# Patient Record
Sex: Male | Born: 1955 | Race: White | Hispanic: No | Marital: Single | Smoking: Former smoker
Health system: Southern US, Community
[De-identification: ages and names within clinical notes are randomized; demographics above are authoritative.]

## PROBLEM LIST (undated history)

## (undated) DIAGNOSIS — H269 Unspecified cataract: Secondary | ICD-10-CM

## (undated) DIAGNOSIS — E785 Hyperlipidemia, unspecified: Secondary | ICD-10-CM

## (undated) DIAGNOSIS — F419 Anxiety disorder, unspecified: Secondary | ICD-10-CM

## (undated) DIAGNOSIS — M4800 Spinal stenosis, site unspecified: Secondary | ICD-10-CM

## (undated) DIAGNOSIS — M5126 Other intervertebral disc displacement, lumbar region: Secondary | ICD-10-CM

## (undated) DIAGNOSIS — F32A Depression, unspecified: Secondary | ICD-10-CM

## (undated) DIAGNOSIS — T7840XA Allergy, unspecified, initial encounter: Secondary | ICD-10-CM

## (undated) DIAGNOSIS — I10 Essential (primary) hypertension: Secondary | ICD-10-CM

## (undated) DIAGNOSIS — K219 Gastro-esophageal reflux disease without esophagitis: Secondary | ICD-10-CM

## (undated) DIAGNOSIS — N400 Enlarged prostate without lower urinary tract symptoms: Secondary | ICD-10-CM

## (undated) DIAGNOSIS — M51369 Other intervertebral disc degeneration, lumbar region without mention of lumbar back pain or lower extremity pain: Secondary | ICD-10-CM

## (undated) DIAGNOSIS — F329 Major depressive disorder, single episode, unspecified: Secondary | ICD-10-CM

## (undated) DIAGNOSIS — K635 Polyp of colon: Secondary | ICD-10-CM

## (undated) DIAGNOSIS — M5136 Other intervertebral disc degeneration, lumbar region: Secondary | ICD-10-CM

## (undated) HISTORY — PX: WISDOM TOOTH EXTRACTION: SHX21

## (undated) HISTORY — PX: COLONOSCOPY: SHX174

## (undated) HISTORY — DX: Anxiety disorder, unspecified: F41.9

## (undated) HISTORY — DX: Allergy, unspecified, initial encounter: T78.40XA

## (undated) HISTORY — DX: Polyp of colon: K63.5

## (undated) HISTORY — DX: Unspecified cataract: H26.9

## (undated) HISTORY — PX: TYMPANIC MEMBRANE REPAIR: SHX294

## (undated) HISTORY — DX: Other intervertebral disc degeneration, lumbar region without mention of lumbar back pain or lower extremity pain: M51.369

## (undated) HISTORY — DX: Gastro-esophageal reflux disease without esophagitis: K21.9

## (undated) HISTORY — DX: Spinal stenosis, site unspecified: M48.00

## (undated) HISTORY — DX: Other intervertebral disc displacement, lumbar region: M51.26

## (undated) HISTORY — DX: Hyperlipidemia, unspecified: E78.5

## (undated) HISTORY — DX: Benign prostatic hyperplasia without lower urinary tract symptoms: N40.0

## (undated) HISTORY — DX: Other intervertebral disc degeneration, lumbar region: M51.36

## (undated) HISTORY — PX: EYE SURGERY: SHX253

## (undated) HISTORY — DX: Major depressive disorder, single episode, unspecified: F32.9

## (undated) HISTORY — DX: Essential (primary) hypertension: I10

## (undated) HISTORY — DX: Depression, unspecified: F32.A

## (undated) HISTORY — PX: REFRACTIVE SURGERY: SHX103

## (undated) HISTORY — PX: FRACTURE SURGERY: SHX138

---
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
## 2000-02-16 ENCOUNTER — Encounter: Admission: RE | Admit: 2000-02-16 | Discharge: 2000-02-16 | Payer: Self-pay | Admitting: *Deleted

## 2000-08-16 ENCOUNTER — Encounter: Admission: RE | Admit: 2000-08-16 | Discharge: 2000-08-16 | Payer: Self-pay

## 2003-12-31 ENCOUNTER — Ambulatory Visit: Payer: Self-pay | Admitting: Family Medicine

## 2004-11-29 ENCOUNTER — Ambulatory Visit: Payer: Self-pay | Admitting: Family Medicine

## 2004-12-08 ENCOUNTER — Ambulatory Visit: Payer: Self-pay | Admitting: Family Medicine

## 2005-12-21 ENCOUNTER — Ambulatory Visit: Payer: Self-pay | Admitting: Family Medicine

## 2005-12-21 LAB — CONVERTED CEMR LAB
ALT: 37 units/L (ref 0–40)
AST: 29 units/L (ref 0–37)
Albumin: 4.3 g/dL (ref 3.5–5.2)
Alkaline Phosphatase: 72 units/L (ref 39–117)
BUN: 12 mg/dL (ref 6–23)
Basophils Absolute: 0 10*3/uL (ref 0.0–0.1)
Basophils Relative: 0.6 % (ref 0.0–1.0)
CO2: 31 meq/L (ref 19–32)
Calcium: 9.7 mg/dL (ref 8.4–10.5)
Chloride: 101 meq/L (ref 96–112)
Chol/HDL Ratio, serum: 4.9
Cholesterol: 228 mg/dL (ref 0–200)
Creatinine, Ser: 1.1 mg/dL (ref 0.4–1.5)
Eosinophil percent: 1.9 % (ref 0.0–5.0)
GFR calc non Af Amer: 75 mL/min
Glomerular Filtration Rate, Af Am: 90 mL/min/{1.73_m2}
Glucose, Bld: 99 mg/dL (ref 70–99)
HCT: 45.7 % (ref 39.0–52.0)
HDL: 47 mg/dL (ref >39.0)
Hemoglobin: 15.7 g/dL (ref 13.0–17.0)
LDL DIRECT: 146.7 mg/dL
Lymphocytes Relative: 38.1 % (ref 12.0–46.0)
MCHC: 34.4 g/dL (ref 30.0–36.0)
MCV: 95.6 fL (ref 78.0–100.0)
Monocytes Absolute: 0.5 10*3/uL (ref 0.2–0.7)
Monocytes Relative: 9.6 % (ref 3.0–11.0)
Neutro Abs: 2.5 10*3/uL (ref 1.4–7.7)
Neutrophils Relative %: 49.8 % (ref 43.0–77.0)
PSA: 0.82 ng/mL (ref 0.10–4.00)
Platelets: 254 10*3/uL (ref 150–400)
Potassium: 3.7 meq/L (ref 3.5–5.1)
RBC: 4.78 M/uL (ref 4.22–5.81)
RDW: 12.4 % (ref 11.5–14.6)
Sodium: 142 meq/L (ref 135–145)
TSH: 1.46 microintl units/mL (ref 0.35–5.50)
Total Bilirubin: 1.1 mg/dL (ref 0.3–1.2)
Total Protein: 7.8 g/dL (ref 6.0–8.3)
Triglyceride fasting, serum: 251 mg/dL (ref 0–149)
VLDL: 50 mg/dL — ABNORMAL HIGH (ref 0–40)
WBC: 5 10*3/uL (ref 4.5–10.5)

## 2005-12-28 ENCOUNTER — Ambulatory Visit: Payer: Self-pay | Admitting: Family Medicine

## 2006-01-18 ENCOUNTER — Ambulatory Visit: Payer: Self-pay | Admitting: Family Medicine

## 2006-09-27 DIAGNOSIS — E785 Hyperlipidemia, unspecified: Secondary | ICD-10-CM | POA: Insufficient documentation

## 2006-09-27 DIAGNOSIS — F329 Major depressive disorder, single episode, unspecified: Secondary | ICD-10-CM

## 2006-09-27 DIAGNOSIS — F32A Depression, unspecified: Secondary | ICD-10-CM | POA: Insufficient documentation

## 2006-09-27 DIAGNOSIS — K219 Gastro-esophageal reflux disease without esophagitis: Secondary | ICD-10-CM | POA: Insufficient documentation

## 2006-09-27 DIAGNOSIS — F419 Anxiety disorder, unspecified: Secondary | ICD-10-CM | POA: Insufficient documentation

## 2006-09-27 DIAGNOSIS — I10 Essential (primary) hypertension: Secondary | ICD-10-CM | POA: Insufficient documentation

## 2006-12-27 ENCOUNTER — Ambulatory Visit: Payer: Self-pay | Admitting: Family Medicine

## 2006-12-27 LAB — CONVERTED CEMR LAB
Bilirubin Urine: NEGATIVE
Blood in Urine, dipstick: NEGATIVE
Glucose, Urine, Semiquant: NEGATIVE
Ketones, urine, test strip: NEGATIVE
Nitrite: NEGATIVE
Specific Gravity, Urine: 1.015
Urobilinogen, UA: 0.2
WBC Urine, dipstick: NEGATIVE
pH: 6

## 2007-01-04 ENCOUNTER — Ambulatory Visit: Payer: Self-pay | Admitting: Family Medicine

## 2007-01-04 DIAGNOSIS — F528 Other sexual dysfunction not due to a substance or known physiological condition: Secondary | ICD-10-CM | POA: Insufficient documentation

## 2007-01-08 LAB — CONVERTED CEMR LAB
ALT: 29 units/L (ref 0–53)
AST: 23 units/L (ref 0–37)
Albumin: 4 g/dL (ref 3.5–5.2)
Alkaline Phosphatase: 73 units/L (ref 39–117)
BUN: 13 mg/dL (ref 6–23)
Basophils Absolute: 0 10*3/uL (ref 0.0–0.1)
Basophils Relative: 0.5 % (ref 0.0–1.0)
Bilirubin, Direct: 0.2 mg/dL (ref 0.0–0.3)
CO2: 31 meq/L (ref 19–32)
Calcium: 9.7 mg/dL (ref 8.4–10.5)
Chloride: 99 meq/L (ref 96–112)
Cholesterol: 208 mg/dL (ref 0–200)
Creatinine, Ser: 1 mg/dL (ref 0.4–1.5)
Direct LDL: 140.6 mg/dL
Eosinophils Absolute: 0.2 10*3/uL (ref 0.0–0.6)
Eosinophils Relative: 3.4 % (ref 0.0–5.0)
GFR calc Af Amer: 101 mL/min
GFR calc non Af Amer: 83 mL/min
Glucose, Bld: 101 mg/dL — ABNORMAL HIGH (ref 70–99)
HCT: 43.3 % (ref 39.0–52.0)
HDL: 36.2 mg/dL — ABNORMAL LOW (ref >39.0)
Hemoglobin: 15.2 g/dL (ref 13.0–17.0)
Lymphocytes Relative: 36 % (ref 12.0–46.0)
MCHC: 35.1 g/dL (ref 30.0–36.0)
MCV: 95 fL (ref 78.0–100.0)
Monocytes Absolute: 0.6 10*3/uL (ref 0.2–0.7)
Monocytes Relative: 8.1 % (ref 3.0–11.0)
Neutro Abs: 3.7 10*3/uL (ref 1.4–7.7)
Neutrophils Relative %: 52 % (ref 43.0–77.0)
PSA: 0.96 ng/mL (ref 0.10–4.00)
Platelets: 266 10*3/uL (ref 150–400)
Potassium: 4 meq/L (ref 3.5–5.1)
RBC: 4.56 M/uL (ref 4.22–5.81)
RDW: 12.3 % (ref 11.5–14.6)
Sodium: 140 meq/L (ref 135–145)
TSH: 1.49 microintl units/mL (ref 0.35–5.50)
Total Bilirubin: 1.1 mg/dL (ref 0.3–1.2)
Total CHOL/HDL Ratio: 5.7
Total Protein: 6.8 g/dL (ref 6.0–8.3)
Triglycerides: 126 mg/dL (ref 0–149)
VLDL: 25 mg/dL (ref 0–40)
WBC: 7.1 10*3/uL (ref 4.5–10.5)

## 2007-03-14 ENCOUNTER — Ambulatory Visit: Payer: Self-pay | Admitting: Family Medicine

## 2007-03-14 DIAGNOSIS — K921 Melena: Secondary | ICD-10-CM | POA: Insufficient documentation

## 2007-03-22 ENCOUNTER — Encounter: Payer: Self-pay | Admitting: Family Medicine

## 2007-09-20 ENCOUNTER — Emergency Department (HOSPITAL_COMMUNITY): Admission: EM | Admit: 2007-09-20 | Discharge: 2007-09-20 | Payer: Self-pay | Admitting: Emergency Medicine

## 2007-10-04 ENCOUNTER — Ambulatory Visit: Payer: Self-pay | Admitting: Family Medicine

## 2007-10-04 DIAGNOSIS — F411 Generalized anxiety disorder: Secondary | ICD-10-CM | POA: Insufficient documentation

## 2007-10-18 ENCOUNTER — Ambulatory Visit: Payer: Self-pay | Admitting: Family Medicine

## 2007-10-29 ENCOUNTER — Telehealth: Payer: Self-pay | Admitting: Family Medicine

## 2007-10-29 DIAGNOSIS — S335XXA Sprain of ligaments of lumbar spine, initial encounter: Secondary | ICD-10-CM | POA: Insufficient documentation

## 2007-11-22 ENCOUNTER — Telehealth: Payer: Self-pay | Admitting: Family Medicine

## 2007-11-26 ENCOUNTER — Telehealth (INDEPENDENT_AMBULATORY_CARE_PROVIDER_SITE_OTHER): Payer: Self-pay | Admitting: *Deleted

## 2007-12-05 ENCOUNTER — Encounter: Payer: Self-pay | Admitting: Family Medicine

## 2008-01-08 ENCOUNTER — Ambulatory Visit: Payer: Self-pay | Admitting: Family Medicine

## 2008-01-08 LAB — CONVERTED CEMR LAB
Blood in Urine, dipstick: NEGATIVE
Ketones, urine, test strip: NEGATIVE
Nitrite: NEGATIVE
Protein, U semiquant: NEGATIVE
Specific Gravity, Urine: 1.02
Urobilinogen, UA: 0.2

## 2008-01-09 ENCOUNTER — Encounter: Admission: RE | Admit: 2008-01-09 | Discharge: 2008-01-29 | Payer: Self-pay | Admitting: Specialist

## 2008-01-17 ENCOUNTER — Ambulatory Visit: Payer: Self-pay | Admitting: Family Medicine

## 2008-01-21 LAB — CONVERTED CEMR LAB
AST: 35 units/L (ref 0–37)
Alkaline Phosphatase: 67 units/L (ref 39–117)
BUN: 9 mg/dL (ref 6–23)
Basophils Absolute: 0 10*3/uL (ref 0.0–0.1)
Chloride: 105 meq/L (ref 96–112)
Eosinophils Absolute: 0.2 10*3/uL (ref 0.0–0.7)
Eosinophils Relative: 3.1 % (ref 0.0–5.0)
GFR calc non Af Amer: 93 mL/min
HDL: 46.4 mg/dL (ref >39.0)
MCV: 95.2 fL (ref 78.0–100.0)
Neutrophils Relative %: 47.8 % (ref 43.0–77.0)
Platelets: 221 10*3/uL (ref 150–400)
Potassium: 3.6 meq/L (ref 3.5–5.1)
Total Bilirubin: 0.9 mg/dL (ref 0.3–1.2)
Total CHOL/HDL Ratio: 4.7
Triglycerides: 217 mg/dL (ref 0–149)
VLDL: 43 mg/dL — ABNORMAL HIGH (ref 0–40)
WBC: 5.4 10*3/uL (ref 4.5–10.5)

## 2008-02-12 ENCOUNTER — Encounter: Admission: RE | Admit: 2008-02-12 | Discharge: 2008-03-26 | Payer: Self-pay | Admitting: Specialist

## 2008-02-21 ENCOUNTER — Encounter: Payer: Self-pay | Admitting: Family Medicine

## 2008-12-09 ENCOUNTER — Telehealth: Payer: Self-pay | Admitting: Family Medicine

## 2009-02-03 ENCOUNTER — Ambulatory Visit: Payer: Self-pay | Admitting: Family Medicine

## 2009-02-11 ENCOUNTER — Ambulatory Visit: Payer: Self-pay | Admitting: Family Medicine

## 2009-02-11 DIAGNOSIS — B354 Tinea corporis: Secondary | ICD-10-CM | POA: Insufficient documentation

## 2009-02-17 LAB — CONVERTED CEMR LAB
ALT: 36 units/L (ref 0–53)
Alkaline Phosphatase: 76 units/L (ref 39–117)
Basophils Relative: 0.8 % (ref 0.0–3.0)
Bilirubin Urine: NEGATIVE
Bilirubin, Direct: 0.2 mg/dL (ref 0.0–0.3)
CO2: 32 meq/L (ref 19–32)
Chloride: 102 meq/L (ref 96–112)
Eosinophils Absolute: 0.3 10*3/uL (ref 0.0–0.7)
Eosinophils Relative: 4 % (ref 0.0–5.0)
Glucose, Bld: 115 mg/dL — ABNORMAL HIGH (ref 70–99)
HDL: 49.3 mg/dL (ref >39.00)
Lymphocytes Relative: 35.7 % (ref 12.0–46.0)
Neutrophils Relative %: 51.1 % (ref 43.0–77.0)
Nitrite: NEGATIVE
Potassium: 4.2 meq/L (ref 3.5–5.1)
RBC: 4.99 M/uL (ref 4.22–5.81)
Sodium: 142 meq/L (ref 135–145)
Specific Gravity, Urine: 1.02 (ref 1.000–1.030)
Total Bilirubin: 1.2 mg/dL (ref 0.3–1.2)
Total Protein: 7.9 g/dL (ref 6.0–8.3)
VLDL: 24.2 mg/dL (ref 0.0–40.0)
WBC: 7 10*3/uL (ref 4.5–10.5)
pH: 6 (ref 5.0–8.0)

## 2009-02-25 ENCOUNTER — Ambulatory Visit: Payer: Self-pay | Admitting: Family Medicine

## 2009-02-25 LAB — CONVERTED CEMR LAB
OCCULT 1: NEGATIVE
OCCULT 2: NEGATIVE
OCCULT 3: NEGATIVE

## 2009-03-10 ENCOUNTER — Telehealth: Payer: Self-pay | Admitting: Family Medicine

## 2009-03-26 ENCOUNTER — Encounter: Payer: Self-pay | Admitting: Family Medicine

## 2009-06-30 ENCOUNTER — Telehealth: Payer: Self-pay | Admitting: Family Medicine

## 2009-07-13 ENCOUNTER — Ambulatory Visit: Payer: Self-pay | Admitting: Family Medicine

## 2009-08-12 ENCOUNTER — Telehealth: Payer: Self-pay | Admitting: Family Medicine

## 2009-08-16 LAB — CONVERTED CEMR LAB
Direct LDL: 115.8 mg/dL
HDL: 51.6 mg/dL (ref >39.00)

## 2009-09-13 ENCOUNTER — Ambulatory Visit: Payer: Self-pay | Admitting: Diagnostic Radiology

## 2009-09-13 ENCOUNTER — Emergency Department (HOSPITAL_BASED_OUTPATIENT_CLINIC_OR_DEPARTMENT_OTHER): Admission: EM | Admit: 2009-09-13 | Discharge: 2009-09-13 | Payer: Self-pay | Admitting: Emergency Medicine

## 2010-01-13 ENCOUNTER — Telehealth: Payer: Self-pay | Admitting: Family Medicine

## 2010-01-21 ENCOUNTER — Telehealth: Payer: Self-pay | Admitting: Family Medicine

## 2010-02-23 ENCOUNTER — Telehealth: Payer: Self-pay | Admitting: Family Medicine

## 2010-03-02 NOTE — Assessment & Plan Note (Signed)
Summary: cpx/cjr   Vital Signs:  Patient profile:   55 year old male Height:      71 inches Weight:      238 pounds BMI:     33.31 O2 Sat:      96 % Temp:     98.1 degrees F Pulse rate:   80 / minute BP sitting:   120 / 80  (left arm) Cuff size:   large  Vitals Entered By: Pura Spice, RN (February 11, 2009 10:35 AM) CC: cpx  Is Patient Diabetic? No Pain Assessment Patient in pain? no        History of Present Illness: This 55 year old white male is in for complete physical examination.Marland Kitchen He is a known hypertensive which is well controlled../In general he relates he had been doing for well and does have a circular rash scaly ringworm type rash over the right anterior chest. 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 He relates his anxiety depression which is associated with his dizziness is under control with the bupropion and alprazolam. He stopped his simvastatin because it was causing muscular aches and pains and after stopping the medication and when away and we will change to another medication  Allergies (verified): No Known Drug Allergies  Past History:  Past Medical History: Last updated: 09/27/2006 Depression GERD Hyperlipidemia Hypertension  Past Surgical History: Last updated: 09/27/2006 Broken nose-1974  Review of Systems      See HPI General:  See HPI; Denies chills, fatigue, fever, loss of appetite, malaise, sleep disorder, sweats, weakness, and weight loss. Eyes:  Denies blurring, discharge, double vision, eye irritation, eye pain, halos, itching, light sensitivity, red eye, vision loss-1 eye, and vision loss-both eyes. ENT:  Denies decreased hearing, difficulty swallowing, ear discharge, earache, hoarseness, nasal congestion, nosebleeds, postnasal drainage, ringing in ears, sinus pressure, and sore throat. CV:  Denies bluish discoloration of lips  or nails, chest pain or discomfort, difficulty breathing at night, difficulty breathing while lying down, fainting, fatigue, leg cramps with exertion, lightheadness, near fainting, palpitations, shortness of breath with exertion, swelling of feet, swelling of hands, and weight gain; blood pressure is stable. Resp:  Denies chest discomfort, chest pain with inspiration, cough, coughing up blood, excessive snoring, hypersomnolence, morning headaches, pleuritic, shortness of breath, sputum productive, and wheezing. GI:  Complains of indigestion; denies abdominal pain, bloody stools, change in bowel habits, constipation, dark tarry stools, diarrhea, excessive appetite, gas, hemorrhoids, loss of appetite, nausea, vomiting, vomiting blood, and yellowish skin color; controll with omeprazole. GU:  Complains of erectile dysfunction. MS:  See HPI; Complains of muscle aches. Derm:  See HPI; Complains of rash. Neuro:  Denies brief paralysis, difficulty with concentration, disturbances in coordination, falling down, headaches, inability to speak, memory loss, numbness, poor balance, seizures, sensation of room spinning, tingling, tremors, visual disturbances, and weakness. Psych:  Complains of anxiety and depression. Endo:  Denies cold intolerance, excessive hunger, excessive thirst, excessive urination, heat intolerance, polyuria, and weight change.  Physical Exam  General:  Well-developed,well-nourished,in no acute distress; alert,appropriate and cooperative throughout examination Head:  Normocephalic and atraumatic without obvious abnormalities. No apparent alopecia or balding. Eyes:  No corneal or conjunctival inflammation noted. EOMI. Perrla. Funduscopic exam benign, without hemorrhages, exudates or papilledema. Vision grossly normal. Ears:  External ear exam shows no significant lesions or deformities.  Otoscopic examination reveals clear canals, tympanic membranes  are intact bilaterally without bulging,  retraction, inflammation or discharge. Hearing is grossly normal bilaterally. Nose:  External nasal examination shows no deformity or inflammation. Nasal mucosa are pink and moist without lesions or exudates. Mouth:  Oral mucosa and oropharynx without lesions or exudates.  Teeth in good repair. Neck:  No deformities, masses, or tenderness noted. Chest Wall:  No deformities, masses, tenderness or gynecomastia noted. Breasts:  No masses or gynecomastia noted Lungs:  Normal respiratory effort, chest expands symmetrically. Lungs are clear to auscultation, no crackles or wheezes. Heart:  Normal rate and regular rhythm. S1 and S2 normal without gallop, murmur, click, rub or other extra sounds. electrocardiogram is normal Abdomen:  Bowel sounds positive,abdomen soft and non-tender without masses, organomegaly or hernias noted. Rectal:  No external abnormalities noted. Normal sphincter tone. No rectal masses or tenderness. Genitalia:  Testes bilaterally descended without nodularity, tenderness or masses. No scrotal masses or lesions. No penis lesions or urethral discharge. Prostate:  Prostate gland firm and smooth, no enlargement, nodularity, tenderness, mass, asymmetry or induration. Msk:  No deformity or scoliosis noted of thoracic or lumbar spine.   Pulses:  R and L carotid,radial,femoral,dorsalis pedis and posterior tibial pulses are full and equal bilaterally Extremities:  No clubbing, cyanosis, edema, or deformity noted with normal full range of motion of all joints.   Neurologic:  No cranial nerve deficits noted. Station and gait are normal. Plantar reflexes are down-going bilaterally. DTRs are symmetrical throughout. Sensory, motor and coordinative functions appear intact. Skin:  2 cm circular erythematous scaly rash right anterior chest Cervical Nodes:  No lymphadenopathy noted Axillary Nodes:  No palpable lymphadenopathy Inguinal Nodes:  No significant adenopathy Psych:  Cognition and judgment  appear intact. Alert and cooperative with normal attention span and concentration. 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  Orders: Prescription Created Electronically [phone])  Problem # 3:  STRESS DISORDER (ICD-V62.89) Assessment: Improved  Problem # 4:  ERECTILE DYSFUNCTION (ICD-302.72) Assessment: Improved  His updated medication list for this problem includes:    Viagra 100 Mg Tabs (Sildenafil citrate) .Marland Kitchen... Take as directed  Problem # 5:  HYPERTENSION (ICD-401.9) Assessment: Improved  His updated medication list for this problem includes:    Triamterene-hctz 37.5-25 Mg Tabs (Triamterene-hctz) ..... Once daily    Bystolic 10 Mg Tabs (Nebivolol hcl) .Marland Kitchen... 1 once daily for blood pressure  Orders: EKG w/ Interpretation (93000)  Problem # 6:  HYPERLIPIDEMIA (ICD-272.4) Assessment: Improved  The following medications were removed from the medication list:    Simvastatin 80 Mg Tabs (Simvastatin) .Marland Kitchen... 1 hs for lipids His updated medication list for this problem includes:    Crestor 10 Mg Tabs (Rosuvastatin calcium) ..... One q.d.  Orders: EKG w/ Interpretation (93000)  Problem # 7:  GERD (ICD-530.81) Assessment: Improved  His updated medication list for this problem includes:    Kls Omeprazole 20 Mg Tbec (Omeprazole) .Marland Kitchen... 1 qd  Complete Medication List: 1)  Triamterene-hctz 37.5-25 Mg Tabs (Triamterene-hctz) .... Once daily 2)  Viagra 100 Mg Tabs (Sildenafil citrate) .... Take as directed 3)  Budeprion Xl 300 Mg Tb24 (Bupropion hcl) .... Once daily 4)  Alprazolam 0.5 Mg Tabs (Alprazolam) .Marland Kitchen.. 1 morning ,mid afternoon and bedtime for stress 5)  Bystolic 10 Mg Tabs (Nebivolol hcl) .Marland Kitchen.. 1 once daily for blood pressure 6)  Oxycodone-acetaminophen 5-325 Mg Tabs  (Oxycodone-acetaminophen) .Marland KitchenMarland KitchenMarland Kitchen  1-2 q4h  as needed pain 7)  Kls Omeprazole 20 Mg Tbec (Omeprazole) .Marland Kitchen.. 1 qd 8)  Adult Aspirin Ec Low Strength 81 Mg Tbec (Aspirin) .Marland Kitchen.. 1 qd 9)  Clotrimazole-betamethasone 1-0.05 % Crea (Clotrimazole-betamethasone) .... Apply two times a day to rash 10)  Crestor 10 Mg Tabs (Rosuvastatin calcium) .... One q.d.  Other Orders: Admin 1st Vaccine (06301) Flu Vaccine 66yrs + (60109) Flu Vaccine Consent Questions     Do you have a history of severe allergic reactions to this vaccine? no    Any prior history of allergic reactions to egg and/or gelatin? no    Do you have a sensitivity to the preservative Thimersol? no    Do you have a past history of Guillan-Barre Syndrome? no    Do you currently have an acute febrile illness? no    Have you ever had a severe reaction to latex? no    Vaccine information given and explained to patient? yes    Are you currently pregnant? no    Lot Number:[id number]   Exp Date:07/30/2009   Site Given  right  Deltoid IM.....ghrn            Patient Instructions: 1)  recommend attempting to lose weight, in fact you gained 4 pounds since the last office visi Take medications as prescribed 2)  D. simvastatin causes muscle aches would change to Crestor or Lipitor and recheck lipids in 3 months 3)  For fungal infection of the chest area we'll treat with Lotrisone  4)  Twice daily for 2 weeks, repeat if recurs Prescriptions: OXYCODONE-ACETAMINOPHEN 5-325 MG TABS (OXYCODONE-ACETAMINOPHEN) 1-2 q4h  as needed pain  #100 x 0   Entered and Authorized by:   Judithann Sheen MD   Signed by:   Judithann Sheen MD on 02/11/2009   Method used:   Print then Give to Patient   RxID:   [id number] ALPRAZOLAM 0.5 MG TABS (ALPRAZOLAM) 1 morning ,mid afternoon and bedtime for stress  #90 x 5   Entered and Authorized by:   Judithann Sheen MD   Signed by:   Judithann Sheen MD on 02/11/2009   Method used:   Print then Give to  Patient   RxID:   [id number] CLOTRIMAZOLE-BETAMETHASONE 1-0.05 % CREA (CLOTRIMAZOLE-BETAMETHASONE) APPLY two times a day TO RASH  #45 GMS x 5   Entered and Authorized by:   Judithann Sheen MD   Signed by:   Judithann Sheen MD on 02/11/2009   Method used:   Electronically to        [facility] (retail)       [address]       [place], [place]  [address]       Ph: [phone]       Fax: [phone]   RxID:   [id number] BYSTOLIC 10 MG TABS (NEBIVOLOL HCL) 1 once daily for blood pressure Brand medically necessary #30 x 11   Entered and Authorized by:   Judithann Sheen MD   Signed by:   Judithann Sheen MD on 02/11/2009   Method used:   Electronically to        [facility] (retail)       [address]       [place], [place]  [address]       Ph:  [phone]       Fax: [phone]   RxID:   [id number] BUDEPRION XL 300 MG  TB24 (BUPROPION HCL) once daily  #30 x 11   Entered and Authorized by:   Judithann Sheen MD   Signed by:   Judithann Sheen MD on 02/11/2009   Method used:   Electronically to        [facility] (retail)       [address]       [place], [place]  [address]       Ph: [phone]       Fax: [phone]   RxID:   [id number] VIAGRA 100 MG  TABS (SILDENAFIL CITRATE) take as directed  #10 x 6   Entered and Authorized by:   Judithann Sheen MD   Signed by:   Judithann Sheen MD on 02/11/2009   Method used:   Electronically to        [facility] (retail)       [address]       [place], [place]  [address]       Ph: [phone]       Fax: [phone]   RxID:   [id number] TRIAMTERENE-HCTZ 37.5-25 MG TABS (TRIAMTERENE-HCTZ) once daily  #30 x 11   Entered and Authorized by:   Judithann Sheen MD   Signed by:   Judithann Sheen MD on  02/11/2009   Method used:   Electronically to        [facility] (retail)       [address]       [place], [place]  [address]       Ph: [phone]       Fax: [phone]   RxID:   [id number]   .lbflu

## 2010-03-02 NOTE — Progress Notes (Signed)
Summary: change from traimtere to HCTZ until triamtere returns   Phone Note From Pharmacy   Caller: [facility] Summary of Call: fax repoert received to say that traimtere /hctz having difficulty in delays of production.  need alternative med  Initial call taken by: Pura Spice, RN,  Jun 30, 2009 3:32 PM  Follow-up for Phone Call        per dr Scotty Court start on hctz 25 mg until able to get triamtere/hctz  Follow-up by: Pura Spice, RN,  Jun 30, 2009 3:33 PM    New/Updated Medications: HYDROCHLOROTHIAZIDE 25 MG TABS (HYDROCHLOROTHIAZIDE) 1 by mouth every day Prescriptions: HYDROCHLOROTHIAZIDE 25 MG TABS (HYDROCHLOROTHIAZIDE) 1 by mouth every day  #3066 x 6   Entered by:   Pura Spice, RN   Authorized by:   Judithann Sheen MD   Signed by:   Pura Spice, RN on 06/30/2009   Method used:   Printed then faxed to ...       [facility] (retail)       [address]       [place], [place]  [address]       Ph: [phone]       Fax: [phone]   RxID:   [id number]

## 2010-03-02 NOTE — Progress Notes (Signed)
Summary: LAB RESULTS  Phone Note Call from Patient Call back at Home Phone [phone]   Caller: Patient Call For: Judithann Sheen MD Summary of Call: PT WOULD LIKE LAB RESULTS Initial call taken by: Heron Sabins,  August 12, 2009 12:22 PM  Follow-up for Phone Call        per dr Scotty Court labs "looked good"   pt notiifed.  Follow-up by: Pura Spice, RN,  August 13, 2009 9:10 AM

## 2010-03-02 NOTE — Procedures (Signed)
Summary: Colonoscopy Report/Dr. Sharrell Ku  Colonoscopy Report/Dr. Sharrell Ku   Imported By: Maryln Gottron 05/29/2009 14:24:29  _____________________________________________________________________  External Attachment:    Type:   Image     Comment:   External Document

## 2010-03-02 NOTE — Progress Notes (Signed)
Summary: questions samples crestor   Phone Note Call from Patient Call back at Home Phone [phone]   Caller: Patient via vm Call For: Judithann Sheen MD Summary of Call: has three things to address:  1. colon test results                                                2. out of samples for cholesterol med.  You were considering new lipitor.  Please call to pharmacy.                                                3. need clarification of 6 month FU--does he need lab and rov or just lab Please call. Initial call taken by: Gladis Riffle, RN,  March 10, 2009 1:17 PM  Follow-up for Phone Call        notified pt hemocult cards neg x 3 on crestor 10 mg since was given samples and he wants to cont. and to reck lipids hepatic 3 months . pt verbalized understanding  Follow-up by: Pura Spice, RN,  March 12, 2009 12:07 PM    New/Updated Medications: CRESTOR 10 MG TABS (ROSUVASTATIN CALCIUM) one q.d.

## 2010-03-04 NOTE — Progress Notes (Signed)
Summary: class 2 med  Phone Note From Pharmacy   Caller: [facility] Call For: rachel  Reason for Call: Allergy Alert Summary of Call: endocet can not be call into pharm class2. Initial call taken by: Heron Sabins,  January 21, 2010 12:12 PM  Follow-up for Phone Call        pt is aware and will pick up rx @ office. Follow-up by: Warnell Forester,  January 21, 2010 4:09 PM    Prescriptions: ENDOCET 5-325 MG TABS (OXYCODONE-ACETAMINOPHEN) take one tab by mouth three times a day as needed  #60 x 0   Entered by:   Kern Reap CMA (AAMA)   Authorized by:   Judithann Sheen MD   Signed by:   Kern Reap CMA Duncan Dull) on 01/21/2010   Method used:   Print then Give to Patient   RxID:   [id number]

## 2010-03-04 NOTE — Progress Notes (Signed)
Summary: rx request  Phone Note Call from Patient Call back at Home Phone [phone]   Caller: Patient Call For: Judithann Sheen MD Summary of Call: [facility] refill 5/[facility]   Initial call taken by: [facility] CMA AAMA,  January 13, 2010 4:22 PM  Follow-up for Phone Call        ok per dr Scotty Court Follow-up by: Kern Reap CMA Duncan Dull),  January 21, 2010 9:24 AM    New/Updated Medications: ENDOCET 5-325 MG TABS (OXYCODONE-ACETAMINOPHEN) take one tab by mouth three times a day as needed Prescriptions: ENDOCET 5-325 MG TABS (OXYCODONE-ACETAMINOPHEN) take one tab by mouth three times a day as needed  #60 x 0   Entered by:   Kern Reap CMA (AAMA)   Authorized by:   Judithann Sheen MD   Signed by:   Kern Reap CMA (AAMA) on 01/21/2010   Method used:   Telephoned to ...       [facility] (retail)       [address]       [place], [place]  [address]       Ph: [phone]       Fax: [phone]   RxID:   [id number]

## 2010-03-04 NOTE — Progress Notes (Signed)
Summary: refill request  Phone Note Refill Request Message from:  Fax from Pharmacy on February 23, 2010 3:00 PM  Refills Requested: Medication #1:  TRIAMTERENE-HCTZ 37.5-25 MG TABS once daily Initial call taken by: Kern Reap CMA (AAMA),  February 23, 2010 3:24 PM    Prescriptions: TRIAMTERENE-HCTZ 37.5-25 MG TABS (TRIAMTERENE-HCTZ) once daily  #30 x 5   Entered by:   Kern Reap CMA (AAMA)   Authorized by:   Judithann Sheen MD   Signed by:   Kern Reap CMA (AAMA) on 02/23/2010   Method used:   Re-Faxed to ...       [facility] (retail)       [address]       [place], [place]  [address]       Ph: [phone]       Fax: [phone]   RxID:   [id number]

## 2010-03-25 ENCOUNTER — Other Ambulatory Visit: Payer: Self-pay | Admitting: Family Medicine

## 2010-03-30 ENCOUNTER — Other Ambulatory Visit (INDEPENDENT_AMBULATORY_CARE_PROVIDER_SITE_OTHER): Payer: PRIVATE HEALTH INSURANCE | Admitting: Family Medicine

## 2010-03-30 DIAGNOSIS — Z0000 Encounter for general adult medical examination without abnormal findings: Secondary | ICD-10-CM

## 2010-03-30 LAB — LIPID PANEL
Cholesterol: 176 mg/dL (ref 0–200)
HDL: 46.6 mg/dL (ref >39.00)
LDL Cholesterol: 91 mg/dL (ref 0–99)
Triglycerides: 193 mg/dL — ABNORMAL HIGH (ref 0.0–149.0)
VLDL: 38.6 mg/dL (ref 0.0–40.0)

## 2010-03-30 LAB — HEPATIC FUNCTION PANEL
AST: 27 U/L (ref 0–37)
Total Bilirubin: 1 mg/dL (ref 0.3–1.2)

## 2010-03-30 LAB — CBC WITH DIFFERENTIAL/PLATELET
Basophils Absolute: 0.1 10*3/uL (ref 0.0–0.1)
Eosinophils Absolute: 0.3 10*3/uL (ref 0.0–0.7)
HCT: 45.9 % (ref 39.0–52.0)
Lymphs Abs: 2.7 10*3/uL (ref 0.7–4.0)
MCHC: 35.1 g/dL (ref 30.0–36.0)
Monocytes Relative: 7.2 % (ref 3.0–12.0)
Platelets: 231 10*3/uL (ref 150.0–400.0)
RDW: 13.1 % (ref 11.5–14.6)

## 2010-03-30 LAB — BASIC METABOLIC PANEL
BUN: 15 mg/dL (ref 6–23)
Calcium: 9.5 mg/dL (ref 8.4–10.5)
GFR: 75.87 mL/min (ref >60.00)
Glucose, Bld: 104 mg/dL — ABNORMAL HIGH (ref 70–99)
Potassium: 3.9 mEq/L (ref 3.5–5.1)

## 2010-03-30 LAB — POCT URINALYSIS DIPSTICK
Bilirubin, UA: NEGATIVE
Blood, UA: NEGATIVE
Ketones, UA: NEGATIVE
Spec Grav, UA: 1.015
pH, UA: 6.5

## 2010-04-06 ENCOUNTER — Ambulatory Visit (INDEPENDENT_AMBULATORY_CARE_PROVIDER_SITE_OTHER): Payer: PRIVATE HEALTH INSURANCE | Admitting: Family Medicine

## 2010-04-06 ENCOUNTER — Encounter: Payer: Self-pay | Admitting: Family Medicine

## 2010-04-06 VITALS — BP 130/80 | HR 83 | Temp 98.6°F | Ht 70.0 in | Wt 240.0 lb

## 2010-04-06 DIAGNOSIS — N529 Male erectile dysfunction, unspecified: Secondary | ICD-10-CM

## 2010-04-06 DIAGNOSIS — I10 Essential (primary) hypertension: Secondary | ICD-10-CM

## 2010-04-06 DIAGNOSIS — Z0000 Encounter for general adult medical examination without abnormal findings: Secondary | ICD-10-CM

## 2010-04-06 DIAGNOSIS — E785 Hyperlipidemia, unspecified: Secondary | ICD-10-CM

## 2010-04-06 MED ORDER — HYDROCODONE-ACETAMINOPHEN 10-325 MG PO TABS: 1.0000 | ORAL_TABLET | ORAL | Status: DC | PRN

## 2010-04-06 MED ORDER — ALPRAZOLAM 0.5 MG PO TABS: 0.5000 mg | ORAL_TABLET | Freq: Every evening | ORAL | Status: DC | PRN

## 2010-04-06 MED ORDER — OMEPRAZOLE 20 MG PO TBEC: 1.0000 | DELAYED_RELEASE_TABLET | Freq: Every day | ORAL | Status: DC

## 2010-04-06 NOTE — Patient Instructions (Signed)
You are doing fine, continue your exercise, dieting Continue present medictine as prescribed return prn

## 2010-04-12 ENCOUNTER — Telehealth: Payer: Self-pay | Admitting: Family Medicine

## 2010-04-12 NOTE — Telephone Encounter (Signed)
Pt needs refill on xanax .5mg  [facility] [phone]

## 2010-04-12 NOTE — Telephone Encounter (Signed)
Pharmacy called to verify directions on pt rx

## 2010-04-13 ENCOUNTER — Other Ambulatory Visit: Payer: Self-pay

## 2010-04-13 ENCOUNTER — Telehealth: Payer: Self-pay

## 2010-04-13 MED ORDER — ALPRAZOLAM 0.5 MG PO TABS: 0.5000 mg | ORAL_TABLET | Freq: Every evening | ORAL | Status: DC | PRN

## 2010-04-13 NOTE — Telephone Encounter (Signed)
Pt aware.

## 2010-04-14 NOTE — Progress Notes (Signed)
  Subjective:    Patient ID: Antonio Cooper, male    DOB: [birth year], 55 y.o.   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   HENT: Negative.   Eyes: Negative.   Respiratory: Negative.   Cardiovascular: Negative.        Hypertension good control  Gastrointestinal: Negative.        Afterward but his control  Genitourinary: Negative.        Erectile dysfunction uses Viagra  Musculoskeletal: Negative.   Skin: Negative.   Neurological: Negative.   Hematological: Negative.   Psychiatric/Behavioral:       Anxiety and depression under good control       Objective:   Physical Exam the patient is well well well nourished slightly overweight white male HEENT negative carotid pulses good thyroid is normal Lungs are clear to palpation percussion and auscultation no rales no wheeze and Heart no evidence of cardiomegaly heart sound 2 without murmurs Abdomen liver spleen kidneys are nonpalpable no masses felt aortic percussion is normal Inguinal regions are negative no hernias Genitalia normal testicles normal, rectal prostate within normal size no masses no abnormalities noted Extremity examination mobile some tenderness over both knees Examination the skin revealed several small hyperkeratotic lesions on the face Neurological examination negative EKG was negative       Assessment & Plan:  Patient is doing her well blood pressure control hyperlipidemia is under control electrocardiogram was normal he is doing her well under medication that is prescribed at this time

## 2010-04-16 ENCOUNTER — Telehealth: Payer: Self-pay | Admitting: Family Medicine

## 2010-04-16 LAB — COMPREHENSIVE METABOLIC PANEL
ALT: 23 U/L (ref 0–53)
Alkaline Phosphatase: 106 U/L (ref 39–117)
BUN: 14 mg/dL (ref 6–23)
CO2: 26 mEq/L (ref 19–32)
Chloride: 97 mEq/L (ref 96–112)
GFR calc non Af Amer: >60 mL/min (ref >60)
Glucose, Bld: 124 mg/dL — ABNORMAL HIGH (ref 70–99)
Potassium: 3.8 mEq/L (ref 3.5–5.1)
Sodium: 138 mEq/L (ref 135–145)
Total Bilirubin: 1.6 mg/dL — ABNORMAL HIGH (ref 0.3–1.2)
Total Protein: 9.1 g/dL — ABNORMAL HIGH (ref 6.0–8.3)

## 2010-04-16 LAB — DIFFERENTIAL
Basophils Absolute: 0.2 10*3/uL — ABNORMAL HIGH (ref 0.0–0.1)
Eosinophils Absolute: 0.2 10*3/uL (ref 0.0–0.7)
Lymphs Abs: 2.3 10*3/uL (ref 0.7–4.0)
Monocytes Absolute: 1 10*3/uL (ref 0.1–1.0)
Neutrophils Relative %: 81 % — ABNORMAL HIGH (ref 43–77)

## 2010-04-16 LAB — CBC
HCT: 47.3 % (ref 39.0–52.0)
Hemoglobin: 16.4 g/dL (ref 13.0–17.0)
MCV: 96.1 fL (ref 78.0–100.0)
RDW: 12.4 % (ref 11.5–15.5)
WBC: 19.4 10*3/uL — ABNORMAL HIGH (ref 4.0–10.5)

## 2010-04-16 LAB — URINALYSIS, ROUTINE W REFLEX MICROSCOPIC
Nitrite: NEGATIVE
Protein, ur: 100 mg/dL — AB
Specific Gravity, Urine: 1.028 (ref 1.005–1.030)
Urobilinogen, UA: 1 mg/dL (ref 0.0–1.0)

## 2010-04-16 LAB — AMYLASE: Amylase: 42 U/L (ref 0–105)

## 2010-04-16 LAB — URINE MICROSCOPIC-ADD ON

## 2010-04-16 LAB — POCT CARDIAC MARKERS: CKMB, poc: 5.2 ng/mL (ref 1.0–8.0)

## 2010-04-16 LAB — LIPASE, BLOOD: Lipase: 83 U/L (ref 23–300)

## 2010-04-16 MED ORDER — OXYCODONE-ACETAMINOPHEN 5-325 MG PO TABS: 1.0000 | ORAL_TABLET | ORAL | Status: DC | PRN

## 2010-04-16 MED ORDER — OXYCODONE-ACETAMINOPHEN 5-325 MG PO TABS: 1.0000 | ORAL_TABLET | ORAL | Status: AC | PRN

## 2010-04-16 NOTE — Telephone Encounter (Signed)
Pt called and said that he just had cpx done, but pt didn't get the stool sample test done as usual. Also pt is having trouble with back. Dr Scotty Court had changed from hydrocodone to oxycodone, because oxycodone is easier on pts stomach, but evidently the last time Dr Scotty Court sent in script, he sent in hydrocodone. Pt is req that this be changed back to Oxydone 10 -325 mg.  Pt would like to pick up stool test kit and new script.

## 2010-04-19 NOTE — Telephone Encounter (Signed)
Done

## 2010-04-20 ENCOUNTER — Other Ambulatory Visit: Payer: Self-pay | Admitting: Family Medicine

## 2010-04-26 ENCOUNTER — Other Ambulatory Visit (INDEPENDENT_AMBULATORY_CARE_PROVIDER_SITE_OTHER): Payer: PRIVATE HEALTH INSURANCE | Admitting: Family Medicine

## 2010-04-26 DIAGNOSIS — IMO0001 Reserved for inherently not codable concepts without codable children: Secondary | ICD-10-CM

## 2010-04-26 DIAGNOSIS — K921 Melena: Secondary | ICD-10-CM

## 2010-04-26 LAB — HEMOCCULT GUIAC POC 1CARD (OFFICE): Card #2 Fecal Occult Blod, POC: NEGATIVE

## 2010-05-06 MED ORDER — METOPROLOL TARTRATE 50 MG PO TABS: 50.0000 mg | ORAL_TABLET | Freq: Two times a day (BID) | ORAL | Status: DC

## 2010-05-24 NOTE — Progress Notes (Signed)
Pt aware.

## 2010-05-27 NOTE — Telephone Encounter (Signed)
rx for xanax 0.5 mg # 90 with 5 refills  was called in to pharmacy on 04/13/2010 .  Called pharmacy and was informed that rx cannot be picked up until May 9th 2012 because it was last gotten by patient on 04/09/2010.  Called pt and he is aware

## 2010-06-13 ENCOUNTER — Other Ambulatory Visit: Payer: Self-pay | Admitting: Family Medicine

## 2010-06-19 ENCOUNTER — Other Ambulatory Visit: Payer: Self-pay | Admitting: Family Medicine

## 2010-06-24 ENCOUNTER — Other Ambulatory Visit: Payer: Self-pay | Admitting: Family Medicine

## 2010-06-25 ENCOUNTER — Other Ambulatory Visit: Payer: Self-pay

## 2010-06-25 MED ORDER — ROSUVASTATIN CALCIUM 10 MG PO TABS: 10.0000 mg | ORAL_TABLET | Freq: Every day | ORAL | Status: DC

## 2010-07-24 ENCOUNTER — Other Ambulatory Visit: Payer: Self-pay | Admitting: Family Medicine

## 2010-08-03 ENCOUNTER — Telehealth: Payer: Self-pay | Admitting: *Deleted

## 2010-08-03 NOTE — Telephone Encounter (Signed)
Per Dr. Scotty Court pt called and a message left that duraphen is otc.

## 2010-08-03 NOTE — Telephone Encounter (Signed)
Pt states he has a sinus infection requesting duraphen rx to be called in to [facility].

## 2010-08-05 ENCOUNTER — Ambulatory Visit (INDEPENDENT_AMBULATORY_CARE_PROVIDER_SITE_OTHER): Payer: PRIVATE HEALTH INSURANCE | Admitting: Internal Medicine

## 2010-08-05 ENCOUNTER — Encounter: Payer: Self-pay | Admitting: Internal Medicine

## 2010-08-05 DIAGNOSIS — J019 Acute sinusitis, unspecified: Secondary | ICD-10-CM

## 2010-08-05 DIAGNOSIS — I10 Essential (primary) hypertension: Secondary | ICD-10-CM

## 2010-08-05 MED ORDER — AMOXICILLIN-POT CLAVULANATE 875-125 MG PO TABS: 1.0000 | ORAL_TABLET | Freq: Two times a day (BID) | ORAL | Status: AC

## 2010-08-05 NOTE — Patient Instructions (Signed)
Take your antibiotic as prescribed until ALL of it is gone, but stop if you develop a rash, swelling, or any side effects of the medication.  Contact our office as soon as possible if  there are side effects of the medication.  Mucinex D  twice daily  Call or return to clinic prn if these symptoms worsen or fail to improve as anticipated.

## 2010-08-05 NOTE — Progress Notes (Signed)
  Subjective:    Patient ID: Antonio Cooper, male    DOB: 08-Feb-1955, 55 y.o.   MRN: [id number]  HPI  55 year old patient who is seen today with a one-week history of worsening sinus congestion and pressure. He describes a green sinus drainage associated with the foul odor and altered taste. No documented fever. He states he has had sinus infections in the past and had a purulent right otitis media he required surgery due to a complex sinus disease. He has a history of treated hypertension.    Review of Systems  Constitutional: Positive for fatigue.  HENT: Positive for congestion and postnasal drip.        Objective:   Physical Exam  Constitutional: He is oriented to person, place, and time. He appears well-developed.  HENT:  Head: Normocephalic.  Right Ear: External ear normal.  Left Ear: External ear normal.       Chronic scarring right tympanic membrane but no acute changes  Eyes: Conjunctivae and EOM are normal.  Neck: Normal range of motion.  Cardiovascular: Normal rate and normal heart sounds.   Pulmonary/Chest: Breath sounds normal.  Abdominal: Bowel sounds are normal.  Musculoskeletal: Normal range of motion. He exhibits no edema and no tenderness.  Neurological: He is alert and oriented to person, place, and time.  Psychiatric: He has a normal mood and affect. His behavior is normal.          Assessment & Plan:   Early sinusitis. We'll treat with Mucinex D Augmentin and  nasal irrigation with saline

## 2010-08-17 ENCOUNTER — Telehealth: Payer: Self-pay | Admitting: Family Medicine

## 2010-08-17 MED ORDER — LEVOFLOXACIN 500 MG PO TABS: 500.0000 mg | ORAL_TABLET | Freq: Every day | ORAL | Status: AC

## 2010-08-17 NOTE — Telephone Encounter (Signed)
Pt came in for sinus inf,08/05/10, and was prescribed amoxicillin, but antibiotic in not helping. Pt says that the congestion has gotten in chest. Pt is req that Dr Scotty Court call in [facility].

## 2010-08-17 NOTE — Telephone Encounter (Signed)
Dr. Scotty Court called and spoke with pt.  Pt is aware rx has been called in to pharmacy.

## 2010-10-12 ENCOUNTER — Other Ambulatory Visit: Payer: Self-pay

## 2010-10-12 MED ORDER — ALPRAZOLAM 0.5 MG PO TABS: 0.5000 mg | ORAL_TABLET | Freq: Every evening | ORAL | Status: DC | PRN

## 2010-10-12 NOTE — Telephone Encounter (Signed)
Ok per Dr. Scotty Court to fill pt's alprazolam 90x 5 rf.

## 2011-02-15 ENCOUNTER — Ambulatory Visit: Payer: PRIVATE HEALTH INSURANCE | Admitting: Family Medicine

## 2011-04-14 ENCOUNTER — Encounter: Payer: Self-pay | Admitting: Family Medicine

## 2011-04-14 ENCOUNTER — Ambulatory Visit (INDEPENDENT_AMBULATORY_CARE_PROVIDER_SITE_OTHER): Payer: PRIVATE HEALTH INSURANCE | Admitting: Family Medicine

## 2011-04-14 VITALS — BP 140/90 | Temp 98.9°F | Wt 249.0 lb

## 2011-04-14 DIAGNOSIS — E785 Hyperlipidemia, unspecified: Secondary | ICD-10-CM

## 2011-04-14 DIAGNOSIS — I10 Essential (primary) hypertension: Secondary | ICD-10-CM

## 2011-04-14 DIAGNOSIS — F3289 Other specified depressive episodes: Secondary | ICD-10-CM

## 2011-04-14 DIAGNOSIS — F329 Major depressive disorder, single episode, unspecified: Secondary | ICD-10-CM

## 2011-04-14 DIAGNOSIS — F411 Generalized anxiety disorder: Secondary | ICD-10-CM

## 2011-04-14 DIAGNOSIS — F528 Other sexual dysfunction not due to a substance or known physiological condition: Secondary | ICD-10-CM

## 2011-04-14 DIAGNOSIS — K219 Gastro-esophageal reflux disease without esophagitis: Secondary | ICD-10-CM

## 2011-04-14 LAB — POCT URINALYSIS DIPSTICK
Bilirubin, UA: NEGATIVE
Glucose, UA: NEGATIVE
Ketones, UA: NEGATIVE
Leukocytes, UA: NEGATIVE

## 2011-04-14 LAB — CBC WITH DIFFERENTIAL/PLATELET
Eosinophils Relative: 2.6 % (ref 0.0–5.0)
HCT: 44.7 % (ref 39.0–52.0)
Lymphocytes Relative: 37.4 % (ref 12.0–46.0)
Lymphs Abs: 3.3 10*3/uL (ref 0.7–4.0)
Monocytes Relative: 8.7 % (ref 3.0–12.0)
Platelets: 220 10*3/uL (ref 150.0–400.0)
WBC: 8.9 10*3/uL (ref 4.5–10.5)

## 2011-04-14 LAB — HEPATIC FUNCTION PANEL
ALT: 33 U/L (ref 0–53)
Alkaline Phosphatase: 78 U/L (ref 39–117)
Total Bilirubin: 0.6 mg/dL (ref 0.3–1.2)
Total Protein: 7.5 g/dL (ref 6.0–8.3)

## 2011-04-14 LAB — BASIC METABOLIC PANEL
Chloride: 98 mEq/L (ref 96–112)
GFR: 68.18 mL/min (ref >60.00)
Potassium: 4 mEq/L (ref 3.5–5.1)
Sodium: 137 mEq/L (ref 135–145)

## 2011-04-14 LAB — TSH: TSH: 1.66 u[IU]/mL (ref 0.35–5.50)

## 2011-04-14 LAB — LIPID PANEL: HDL: 52.3 mg/dL (ref >39.00)

## 2011-04-14 LAB — PSA: PSA: 0.63 ng/mL (ref 0.10–4.00)

## 2011-04-14 MED ORDER — SILDENAFIL CITRATE 100 MG PO TABS: 100.0000 mg | ORAL_TABLET | ORAL | Status: DC | PRN

## 2011-04-14 MED ORDER — BUPROPION HCL ER (XL) 300 MG PO TB24: 300.0000 mg | ORAL_TABLET | ORAL | Status: DC

## 2011-04-14 MED ORDER — METOPROLOL TARTRATE 50 MG PO TABS: 50.0000 mg | ORAL_TABLET | Freq: Two times a day (BID) | ORAL | Status: DC

## 2011-04-14 MED ORDER — OMEPRAZOLE 20 MG PO TBEC: 1.0000 | DELAYED_RELEASE_TABLET | Freq: Every day | ORAL | Status: DC

## 2011-04-14 MED ORDER — ROSUVASTATIN CALCIUM 10 MG PO TABS: 10.0000 mg | ORAL_TABLET | Freq: Every day | ORAL | Status: DC

## 2011-04-14 MED ORDER — TRIAMTERENE-HCTZ 37.5-25 MG PO TABS: 1.0000 | ORAL_TABLET | Freq: Every day | ORAL | Status: DC

## 2011-04-14 NOTE — Patient Instructions (Signed)
Set up a 30 minute appointment sometime in the next 2-3 months for general physical exam
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
## 2011-04-15 LAB — LDL CHOLESTEROL, DIRECT: Direct LDL: 112.3 mg/dL

## 2011-08-16 ENCOUNTER — Encounter: Payer: Self-pay | Admitting: Family Medicine

## 2011-08-16 ENCOUNTER — Ambulatory Visit (INDEPENDENT_AMBULATORY_CARE_PROVIDER_SITE_OTHER): Payer: PRIVATE HEALTH INSURANCE | Admitting: Family Medicine

## 2011-08-16 ENCOUNTER — Telehealth: Payer: Self-pay | Admitting: Family Medicine

## 2011-08-16 VITALS — BP 130/84 | Temp 98.4°F | Ht 71.0 in | Wt 245.0 lb

## 2011-08-16 DIAGNOSIS — K219 Gastro-esophageal reflux disease without esophagitis: Secondary | ICD-10-CM

## 2011-08-16 DIAGNOSIS — F528 Other sexual dysfunction not due to a substance or known physiological condition: Secondary | ICD-10-CM

## 2011-08-16 DIAGNOSIS — F329 Major depressive disorder, single episode, unspecified: Secondary | ICD-10-CM

## 2011-08-16 DIAGNOSIS — E785 Hyperlipidemia, unspecified: Secondary | ICD-10-CM

## 2011-08-16 DIAGNOSIS — I10 Essential (primary) hypertension: Secondary | ICD-10-CM

## 2011-08-16 MED ORDER — METOPROLOL TARTRATE 50 MG PO TABS: 50.0000 mg | ORAL_TABLET | Freq: Two times a day (BID) | ORAL | Status: DC

## 2011-08-16 MED ORDER — OMEPRAZOLE 20 MG PO TBEC: 1.0000 | DELAYED_RELEASE_TABLET | Freq: Every day | ORAL | Status: DC

## 2011-08-16 MED ORDER — TRIAMTERENE-HCTZ 37.5-25 MG PO TABS: 1.0000 | ORAL_TABLET | Freq: Every day | ORAL | Status: DC

## 2011-08-16 MED ORDER — BUPROPION HCL ER (XL) 300 MG PO TB24: 300.0000 mg | ORAL_TABLET | ORAL | Status: DC

## 2011-08-16 MED ORDER — SILDENAFIL CITRATE 100 MG PO TABS: 100.0000 mg | ORAL_TABLET | ORAL | Status: DC | PRN

## 2011-08-16 MED ORDER — ROSUVASTATIN CALCIUM 10 MG PO TABS: 10.0000 mg | ORAL_TABLET | Freq: Every day | ORAL | Status: DC

## 2011-08-16 NOTE — Telephone Encounter (Signed)
Patient called back stating that at his appt today with Dr. Tawanna Cooler he did not receive a hemmocult and when told that Dr. Tawanna Cooler does not do this, the patient wanted to know why and would like to have it mailed to him. Please advise.

## 2011-08-16 NOTE — Progress Notes (Signed)
  Subjective:    Patient ID: Antonio Cooper, male    DOB: 09-16-1955, 56 y.o.   MRN: [id number]  HPI Odai is a 56 year old married male nonsmoker who comes in today for physical examination  His meds were reviewed and there've been no changes. He states he feels well. Tetanus booster 2007    Review of Systems  Constitutional: Negative.   HENT: Negative.   Eyes: Negative.   Respiratory: Negative.   Cardiovascular: Negative.   Gastrointestinal: Negative.   Genitourinary: Negative.   Musculoskeletal: Negative.   Skin: Negative.   Neurological: Negative.   Hematological: Negative.   Psychiatric/Behavioral: Negative.        Objective:   Physical Exam  Constitutional: He is oriented to person, place, and time. He appears well-developed and well-nourished.  HENT:  Head: Normocephalic and atraumatic.  Right Ear: External ear normal.  Left Ear: External ear normal.  Nose: Nose normal.  Mouth/Throat: Oropharynx is clear and moist.  Eyes: Conjunctivae and EOM are normal. Pupils are equal, round, and reactive to light.  Neck: Normal range of motion. Neck supple. No JVD present. No tracheal deviation present. No thyromegaly present.  Cardiovascular: Normal rate, regular rhythm, normal heart sounds and intact distal pulses.  Exam reveals no gallop and no friction rub.   No murmur heard. Pulmonary/Chest: Effort normal and breath sounds normal. No stridor. No respiratory distress. He has no wheezes. He has no rales. He exhibits no tenderness.  Abdominal: Soft. Bowel sounds are normal. He exhibits no distension and no mass. There is no tenderness. There is no rebound and no guarding.  Genitourinary: Rectum normal, prostate normal and penis normal. Guaiac negative stool. No penile tenderness.  Musculoskeletal: Normal range of motion. He exhibits no edema and no tenderness.  Lymphadenopathy:    He has no cervical adenopathy.  Neurological: He is alert and oriented to person, place, and time.  He has normal reflexes. No cranial nerve deficit. He exhibits normal muscle tone.  Skin: Skin is warm and dry. No rash noted. No erythema. No pallor.       Total body skin exam normal except for a lot of scars on his back from previous cystic acne as a child  Psychiatric: He has a normal mood and affect. His behavior is normal. Judgment and thought content normal.          Assessment & Plan:  Healthy male  History of mild depression continue Wellbutrin 300 mg daily  History of hypertension continue Lopressor 50 daily  History reflux signs esophagitis continue omeprazole 20 daily  History of hyperlipidemia continue Crestor 20 daily  History of erectile dysfunction continue Viagra 100 mg when necessary

## 2011-08-16 NOTE — Patient Instructions (Signed)
Continue your current medications  Follow-up in 1 year sooner if any problem 

## 2011-08-17 NOTE — Telephone Encounter (Signed)
Fleet Contras please call this was done when I did his rectal exam we don't send cards home anymore

## 2011-08-17 NOTE — Telephone Encounter (Signed)
Patient is aware 

## 2012-06-20 ENCOUNTER — Other Ambulatory Visit: Payer: Self-pay | Admitting: *Deleted

## 2012-06-20 DIAGNOSIS — K219 Gastro-esophageal reflux disease without esophagitis: Secondary | ICD-10-CM

## 2012-06-20 MED ORDER — OMEPRAZOLE 20 MG PO TBEC: 1.0000 | DELAYED_RELEASE_TABLET | Freq: Every day | ORAL | Status: DC

## 2012-07-24 ENCOUNTER — Telehealth: Payer: Self-pay | Admitting: Family Medicine

## 2012-07-24 MED ORDER — AMOXICILLIN 500 MG PO CAPS: 500.0000 mg | ORAL_CAPSULE | Freq: Two times a day (BID) | ORAL | Status: DC

## 2012-07-24 NOTE — Telephone Encounter (Signed)
Rx sent to pharmacy and patient is aware 

## 2012-07-24 NOTE — Telephone Encounter (Signed)
Pt states he has a sinus inf. Dark mucas w/odor, pressure in sinus. Pt has gotten worse in past few day. Would like to know if you could call something in. No appt. until Thurs. Pt request appt asap. Pharm [facility]/ [address] Pt has CPX on 7/28

## 2012-07-24 NOTE — Telephone Encounter (Signed)
Fleet Contras,,,,,,,,,,,, sent him in some amoxicillin 500 mg #20 one twice daily till bilateral empty no refills

## 2012-07-27 ENCOUNTER — Telehealth: Payer: Self-pay | Admitting: Family Medicine

## 2012-07-27 MED ORDER — AZITHROMYCIN 250 MG PO TABS: ORAL_TABLET | ORAL | Status: DC

## 2012-07-27 NOTE — Telephone Encounter (Signed)
Patient Information:  Caller Name: Shaman  Phone: [phone]  Patient: Antonio Cooper, Antonio Cooper  Gender: Male  DOB: 12-Aug-1955  Age: 57 Years  PCP: Kelle Darting [facility])  Office Follow Up:  Does the office need to follow up with this patient?: Yes  Instructions For The Office: Requesting Z pack  RN Note:  Requesting Zpack be called into [facility].  Symptoms  Reason For Call & Symptoms: Calling about continued Sinus Symptoms/URI. He has been on Amoxicillin since 07/24/12 and still having headaches, cough with colored dark mucos. States that Amoxicillin never works and usually take Zpack for Sinus Infections.  Reviewed Health History In EMR: Yes  Reviewed Medications In EMR: Yes  Reviewed Allergies In EMR: No  Reviewed Surgeries / Procedures: Yes  Date of Onset of Symptoms: 07/24/2012  Treatments Tried: Amoxicillin  Treatments Tried Worked: No  Guideline(s) Used:  No Protocol Available - Sick Adult  Disposition Per Guideline:   Discuss with PCP and Callback by Nurse Today  Reason For Disposition Reached:   Nursing judgment  Advice Given:  Call Back If:  New symptoms develop  You become worse.  Patient Will Follow Care Advice:  YES

## 2012-07-27 NOTE — Telephone Encounter (Signed)
Z-max 5days and needs follow up if no better.

## 2012-07-27 NOTE — Telephone Encounter (Signed)
Triage RN attempted to call pt back regarding a cough and congestion but received work number and RN was transferred to 2 different people and then a VM; operator did not seem to know who "[name]" was; left generic message on VM to call back at office phone number regarding Antonio Cooper; RN attempted to call home number and left generic message on home VM also

## 2012-07-27 NOTE — Telephone Encounter (Signed)
Rx sent to pharmacy.  Patient is aware. 

## 2012-08-20 ENCOUNTER — Other Ambulatory Visit (INDEPENDENT_AMBULATORY_CARE_PROVIDER_SITE_OTHER): Payer: PRIVATE HEALTH INSURANCE

## 2012-08-20 DIAGNOSIS — Z0000 Encounter for general adult medical examination without abnormal findings: Secondary | ICD-10-CM

## 2012-08-20 LAB — CBC WITH DIFFERENTIAL/PLATELET
Basophils Relative: 0.5 % (ref 0.0–3.0)
Eosinophils Absolute: 0.1 10*3/uL (ref 0.0–0.7)
HCT: 45.9 % (ref 39.0–52.0)
Hemoglobin: 15.8 g/dL (ref 13.0–17.0)
Lymphs Abs: 2.8 10*3/uL (ref 0.7–4.0)
MCHC: 34.3 g/dL (ref 30.0–36.0)
MCV: 96.6 fl (ref 78.0–100.0)
Monocytes Absolute: 0.5 10*3/uL (ref 0.1–1.0)
Neutro Abs: 4 10*3/uL (ref 1.4–7.7)
Neutrophils Relative %: 53.4 % (ref 43.0–77.0)
RBC: 4.75 Mil/uL (ref 4.22–5.81)

## 2012-08-20 LAB — POCT URINALYSIS DIPSTICK
Glucose, UA: NEGATIVE
Nitrite, UA: NEGATIVE
Urobilinogen, UA: 0.2

## 2012-08-20 LAB — HEPATIC FUNCTION PANEL
ALT: 36 U/L (ref 0–53)
AST: 24 U/L (ref 0–37)
Bilirubin, Direct: 0.2 mg/dL (ref 0.0–0.3)
Total Bilirubin: 0.8 mg/dL (ref 0.3–1.2)

## 2012-08-20 LAB — BASIC METABOLIC PANEL
BUN: 13 mg/dL (ref 6–23)
Creatinine, Ser: 1.1 mg/dL (ref 0.4–1.5)
GFR: 76.05 mL/min (ref >60.00)

## 2012-08-20 LAB — LIPID PANEL: Cholesterol: 196 mg/dL (ref 0–200)

## 2012-08-20 LAB — LDL CHOLESTEROL, DIRECT: Direct LDL: 121.1 mg/dL

## 2012-08-27 ENCOUNTER — Encounter: Payer: PRIVATE HEALTH INSURANCE | Admitting: Family Medicine

## 2012-09-03 ENCOUNTER — Other Ambulatory Visit: Payer: Self-pay | Admitting: Family Medicine

## 2012-09-07 ENCOUNTER — Other Ambulatory Visit: Payer: Self-pay | Admitting: Family Medicine

## 2012-09-11 ENCOUNTER — Encounter: Payer: Self-pay | Admitting: Family

## 2012-09-11 ENCOUNTER — Ambulatory Visit (INDEPENDENT_AMBULATORY_CARE_PROVIDER_SITE_OTHER): Payer: PRIVATE HEALTH INSURANCE | Admitting: Family

## 2012-09-11 VITALS — BP 126/88 | HR 79 | Ht 70.0 in | Wt 241.0 lb

## 2012-09-11 DIAGNOSIS — K219 Gastro-esophageal reflux disease without esophagitis: Secondary | ICD-10-CM

## 2012-09-11 DIAGNOSIS — I10 Essential (primary) hypertension: Secondary | ICD-10-CM

## 2012-09-11 DIAGNOSIS — E785 Hyperlipidemia, unspecified: Secondary | ICD-10-CM

## 2012-09-11 DIAGNOSIS — Z0000 Encounter for general adult medical examination without abnormal findings: Secondary | ICD-10-CM

## 2012-09-11 MED ORDER — TRIAMTERENE-HCTZ 37.5-25 MG PO TABS: ORAL_TABLET | ORAL | Status: DC

## 2012-09-11 MED ORDER — BUPROPION HCL ER (XL) 300 MG PO TB24: ORAL_TABLET | ORAL | Status: DC

## 2012-09-11 MED ORDER — SILDENAFIL CITRATE 100 MG PO TABS: ORAL_TABLET | ORAL | Status: DC

## 2012-09-11 MED ORDER — ROSUVASTATIN CALCIUM 10 MG PO TABS: ORAL_TABLET | ORAL | Status: DC

## 2012-09-11 MED ORDER — OMEPRAZOLE 20 MG PO TBEC: 1.0000 | DELAYED_RELEASE_TABLET | Freq: Every day | ORAL | Status: DC

## 2012-09-11 MED ORDER — METOPROLOL TARTRATE 50 MG PO TABS: 50.0000 mg | ORAL_TABLET | Freq: Two times a day (BID) | ORAL | Status: DC

## 2012-09-11 NOTE — Progress Notes (Signed)
Subjective:    Patient ID: Antonio Cooper, male    DOB: November 05, 1955, 57 y.o.   MRN: [id number]  HPI Patient presents for yearly preventative medicine examination. All immunizations and health maintenance protocols were reviewed with the patient and they are up to date with these protocols. Screening laboratory values were reviewed with the patient including screening of hyperlipidemia PSA renal function and hepatic function. There medications past medical history social history problem list and allergies were reviewed in detail. Goals were established with regard to weight loss exercise diet in compliance with medications   Review of Systems  Constitutional: Negative.   HENT: Negative.   Eyes: Negative.   Respiratory: Negative.   Cardiovascular: Negative.   Gastrointestinal: Negative.   Endocrine: Negative.   Genitourinary: Negative.   Musculoskeletal: Negative.   Skin: Negative.   Allergic/Immunologic: Negative.   Neurological: Negative.   Hematological: Negative.   Psychiatric/Behavioral: Negative.    Past Medical History  Diagnosis Date  . Depression   . GERD (gastroesophageal reflux disease)   . Hyperlipidemia   . Hypertension     History   Social History  . Marital Status: Single    Spouse Name: N/A    Number of Children: N/A  . Years of Education: N/A   Occupational History  . Not on file.   Social History Main Topics  . Smoking status: Former Games developer  . Smokeless tobacco: Never Used  . Alcohol Use: Yes  . Drug Use: No  . Sexually Active: Yes   Other Topics Concern  . Not on file   Social History Narrative  . No narrative on file    Past Surgical History  Procedure Laterality Date  . Broken nose   1974  . Tympanic membrane repair      Family History  Problem Relation Age of Onset  . Hypertension Father     No Known Allergies  Current Outpatient Prescriptions on File Prior to Visit  Medication Sig Dispense Refill  . aspirin 81 MG tablet Take  81 mg by mouth daily.        Marland Kitchen buPROPion (WELLBUTRIN XL) 300 MG 24 hr tablet Take one tablet by mouth every day  as directed  30 tablet  2  . clotrimazole-betamethasone (LOTRISONE) cream APPLY TO RASH TWICE DAILY  45 g  11  . CRESTOR 10 MG tablet Take one tablet by mouth one time daily  30 tablet  2  . metoprolol (LOPRESSOR) 50 MG tablet Take 1 tablet (50 mg total) by mouth 2 (two) times daily.  100 tablet  3  . Omeprazole 20 MG TBEC Take 1 tablet (20 mg total) by mouth daily.  100 each  0  . triamterene-hydrochlorothiazide (MAXZIDE-25) 37.5-25 MG per tablet Take one tablet by mouth one time daily  30 tablet  2  . VIAGRA 100 MG tablet Take 1 tablet by mouth as needed for erectile dysfunction  6 tablet  2  . amoxicillin (AMOXIL) 500 MG capsule Take 1 capsule (500 mg total) by mouth 2 (two) times daily.  20 capsule  0  . azithromycin (ZITHROMAX) 250 MG tablet Use as directed  6 tablet  0   No current facility-administered medications on file prior to visit.    BP 126/88  Pulse 79  Ht 5\' 10"  (1.778 m)  Wt 241 lb (109.317 kg)  BMI 34.58 kg/m2  SpO2 98%chart    Objective:   Physical Exam  Constitutional: He is oriented to person, place, and time. He  appears well-developed and well-nourished.  HENT:  Head: Normocephalic.  Right Ear: External ear normal.  Left Ear: External ear normal.  Nose: Nose normal.  Mouth/Throat: Oropharynx is clear and moist.  Eyes: Conjunctivae and EOM are normal. Pupils are equal, round, and reactive to light.  Neck: Normal range of motion. Neck supple. No thyromegaly present.  Cardiovascular: Normal rate, regular rhythm and normal heart sounds.   Pulmonary/Chest: Effort normal and breath sounds normal.  Abdominal: Soft. Bowel sounds are normal.  Genitourinary: Rectum normal, prostate normal and penis normal. Guaiac negative stool. No penile tenderness.  Neurological: He is alert and oriented to person, place, and time. He has normal reflexes. He displays  normal reflexes. No cranial nerve deficit. Coordination normal.  Skin: Skin is warm and dry.          Assessment & Plan:  Assessment:  1. CPX 2. Depression 3. Hypercholesterolemia  4. GERD  Plan: Encouraged healthy diet, and exercise. Continue current medications. Follow with PCP in 6 months and sooner as needed.

## 2012-09-11 NOTE — Patient Instructions (Addendum)

## 2012-12-18 ENCOUNTER — Emergency Department (HOSPITAL_COMMUNITY): Payer: PRIVATE HEALTH INSURANCE

## 2012-12-18 ENCOUNTER — Encounter (HOSPITAL_COMMUNITY): Payer: PRIVATE HEALTH INSURANCE | Admitting: Anesthesiology

## 2012-12-18 ENCOUNTER — Encounter: Payer: Self-pay | Admitting: Internal Medicine

## 2012-12-18 ENCOUNTER — Telehealth: Payer: Self-pay | Admitting: Family Medicine

## 2012-12-18 ENCOUNTER — Inpatient Hospital Stay (HOSPITAL_COMMUNITY)
Admission: EM | Admit: 2012-12-18 | Discharge: 2012-12-23 | DRG: 502 | Disposition: A | Discharge status: Home / Self Care | Payer: PRIVATE HEALTH INSURANCE | Attending: Orthopedic Surgery | Admitting: Orthopedic Surgery

## 2012-12-18 ENCOUNTER — Encounter (HOSPITAL_COMMUNITY)
Admission: EM | Disposition: A | Discharge status: Home / Self Care | Payer: Self-pay | Source: Home / Self Care | Attending: Orthopedic Surgery

## 2012-12-18 ENCOUNTER — Emergency Department (HOSPITAL_COMMUNITY): Payer: PRIVATE HEALTH INSURANCE | Admitting: Anesthesiology

## 2012-12-18 ENCOUNTER — Ambulatory Visit (INDEPENDENT_AMBULATORY_CARE_PROVIDER_SITE_OTHER): Payer: PRIVATE HEALTH INSURANCE | Admitting: Internal Medicine

## 2012-12-18 ENCOUNTER — Encounter (HOSPITAL_COMMUNITY): Payer: Self-pay | Admitting: Emergency Medicine

## 2012-12-18 VITALS — BP 160/90 | HR 84 | Temp 98.2°F | Wt 249.0 lb

## 2012-12-18 DIAGNOSIS — M65839 Other synovitis and tenosynovitis, unspecified forearm: Principal | ICD-10-CM | POA: Diagnosis present

## 2012-12-18 DIAGNOSIS — Z79899 Other long term (current) drug therapy: Secondary | ICD-10-CM

## 2012-12-18 DIAGNOSIS — E785 Hyperlipidemia, unspecified: Secondary | ICD-10-CM | POA: Diagnosis present

## 2012-12-18 DIAGNOSIS — A4901 Methicillin susceptible Staphylococcus aureus infection, unspecified site: Secondary | ICD-10-CM | POA: Diagnosis present

## 2012-12-18 DIAGNOSIS — K219 Gastro-esophageal reflux disease without esophagitis: Secondary | ICD-10-CM | POA: Diagnosis present

## 2012-12-18 DIAGNOSIS — S61409A Unspecified open wound of unspecified hand, initial encounter: Secondary | ICD-10-CM

## 2012-12-18 DIAGNOSIS — L089 Local infection of the skin and subcutaneous tissue, unspecified: Secondary | ICD-10-CM

## 2012-12-18 DIAGNOSIS — I10 Essential (primary) hypertension: Secondary | ICD-10-CM | POA: Diagnosis present

## 2012-12-18 DIAGNOSIS — Z7982 Long term (current) use of aspirin: Secondary | ICD-10-CM

## 2012-12-18 DIAGNOSIS — M659 Synovitis and tenosynovitis, unspecified: Secondary | ICD-10-CM

## 2012-12-18 DIAGNOSIS — L02519 Cutaneous abscess of unspecified hand: Secondary | ICD-10-CM | POA: Diagnosis present

## 2012-12-18 DIAGNOSIS — Z87891 Personal history of nicotine dependence: Secondary | ICD-10-CM

## 2012-12-18 DIAGNOSIS — S61209A Unspecified open wound of unspecified finger without damage to nail, initial encounter: Secondary | ICD-10-CM | POA: Diagnosis present

## 2012-12-18 DIAGNOSIS — Z23 Encounter for immunization: Secondary | ICD-10-CM

## 2012-12-18 DIAGNOSIS — F3289 Other specified depressive episodes: Secondary | ICD-10-CM | POA: Diagnosis present

## 2012-12-18 DIAGNOSIS — W268XXA Contact with other sharp object(s), not elsewhere classified, initial encounter: Secondary | ICD-10-CM | POA: Diagnosis present

## 2012-12-18 DIAGNOSIS — M65949 Unspecified synovitis and tenosynovitis, unspecified hand: Secondary | ICD-10-CM

## 2012-12-18 DIAGNOSIS — L03019 Cellulitis of unspecified finger: Secondary | ICD-10-CM | POA: Diagnosis present

## 2012-12-18 DIAGNOSIS — F329 Major depressive disorder, single episode, unspecified: Secondary | ICD-10-CM | POA: Diagnosis present

## 2012-12-18 HISTORY — PX: I & D EXTREMITY: SHX5045

## 2012-12-18 LAB — BASIC METABOLIC PANEL
CO2: 25 mEq/L (ref 19–32)
Calcium: 10 mg/dL (ref 8.4–10.5)
Chloride: 99 mEq/L (ref 96–112)
Glucose, Bld: 102 mg/dL — ABNORMAL HIGH (ref 70–99)
Sodium: 136 mEq/L (ref 135–145)

## 2012-12-18 LAB — CBC WITH DIFFERENTIAL/PLATELET
Eosinophils Relative: 1 % (ref 0–5)
HCT: 45.9 % (ref 39.0–52.0)
Lymphocytes Relative: 28 % (ref 12–46)
Lymphs Abs: 3.2 10*3/uL (ref 0.7–4.0)
MCV: 94.6 fL (ref 78.0–100.0)
Neutro Abs: 7 10*3/uL (ref 1.7–7.7)
Platelets: 246 10*3/uL (ref 150–400)
RBC: 4.85 MIL/uL (ref 4.22–5.81)
WBC: 11.4 10*3/uL — ABNORMAL HIGH (ref 4.0–10.5)

## 2012-12-18 SURGERY — IRRIGATION AND DEBRIDEMENT EXTREMITY
Anesthesia: General | Site: Finger | Laterality: Left | Wound class: Dirty or Infected

## 2012-12-18 MED ORDER — MORPHINE SULFATE 4 MG/ML IJ SOLN
4.0000 mg | Freq: Once | INTRAMUSCULAR | Status: AC
  Administered 2012-12-18: 4 mg via INTRAVENOUS
  Filled 2012-12-18: qty 1

## 2012-12-18 MED ORDER — SODIUM CHLORIDE 0.9 % IV SOLN
3.0000 g | Freq: Once | INTRAVENOUS | Status: AC
  Administered 2012-12-18: 3 g via INTRAVENOUS
  Filled 2012-12-18: qty 3

## 2012-12-18 MED ORDER — HYDROMORPHONE HCL PF 2 MG/ML IJ SOLN
1.5000 mg | Freq: Once | INTRAMUSCULAR | Status: AC
  Administered 2012-12-18: 1.5 mg via INTRAVENOUS
  Filled 2012-12-18: qty 1

## 2012-12-18 MED ORDER — VANCOMYCIN HCL IN DEXTROSE 1-5 GM/200ML-% IV SOLN
1000.0000 mg | Freq: Once | INTRAVENOUS | Status: AC
  Administered 2012-12-18: 1000 mg via INTRAVENOUS
  Filled 2012-12-18: qty 200

## 2012-12-18 MED ORDER — HYDROMORPHONE HCL PF 1 MG/ML IJ SOLN
1.0000 mg | Freq: Once | INTRAMUSCULAR | Status: AC
  Administered 2012-12-18: 1 mg via INTRAVENOUS
  Filled 2012-12-18: qty 1

## 2012-12-18 MED ORDER — CEFTRIAXONE SODIUM 1 G IJ SOLR
1.0000 g | Freq: Once | INTRAMUSCULAR | Status: AC
  Administered 2012-12-18: 1 g via INTRAMUSCULAR

## 2012-12-18 SURGICAL SUPPLY — 46 items
BANDAGE CONFORM 2  STR LF (GAUZE/BANDAGES/DRESSINGS) ×2 IMPLANT
BANDAGE ELASTIC 4 VELCRO ST LF (GAUZE/BANDAGES/DRESSINGS) ×2 IMPLANT
BANDAGE GAUZE ELAST BULKY 4 IN (GAUZE/BANDAGES/DRESSINGS) ×2 IMPLANT
CLOTH BEACON ORANGE TIMEOUT ST (SAFETY) ×2 IMPLANT
CORDS BIPOLAR (ELECTRODE) ×2 IMPLANT
CUFF TOURNIQUET SINGLE 18IN (TOURNIQUET CUFF) ×2 IMPLANT
CUFF TOURNIQUET SINGLE 24IN (TOURNIQUET CUFF) IMPLANT
DRSG ADAPTIC 3X8 NADH LF (GAUZE/BANDAGES/DRESSINGS) ×2 IMPLANT
ELECT REM PT RETURN 9FT ADLT (ELECTROSURGICAL)
ELECTRODE REM PT RTRN 9FT ADLT (ELECTROSURGICAL) IMPLANT
GAUZE XEROFORM 1X8 LF (GAUZE/BANDAGES/DRESSINGS) ×2 IMPLANT
GLOVE BIOGEL M STRL SZ7.5 (GLOVE) ×3 IMPLANT
GLOVE BIOGEL PI IND STRL 7.5 (GLOVE) IMPLANT
GLOVE BIOGEL PI INDICATOR 7.5 (GLOVE) ×1
GLOVE SS BIOGEL STRL SZ 8 (GLOVE) ×1 IMPLANT
GLOVE SUPERSENSE BIOGEL SZ 8 (GLOVE) ×2
GLOVE SURG SS PI 7.5 STRL IVOR (GLOVE) ×2 IMPLANT
GOWN PREVENTION PLUS XLARGE (GOWN DISPOSABLE) ×2 IMPLANT
GOWN STRL NON-REIN LRG LVL3 (GOWN DISPOSABLE) ×6 IMPLANT
GOWN STRL REIN XL XLG (GOWN DISPOSABLE) ×4 IMPLANT
HANDPIECE INTERPULSE COAX TIP (DISPOSABLE)
KIT BASIN OR (CUSTOM PROCEDURE TRAY) ×2 IMPLANT
KIT ROOM TURNOVER OR (KITS) ×2 IMPLANT
MANIFOLD NEPTUNE II (INSTRUMENTS) ×2 IMPLANT
NDL HYPO 25GX1X1/2 BEV (NEEDLE) IMPLANT
NEEDLE HYPO 25GX1X1/2 BEV (NEEDLE) IMPLANT
NS IRRIG 1000ML POUR BTL (IV SOLUTION) ×2 IMPLANT
PACK ORTHO EXTREMITY (CUSTOM PROCEDURE TRAY) ×2 IMPLANT
PAD ARMBOARD 7.5X6 YLW CONV (MISCELLANEOUS) ×4 IMPLANT
PAD CAST 4YDX4 CTTN HI CHSV (CAST SUPPLIES) ×1 IMPLANT
PADDING CAST COTTON 4X4 STRL (CAST SUPPLIES) ×2
SET HNDPC FAN SPRY TIP SCT (DISPOSABLE) IMPLANT
SPONGE GAUZE 4X4 12PLY (GAUZE/BANDAGES/DRESSINGS) ×2 IMPLANT
SPONGE LAP 18X18 X RAY DECT (DISPOSABLE) ×2 IMPLANT
SPONGE LAP 4X18 X RAY DECT (DISPOSABLE) ×2 IMPLANT
SUT PROLENE 4 0 PS 2 18 (SUTURE) ×2 IMPLANT
SYR 50ML LL SCALE MARK (SYRINGE) ×1 IMPLANT
SYR CONTROL 10ML LL (SYRINGE) IMPLANT
TOWEL OR 17X24 6PK STRL BLUE (TOWEL DISPOSABLE) ×2 IMPLANT
TOWEL OR 17X26 10 PK STRL BLUE (TOWEL DISPOSABLE) ×2 IMPLANT
TUBE ANAEROBIC SPECIMEN COL (MISCELLANEOUS) ×1 IMPLANT
TUBE CONNECTING 12X1/4 (SUCTIONS) ×2 IMPLANT
TUBE FEEDING 5FR 15 INCH (TUBING) ×2 IMPLANT
TUBE FEEDING 8FR 16IN STR KANG (MISCELLANEOUS) ×1 IMPLANT
WATER STERILE IRR 1000ML POUR (IV SOLUTION) ×2 IMPLANT
YANKAUER SUCT BULB TIP NO VENT (SUCTIONS) ×2 IMPLANT

## 2012-12-18 NOTE — ED Provider Notes (Signed)
I saw and evaluated the patient, reviewed the resident's note and I agree with the findings and plan. If applicable, I agree with the resident's interpretation of the EKG.  If applicable, I was present for critical portions of any procedures performed.  Pain and swelling to left third digit after sustaining laceration 3 days ago from glass. There is a horizontal laceration to the third digit at the PIP joint on the palmar side surrounding erythema streaks up and down the finger into the palm. Pain with passive extension. Patient held in partial flexion. Tenderness palpation along the tendon sheath. Concern for flexor tenosynovitis. Unable to exclude flexor tendon injury. D/w Dr. Amanda Pea.  Glynn Octave, MD 12/18/12 [phone]

## 2012-12-18 NOTE — ED Notes (Signed)
Spoke with OR about delay in pt transport to OR- informed another 1-1.5 hours until surgery.  Pt updated.
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
## 2012-12-18 NOTE — Patient Instructions (Addendum)
Plan emergent. hand referral.  Wound looks infected at this time and concern cellulitis vs tracking down tendon path  Dr Mina Marble looked at picture and said to go to ED immediatly  ( he is not on call today)  They also dont take you insurance  Gram of rocephin antibiotic now.

## 2012-12-18 NOTE — ED Notes (Signed)
Pt brother- Douglas Smolinsky [phone].  Family password for pt information: [place]

## 2012-12-18 NOTE — Anesthesia Preprocedure Evaluation (Addendum)
Anesthesia Evaluation  Patient identified by MRN, date of birth, ID band Patient awake    Reviewed: Allergy & Precautions, H&P , NPO status , Patient's Chart, lab work & pertinent test results, reviewed documented beta blocker date and time   History of Anesthesia Complications Negative for: history of anesthetic complications  Airway Mallampati: II TM Distance: >3 FB Neck ROM: full    Dental   Pulmonary former smoker,  breath sounds clear to auscultation        Cardiovascular hypertension, On Medications and On Home Beta Blockers Rhythm:regular     Neuro/Psych PSYCHIATRIC DISORDERS negative neurological ROS     GI/Hepatic Neg liver ROS, GERD-  Medicated and Controlled,  Endo/Other  negative endocrine ROS  Renal/GU negative Renal ROS  negative genitourinary   Musculoskeletal   Abdominal   Peds  Hematology negative hematology ROS (+)   Anesthesia Other Findings See surgeon's H&P   Reproductive/Obstetrics negative OB ROS                        Anesthesia Physical Anesthesia Plan  ASA: II and emergent  Anesthesia Plan: General   Post-op Pain Management:    Induction: Intravenous  Airway Management Planned: Oral ETT  Additional Equipment:   Intra-op Plan:   Post-operative Plan: Extubation in OR  Informed Consent: I have reviewed the patients History and Physical, chart, labs and discussed the procedure including the risks, benefits and alternatives for the proposed anesthesia with the patient or authorized representative who has indicated his/her understanding and acceptance.   Dental Advisory Given  Plan Discussed with: CRNA and Surgeon  Anesthesia Plan Comments:         Anesthesia Quick Evaluation

## 2012-12-18 NOTE — Telephone Encounter (Signed)
Noted  

## 2012-12-18 NOTE — H&P (Signed)
Antonio Cooper is an 57 y.o. male.   Chief Complaint: Cut finger HPI: The patient is a pleasant 27-year-old male presented to the emergency room today for evaluation of his left middle finger. He sustained a laceration this Sunday while removing a light bulb the bulb broke and the glass subsequently cut him. He sustained a proximal 1 cm laceration of the volar aspect of his PIP about the left middle finger. He states initially he had no significant pain but wound did bleed a great deal. Unfortunately the past few days he's had increasing pain, swelling, erythema and eyes at the extent he has had significant pain and notices significant redness and difficulty flexing or extending her finger. He was seen earlier today by his family physician who referred him to the emergency room setting for evaluation and hand surgery consult. He denies numbness or tingling about the digits. He denies fevers chills shortness of breath, nausea or vomiting, the constitutional symptoms.  Past Medical History  Diagnosis Date  . Depression   . GERD (gastroesophageal reflux disease)   . Hyperlipidemia   . Hypertension     Past Surgical History  Procedure Laterality Date  . Broken nose   1974  . Tympanic membrane repair      Family History  Problem Relation Age of Onset  . Hypertension Father    Social History:  reports that he has quit smoking. He has never used smokeless tobacco. He reports that he drinks alcohol. He reports that he does not use illicit drugs.  Allergies: No Known Allergies   (Not in a hospital admission)  Results for orders placed during the hospital encounter of 12/18/12 (from the past 48 hour(s))  CBC WITH DIFFERENTIAL     Status: Abnormal   Collection Time    12/18/12  4:13 PM      Result Value Range   WBC 11.4 (*) 4.0 - 10.5 K/uL   RBC 4.85  4.22 - 5.81 MIL/uL   Hemoglobin 16.4  13.0 - 17.0 g/dL   HCT 16.1  09.6 - 04.5 %   MCV 94.6  78.0 - 100.0 fL   MCH 33.8  26.0 - 34.0 pg   MCHC 35.7  30.0 - 36.0 g/dL   RDW 40.9  81.1 - 91.4 %   Platelets 246  150 - 400 K/uL   Neutrophils Relative % 62  43 - 77 %   Neutro Abs 7.0  1.7 - 7.7 K/uL   Lymphocytes Relative 28  12 - 46 %   Lymphs Abs 3.2  0.7 - 4.0 K/uL   Monocytes Relative 8  3 - 12 %   Monocytes Absolute 0.9  0.1 - 1.0 K/uL   Eosinophils Relative 1  0 - 5 %   Eosinophils Absolute 0.2  0.0 - 0.7 K/uL   Basophils Relative 0  0 - 1 %   Basophils Absolute 0.1  0.0 - 0.1 K/uL  BASIC METABOLIC PANEL     Status: Abnormal   Collection Time    12/18/12  4:13 PM      Result Value Range   Sodium 136  135 - 145 mEq/L   Potassium 3.6  3.5 - 5.1 mEq/L   Chloride 99  96 - 112 mEq/L   CO2 25  19 - 32 mEq/L   Glucose, Bld 102 (*) 70 - 99 mg/dL   BUN 15  6 - 23 mg/dL   Creatinine, Ser 7.82  0.50 - 1.35 mg/dL   Calcium 10.0  8.4 - 10.5 mg/dL   GFR calc non Af Amer 70 (*) >90 mL/min   GFR calc Af Amer 81 (*) >90 mL/min   Comment: (NOTE)     The eGFR has been calculated using the CKD EPI equation.     This calculation has not been validated in all clinical situations.     eGFR's persistently <90 mL/min signify possible Chronic Kidney     Disease.   Dg Hand Complete Left  12/18/2012   CLINICAL DATA:  Laceration, injury  EXAM: LEFT HAND - COMPLETE 3+ VIEW  COMPARISON:  None.  FINDINGS: Normal alignment without acute fracture. Minor joint space narrowing of the DIP and PIP joints. Slight left middle finger soft tissue swelling. No radiopaque foreign body.  IMPRESSION: No acute osseous finding or radiopaque foreign body   Electronically Signed   By: Ruel Favors M.D.   On: 12/18/2012 17:25    Review of Systems  Constitutional: Negative.   HENT: Negative.   Eyes: Negative.   Respiratory: Negative.   Cardiovascular: Negative.   Gastrointestinal: Negative.   Genitourinary: Negative.   Musculoskeletal:       See history of present illness  Skin: Negative.     Blood pressure 133/81, pulse 88, temperature 98.2 F (36.8  C), temperature source Oral, resp. rate 17, height 6' (1.829 m), weight 112.22 kg (247 lb 6.4 oz), SpO2 99.00%. Physical Exam  ..The patient is alert and oriented in no acute distress the patient Evaluation of the left hand the left middle finger shows that he has swelling about the digits circumferentially. He as erythema about the volar palmar aspect of the MP region. He holds the finger slightly flexed position, FDP is intact, FDS appears weak. His examination is concerning for an early infectious flexor tenosynovitis rule out partial tendon laceration of the FDS complains of pain in the affected upper extremity.  The patient is noted to have a normal HEENT exam.  Lung fields show equal chest expansion and no shortness of breath  abdomen exam is nontender without distention.  Lower extremity examination does not show any fracture dislocation or blood clot symptoms.  Pelvis is stable neck and back are stable and nontender  Assessment/Plan Left finger laceration with ascending cellulitis, rule out early infectious flexor tenosynovitis and partial FDS laceration Patient Active Problem List   Diagnosis Date Noted  . ERECTILE DYSFUNCTION 01/04/2007  . HYPERLIPIDEMIA 09/27/2006  . DEPRESSION 09/27/2006  . HYPERTENSION 09/27/2006  . GERD 09/27/2006   .Marland KitchenWe are planning surgery for your upper extremity. The risk and benefits of surgery include risk of bleeding infection anesthesia damage to normal structures and failure of the surgery to accomplish its intended goals of relieving symptoms and restoring function with this in mind we'll going to proceed. I have specifically discussed with the patient the pre-and postoperative regime and the does and don'ts and risk and benefits in great detail. Risk and benefits of surgery also include risk of dystrophy chronic nerve pain failure of the healing process to go onto completion and other inherent risks of surgery The relavent the pathophysiology of the  disease/injury process, as well as the alternatives for treatment and postoperative course of action has been discussed in great detail with the patient who desires to proceed.  We will do everything in our power to help you (the patient) restore function to the upper extremity. Is a pleasure to see this patient today.   Lirio Bach L 12/18/2012, 8:04 PM

## 2012-12-18 NOTE — Telephone Encounter (Signed)
Patient Information:  Caller Name: Iktan  Phone: [phone]  Patient: Antonio Cooper, Antonio Cooper  Gender: Male  DOB: September 09, 1955  Age: 57 Years  PCP: Kelle Darting [facility])  Office Follow Up:  Does the office need to follow up with this patient?: No  Instructions For The Office: N/A  RN Note:  Actual injury occurred on 11/16, but seems to have gotten worse with pain and palm swelling.  Has had to take Motrin for pain during the night and is unable to bend middle finger or fold palm which he describes as twice its normal size.  Outside of swelling no signs of infection, but deep cut to affected finger is not closed but not draining either.  Is cleaning several times a day and applying neosporin.  Is not able to use hand or tie necktie.  Patient is left handed.  Triaged using Laceration in [facility] with a disposition to see in 4 hours. Care advice given.  Unable to get appointment with Dr. Tawanna Cooler who is full.  Scheduled at 14:15 with Dr. Fabian Sharp for assessment and possible referral.  Symptoms  Reason For Call & Symptoms: Tried to change a flood light in kitchen.  Bulb exploded with patient falling to the floor and caught self with left hand.  Hand has small cuts on hand with a deep cut in very center finger leaving an inch gash. It is gaping but not bleeding.  Left hand is throbbing - middle finger will not fold nor will palm  Reviewed Health History In EMR: Yes  Reviewed Medications In EMR: Yes  Reviewed Allergies In EMR: Yes  Reviewed Surgeries / Procedures: Yes  Date of Onset of Symptoms: 12/16/2012  Guideline(s) Used:  Hand and Wrist Injury  Disposition Per Guideline:   See Today in Office  Reason For Disposition Reached:   Patient wants to be seen  Advice Given:  Call Back If:  You become worse.  Patient Will Follow Care Advice:  YES  Appointment Scheduled:  12/18/2012 14:15:00 Appointment Scheduled Provider:  Berniece Andreas [facility])

## 2012-12-18 NOTE — ED Provider Notes (Signed)
CSN: [id number]     Arrival date & time 12/18/12  1555 History   First MD Initiated Contact with Patient 12/18/12 1605     Chief Complaint  Patient presents with  . Hand Pain   (Consider location/radiation/quality/duration/timing/severity/associated sxs/prior Treatment) The history is provided by the patient. No language interpreter was used.   Patient's 57 year old Caucasian male with no past medical history comes emergency department today with pain to his left hand. 3 days ago he was changing a light bulb and the light bulb burst and cut his hand. Laceration was over the volar aspect of the proximal interphalangeal joint of the third digit. He was initially doing well however over the past day it felt significant pain to the hand radiating down into the wrist. Patient has difficulty with movement of the second digit of the left hand. He went to his primary care doctor today because the pain became so severe. Care physician was concerned he may have a flexor tenosynovitis ascending to the emergency department for evaluation. At the primary care doctor he was treated with tetanus and Rocephin.  He rates the pain at a 10/10.    Past Medical History  Diagnosis Date  . Depression   . GERD (gastroesophageal reflux disease)   . Hyperlipidemia   . Hypertension    Past Surgical History  Procedure Laterality Date  . Broken nose   1974  . Tympanic membrane repair     Family History  Problem Relation Age of Onset  . Hypertension Father    History  Substance Use Topics  . Smoking status: Former Games developer  . Smokeless tobacco: Never Used  . Alcohol Use: Yes    Review of Systems  Constitutional: Negative for fever and chills.  Respiratory: Negative for cough and shortness of breath.   Gastrointestinal: Negative for nausea, vomiting, diarrhea and constipation.  Genitourinary: Negative for dysuria, urgency and frequency.  Musculoskeletal: Negative for back pain and neck pain.  Skin: Negative  for color change, rash and wound.  Psychiatric/Behavioral: Negative for confusion.  All other systems reviewed and are negative.    Allergies  Review of patient's allergies indicates no known allergies.  Home Medications   Current Outpatient Rx  Name  Route  Sig  Dispense  Refill  . aspirin 81 MG tablet   Oral   Take 81 mg by mouth daily.           Marland Kitchen buPROPion (WELLBUTRIN XL) 300 MG 24 hr tablet      Take one tablet by mouth every day  as directed   30 tablet   2   . clotrimazole-betamethasone (LOTRISONE) cream      APPLY TO RASH TWICE DAILY   45 g   11   . metoprolol (LOPRESSOR) 50 MG tablet   Oral   Take 1 tablet (50 mg total) by mouth 2 (two) times daily.   100 tablet   3   . Multiple Vitamin (MULTI VITAMIN DAILY PO)   Oral   Take 1 tablet by mouth daily.         . Omeprazole 20 MG TBEC   Oral   Take 1 tablet (20 mg total) by mouth daily.   100 each   0   . rosuvastatin (CRESTOR) 10 MG tablet      Take one tablet by mouth one time daily   30 tablet   2   . sildenafil (VIAGRA) 100 MG tablet      Take 1 tablet  by mouth as needed for erectile dysfunction   6 tablet   2   . triamterene-hydrochlorothiazide (MAXZIDE-25) 37.5-25 MG per tablet      Take one tablet by mouth one time daily   30 tablet   2    BP 140/88  Pulse 88  Temp(Src) 98.2 F (36.8 C) (Oral)  Resp 18  Ht 6' (1.829 m)  Wt 247 lb 6.4 oz (112.22 kg)  BMI 33.55 kg/m2  SpO2 99% Physical Exam  Nursing note and vitals reviewed. Constitutional: He is oriented to person, place, and time. He appears well-developed and well-nourished. No distress.  HENT:  Head: Normocephalic and atraumatic.  Eyes: Pupils are equal, round, and reactive to light.  Neck: Normal range of motion.  Cardiovascular: Normal rate, regular rhythm and normal heart sounds.   Pulmonary/Chest: Effort normal and breath sounds normal. No respiratory distress. He has no decreased breath sounds. He has no wheezes.  He has no rhonchi. He has no rales.  Abdominal: Soft. He exhibits no distension. There is no tenderness. There is no rebound and no guarding.  Musculoskeletal: He exhibits no edema and no tenderness.  Left hand with 3rd digit held in slight flexion, pain with passive extension of the 3rd digit, and mild erythema to the palm.  Laceration to the volar aspect of the PIP of the 3rd digit.    Neurological: He is alert and oriented to person, place, and time. He exhibits normal muscle tone.  Skin: Skin is warm and dry.    ED Course  Procedures (including critical care time) Labs Review Labs Reviewed  CBC WITH DIFFERENTIAL - Abnormal; Notable for the following:    WBC 11.4 (*)    All other components within normal limits  BASIC METABOLIC PANEL - Abnormal; Notable for the following:    Glucose, Bld 102 (*)    GFR calc non Af Amer 70 (*)    GFR calc Af Amer 81 (*)    All other components within normal limits   Imaging Review Dg Hand Complete Left  12/18/2012   CLINICAL DATA:  Laceration, injury  EXAM: LEFT HAND - COMPLETE 3+ VIEW  COMPARISON:  None.  FINDINGS: Normal alignment without acute fracture. Minor joint space narrowing of the DIP and PIP joints. Slight left middle finger soft tissue swelling. No radiopaque foreign body.  IMPRESSION: No acute osseous finding or radiopaque foreign body   Electronically Signed   By: Ruel Favors M.D.   On: 12/18/2012 17:25    EKG Interpretation   None       MDM  Patient is a 57 year old Caucasian male who comes emergency department today with concerns for flexor tenosynovitis. Physical exam as above. Exam is concerning for a flexor tenosynovitis. As result it was consulted. Initial workup included a CBC, BMP, and x-ray of the left hand. X-ray demonstrated no fracture.  CBC had WBC of 11.4. BMP was unremarkable. And evaluated the patient emergency department. They were concerned this is a flexor tenosynovitis. Patient was treated with Unasyn and  Vancomycin in the emergency department. He was transferred to the operating room with hand surgery in stable condition. Labs and imaging reviewed by myself and considered and medical decision-making. Imaging was interpreted radiology. Care was discussed my attending Dr. Manus Gunning.   1. Flexor tenosynovitis of finger       Bethann Berkshire, MD 12/18/12 2020

## 2012-12-18 NOTE — ED Notes (Signed)
Pt reports changing light bulb on Saturday, it burst and cut left hand. Pt having pain, redness, swelling to hand since, sent here for hand referral, was given tetanus and rocephin inj at dr office pta.

## 2012-12-19 LAB — CBC WITH DIFFERENTIAL/PLATELET
Eosinophils Absolute: 0.1 10*3/uL (ref 0.0–0.7)
Hemoglobin: 14.2 g/dL (ref 13.0–17.0)
Lymphocytes Relative: 30 % (ref 12–46)
Lymphs Abs: 3 10*3/uL (ref 0.7–4.0)
MCH: 33 pg (ref 26.0–34.0)
MCV: 95.6 fL (ref 78.0–100.0)
Monocytes Relative: 10 % (ref 3–12)
Neutrophils Relative %: 60 % (ref 43–77)
Platelets: 224 10*3/uL (ref 150–400)
RBC: 4.3 MIL/uL (ref 4.22–5.81)
WBC: 10.3 10*3/uL (ref 4.0–10.5)

## 2012-12-19 MED ORDER — VITAMIN C 500 MG PO TABS
1000.0000 mg | ORAL_TABLET | Freq: Every day | ORAL | Status: DC
  Administered 2012-12-19 – 2012-12-23 (×4): 1000 mg via ORAL
  Filled 2012-12-19 (×5): qty 2

## 2012-12-19 MED ORDER — CEFAZOLIN SODIUM-DEXTROSE 2-3 GM-% IV SOLR
INTRAVENOUS | Status: DC | PRN
  Administered 2012-12-19: 2 g via INTRAVENOUS

## 2012-12-19 MED ORDER — OXYCODONE HCL 5 MG/5ML PO SOLN: 5.0000 mg | Freq: Once | ORAL | Status: DC | PRN

## 2012-12-19 MED ORDER — PIPERACILLIN-TAZOBACTAM 3.375 G IVPB
3.3750 g | Freq: Three times a day (TID) | INTRAVENOUS | Status: DC
  Administered 2012-12-19 – 2012-12-21 (×8): 3.375 g via INTRAVENOUS
  Filled 2012-12-19 (×10): qty 50

## 2012-12-19 MED ORDER — ONDANSETRON HCL 4 MG/2ML IJ SOLN: 4.0000 mg | Freq: Four times a day (QID) | INTRAMUSCULAR | Status: DC | PRN

## 2012-12-19 MED ORDER — ARTIFICIAL TEARS OP OINT
TOPICAL_OINTMENT | OPHTHALMIC | Status: DC | PRN
  Administered 2012-12-19: 1 via OPHTHALMIC

## 2012-12-19 MED ORDER — TRIAMTERENE-HCTZ 37.5-25 MG PO TABS
1.0000 | ORAL_TABLET | Freq: Every day | ORAL | Status: DC
  Administered 2012-12-19 – 2012-12-23 (×4): 1 via ORAL
  Filled 2012-12-19 (×5): qty 1

## 2012-12-19 MED ORDER — LIDOCAINE HCL (CARDIAC) 20 MG/ML IV SOLN
INTRAVENOUS | Status: DC | PRN
  Administered 2012-12-19: 100 mg via INTRAVENOUS

## 2012-12-19 MED ORDER — SODIUM CHLORIDE 0.45 % IV SOLN
INTRAVENOUS | Status: DC
  Administered 2012-12-19: 02:00:00 via INTRAVENOUS
  Filled 2012-12-19 (×3): qty 200

## 2012-12-19 MED ORDER — SODIUM CHLORIDE 0.9 % IR SOLN
Status: DC | PRN
  Administered 2012-12-19: 1000 mL

## 2012-12-19 MED ORDER — OXYCODONE HCL 5 MG PO TABS
5.0000 mg | ORAL_TABLET | ORAL | Status: DC | PRN
  Administered 2012-12-19 (×4): 10 mg via ORAL
  Administered 2012-12-20 (×3): 5 mg via ORAL
  Administered 2012-12-20: 10 mg via ORAL
  Administered 2012-12-20: 5 mg via ORAL
  Administered 2012-12-21 – 2012-12-23 (×11): 10 mg via ORAL
  Filled 2012-12-19 (×7): qty 2
  Filled 2012-12-19: qty 1
  Filled 2012-12-19 (×11): qty 2

## 2012-12-19 MED ORDER — METHOCARBAMOL 100 MG/ML IJ SOLN: 500.0000 mg | Freq: Four times a day (QID) | INTRAVENOUS | Status: DC | PRN

## 2012-12-19 MED ORDER — PANTOPRAZOLE SODIUM 40 MG PO TBEC
40.0000 mg | DELAYED_RELEASE_TABLET | Freq: Every day | ORAL | Status: DC
  Administered 2012-12-19 – 2012-12-23 (×4): 40 mg via ORAL
  Filled 2012-12-19 (×4): qty 1

## 2012-12-19 MED ORDER — MORPHINE SULFATE 2 MG/ML IJ SOLN
1.0000 mg | INTRAMUSCULAR | Status: DC | PRN
  Administered 2012-12-19 – 2012-12-20 (×3): 1 mg via INTRAVENOUS
  Filled 2012-12-19 (×3): qty 1

## 2012-12-19 MED ORDER — MIDAZOLAM HCL 5 MG/5ML IJ SOLN
INTRAMUSCULAR | Status: DC | PRN
  Administered 2012-12-18: 2 mg via INTRAVENOUS

## 2012-12-19 MED ORDER — OXYCODONE HCL 5 MG PO TABS: 5.0000 mg | ORAL_TABLET | Freq: Once | ORAL | Status: DC | PRN

## 2012-12-19 MED ORDER — METHOCARBAMOL 500 MG PO TABS
500.0000 mg | ORAL_TABLET | Freq: Four times a day (QID) | ORAL | Status: DC | PRN
  Administered 2012-12-20 – 2012-12-21 (×4): 500 mg via ORAL
  Filled 2012-12-19 (×4): qty 1

## 2012-12-19 MED ORDER — METOCLOPRAMIDE HCL 5 MG/ML IJ SOLN
INTRAMUSCULAR | Status: DC | PRN
  Administered 2012-12-19: 10 mg via INTRAVENOUS

## 2012-12-19 MED ORDER — BUPROPION HCL ER (XL) 300 MG PO TB24
300.0000 mg | ORAL_TABLET | Freq: Every day | ORAL | Status: DC
  Administered 2012-12-19 – 2012-12-23 (×4): 300 mg via ORAL
  Filled 2012-12-19 (×5): qty 1

## 2012-12-19 MED ORDER — SENNA 8.6 MG PO TABS
1.0000 | ORAL_TABLET | Freq: Two times a day (BID) | ORAL | Status: DC
  Administered 2012-12-19 – 2012-12-23 (×10): 8.6 mg via ORAL
  Filled 2012-12-19 (×16): qty 1

## 2012-12-19 MED ORDER — METOCLOPRAMIDE HCL 5 MG/ML IJ SOLN: 10.0000 mg | Freq: Once | INTRAMUSCULAR | Status: DC | PRN

## 2012-12-19 MED ORDER — ALPRAZOLAM 0.5 MG PO TABS
0.5000 mg | ORAL_TABLET | Freq: Four times a day (QID) | ORAL | Status: DC | PRN
  Administered 2012-12-20 – 2012-12-21 (×2): 0.5 mg via ORAL
  Filled 2012-12-19 (×2): qty 1

## 2012-12-19 MED ORDER — ATORVASTATIN CALCIUM 20 MG PO TABS
20.0000 mg | ORAL_TABLET | Freq: Every day | ORAL | Status: DC
  Administered 2012-12-19 – 2012-12-23 (×4): 20 mg via ORAL
  Filled 2012-12-19 (×5): qty 1

## 2012-12-19 MED ORDER — FENTANYL CITRATE 0.05 MG/ML IJ SOLN
INTRAMUSCULAR | Status: DC | PRN
  Administered 2012-12-18 – 2012-12-19 (×5): 50 ug via INTRAVENOUS

## 2012-12-19 MED ORDER — SUCCINYLCHOLINE CHLORIDE 20 MG/ML IJ SOLN
INTRAMUSCULAR | Status: DC | PRN
  Administered 2012-12-19: 100 mg via INTRAVENOUS

## 2012-12-19 MED ORDER — ONDANSETRON HCL 4 MG PO TABS: 4.0000 mg | ORAL_TABLET | Freq: Four times a day (QID) | ORAL | Status: DC | PRN

## 2012-12-19 MED ORDER — HYDROMORPHONE HCL PF 1 MG/ML IJ SOLN
INTRAMUSCULAR | Status: AC
  Filled 2012-12-19: qty 1

## 2012-12-19 MED ORDER — SODIUM CHLORIDE 0.45 % IV SOLN
INTRAVENOUS | Status: DC
  Administered 2012-12-19 – 2012-12-20 (×3): via INTRAVENOUS

## 2012-12-19 MED ORDER — VANCOMYCIN HCL IN DEXTROSE 1-5 GM/200ML-% IV SOLN
1000.0000 mg | Freq: Three times a day (TID) | INTRAVENOUS | Status: DC
  Administered 2012-12-19 – 2012-12-21 (×7): 1000 mg via INTRAVENOUS
  Filled 2012-12-19 (×12): qty 200

## 2012-12-19 MED ORDER — ONDANSETRON HCL 4 MG/2ML IJ SOLN
INTRAMUSCULAR | Status: DC | PRN
  Administered 2012-12-19: 4 mg via INTRAVENOUS

## 2012-12-19 MED ORDER — LACTATED RINGERS IV SOLN
INTRAVENOUS | Status: DC | PRN
  Administered 2012-12-18: via INTRAVENOUS

## 2012-12-19 MED ORDER — PROPOFOL 10 MG/ML IV BOLUS
INTRAVENOUS | Status: DC | PRN
  Administered 2012-12-19: 200 mg via INTRAVENOUS

## 2012-12-19 MED ORDER — HYDROMORPHONE HCL PF 1 MG/ML IJ SOLN
0.2500 mg | INTRAMUSCULAR | Status: DC | PRN
  Administered 2012-12-19 (×2): 0.5 mg via INTRAVENOUS

## 2012-12-19 MED ORDER — METOPROLOL TARTRATE 50 MG PO TABS
50.0000 mg | ORAL_TABLET | Freq: Two times a day (BID) | ORAL | Status: DC
  Administered 2012-12-19 – 2012-12-23 (×9): 50 mg via ORAL
  Filled 2012-12-19 (×14): qty 1

## 2012-12-19 NOTE — Progress Notes (Signed)
ANTIBIOTIC CONSULT NOTE - INITIAL  Pharmacy Consult for Vancomycin Indication: flexor tenosynovitis  No Known Allergies  Patient Measurements: Height: 6' (182.9 cm) Weight: 247 lb 6.4 oz (112.22 kg) IBW/kg (Calculated) : 77.6 Adjusted Body Weight: 100 kg  Vital Signs: Temp: 97.2 F (36.2 C) (11/19 0051) Temp src: Oral (11/18 2257) BP: 178/94 mmHg (11/19 0102) Pulse Rate: 106 (11/19 0102) Intake/Output from previous day: 11/18 0701 - 11/19 0700 In: 700 [I.V.:700] Out: -  Intake/Output from this shift: Total I/O In: 700 [I.V.:700] Out: -   Labs:  Recent Labs  12/18/12 1613  WBC 11.4*  HGB 16.4  PLT 246  CREATININE 1.12   Estimated Creatinine Clearance: 91.8 ml/min (by C-G formula based on Cr of 1.12). No results found for this basename: VANCOTROUGH, VANCOPEAK, VANCORANDOM, GENTTROUGH, GENTPEAK, GENTRANDOM, TOBRATROUGH, TOBRAPEAK, TOBRARND, AMIKACINPEAK, AMIKACINTROU, AMIKACIN,  in the last 72 hours   Microbiology: No results found for this or any previous visit (from the past 720 hour(s)).  Medical History: Past Medical History  Diagnosis Date  . Depression   . GERD (gastroesophageal reflux disease)   . Hyperlipidemia   . Hypertension     Medications:  Prescriptions prior to admission  Medication Sig Dispense Refill  . aspirin 81 MG tablet Take 81 mg by mouth daily.        Marland Kitchen buPROPion (WELLBUTRIN XL) 300 MG 24 hr tablet Take one tablet by mouth every day  as directed  30 tablet  2  . clotrimazole-betamethasone (LOTRISONE) cream APPLY TO RASH TWICE DAILY  45 g  11  . metoprolol (LOPRESSOR) 50 MG tablet Take 1 tablet (50 mg total) by mouth 2 (two) times daily.  100 tablet  3  . Multiple Vitamin (MULTI VITAMIN DAILY PO) Take 1 tablet by mouth daily.      . Omeprazole 20 MG TBEC Take 1 tablet (20 mg total) by mouth daily.  100 each  0  . rosuvastatin (CRESTOR) 10 MG tablet Take one tablet by mouth one time daily  30 tablet  2  . sildenafil (VIAGRA) 100 MG  tablet Take 1 tablet by mouth as needed for erectile dysfunction  6 tablet  2  . triamterene-hydrochlorothiazide (MAXZIDE-25) 37.5-25 MG per tablet Take one tablet by mouth one time daily  30 tablet  2   Assessment: 57 yo male with hand laceration s/p I & D for empiric antibiotics.  Vancomycin 1 g IV in ED at 1830  Goal of Therapy:  Vancomycin trough level 10-15 mcg/ml  Plan:  Vancomycin 1 g IV q8h  Arlynn Stare, Gary Fleet 12/19/2012,1:14 AM

## 2012-12-19 NOTE — Op Note (Signed)
NAMEDEMERE, DOTZLER                ACCOUNT NO.:  [id number]  MEDICAL RECORD NO.:  [id number]  LOCATION:  [id number]                        FACILITY:  [facility]  PHYSICIAN:  Dionne Ano. Ayannah Faddis, M.D.DATE OF BIRTH:  1955-07-22  DATE OF PROCEDURE: DATE OF DISCHARGE:                              OPERATIVE REPORT   PREOPERATIVE DIAGNOSIS:  Left middle finger laceration with flexor tenosynovitis, purulent in nature.  POSTOPERATIVE DIAGNOSIS:  Left middle finger laceration with flexor tenosynovitis, purulent in nature.  PROCEDURE: 1. I and D deep abscess, left middle finger. 2. Extensive flexor tenosynovectomy, FDP and FDS tendon, right middle     finger through 2 separate incisions. 3. Placement of closed sheath irrigation system secondary to purulent     flexor tenosynovitis.  SURGEON:  Dionne Ano. Amanda Pea, M.D.  ASSISTANT:  Karie Chimera, PA-C.  COMPLICATIONS:  None.  ANESTHESIA:  General.  TOURNIQUET TIME:  Less than 20 minutes.  INDICATIONS:  This is a 57 year old male who is noted to have the above- mentioned diagnosis.  I have counseled him in regard to the risks and benefits of surgery and he desires to proceed with the above-mentioned operative intervention.  OPERATION IN DETAIL:  The patient was seen by myself and anesthesia. Taken to the operative suite.  Underwent smooth induction of general anesthesia, placed in supine position.  Prepped and draped in usual sterile fashion.  Once this was done, we opened the transverse incision over his PIP joint, enlarged this immediately.  We encountered foul purulent material.  This was cultured for aerobic and anaerobic culture. Following this, it was very apparent that the sheath infection has spread proximally and thus we made an incision over the distal palmar crease, modified Brunner in nature.  Dissection was carried down.  The A1 pulley was released.  Flexor tenosynovectomy was accomplished about the FDP and FDS tendons.  The  patient tolerated this well.  This was a radical tenosynovectomy of the FDP and FDS tendons.  Following this and debridement, we then performed placement of a pediatric feeding catheter and irrigated the wounds copiously.  We then hooked the feeding catheter so that we can provide a closed system drainage for the extremity.  We will plan a second look in 36-48 hours in the operative arena and I have discussed this with his family.  This was a very purulent infected hand, unfortunately involved the flexor sheath system, which is known as purulent flexor tenosynovitis. We will monitor his condition closely.  All questions have been addressed.     Dionne Ano. Amanda Pea, M.D.     [facility]  D:  12/19/2012  T:  12/19/2012  Job:  [id number]  cc:   Neta Mends. Fabian Sharp, MD

## 2012-12-19 NOTE — Anesthesia Postprocedure Evaluation (Signed)
Anesthesia Post Note  Patient: Antonio Cooper  Procedure(s) Performed: Procedure(s) (LRB): I&D Left Middle Finger With Repair Recons.Prn (Left)  Anesthesia type: General  Patient location: PACU  Post pain: Pain level controlled  Post assessment: Patient's Cardiovascular Status Stable  Last Vitals:  Filed Vitals:   12/19/12 0118  BP: 168/87  Pulse: 108  Temp: 36.2 C  Resp: 13    Post vital signs: Reviewed and stable  Level of consciousness: alert  Complications: No apparent anesthesia complications

## 2012-12-19 NOTE — Transfer of Care (Signed)
Immediate Anesthesia Transfer of Care Note  Patient: Antonio Cooper  Procedure(s) Performed: Procedure(s): I&D Left Middle Finger With Repair Recons.Prn (Left)  Patient Location: PACU  Anesthesia Type:General  Level of Consciousness: awake, alert  and oriented  Airway & Oxygen Therapy: Patient Spontanous Breathing and Patient connected to nasal cannula oxygen  Post-op Assessment: Report given to PACU RN, Post -op Vital signs reviewed and stable and Patient moving all extremities  Post vital signs: Reviewed and stable  Complications: No apparent anesthesia complications

## 2012-12-19 NOTE — Progress Notes (Signed)
Patient ID: Antonio Cooper, male   DOB: 12-25-1955, 56 y.o.   MRN: [id number] Patient is postop day 1 status post I&D infectious long finger flexor tenosynovitis.  He is awake alert and oriented. He has no signs of compartment syndrome or necrotizing fasciitis.  Vital signs are stable. He is tolerating a regular diet.  His examination shows good early range of motion. Unfortunately he removed his drain while he was getting up to use the bathroom. This is unfortunate but nevertheless we'll plan for second washout tomorrow and we will be able to ascertain how much improvement he is made at that time.  For now I discussed with him that our goal is range of motion awaiting cultures antibiotic administration and meticulous wound care.  He has no signs of DVT infection in the lower extremities or vascular compromise. Abdomen is nontender chest is clear HEENT stay normal limits.  Impression status post I&D infectious tenosynovitis left middle finger  Dominica Severin MD

## 2012-12-19 NOTE — Progress Notes (Signed)
0981 Patient IV pump was beeping notice that tubing was not present in patients dressing to left hand. Patient ask what happen to the tube in his hand. Patient stated I think I must have pull it out when I was trying to use the urinal. Patient stated it just happen. 0630 Dr. Amanda Pea answering service notified. 1914 Dr. Amanda Pea return call and told of patient pulling tubing out. Dr. Amanda Pea stated that that affect his recovery and that it could not be replaced and he would do surgery on him tomorrow. I told the patient what Dr. Amanda Pea stated. Patient stated that he now thinks he pull it out when he was awaken by lab and was trying to sit up for her to draw is labs. He stated then I use the urinal. Patient verbalize understanding that pulling the tubing out may impede his healing of his hand.

## 2012-12-19 NOTE — Progress Notes (Signed)
Physical Therapy Discharge Patient Details Name: Antonio Cooper MRN: [id number] DOB: 16-Sep-1955 Today's Date: 12/19/2012 Time: 4098-1191 PT Time Calculation (min): 26 min  Patient discharged from PT services secondary to No acute PT needs.  Please see latest therapy progress note for current level of functioning and progress toward goals.    Progress and discharge plan discussed with patient and/or caregiver: Patient/Caregiver agrees with plan  GP     Fabio Asa 12/19/2012, 9:13 AM

## 2012-12-19 NOTE — Op Note (Signed)
See Dictation #[id number] Amanda Pea MD

## 2012-12-19 NOTE — Anesthesia Procedure Notes (Signed)
Procedure Name: Intubation Date/Time: 12/19/2012 12:03 AM Performed by: Luster Landsberg Pre-anesthesia Checklist: Patient identified, Emergency Drugs available, Suction available and Patient being monitored Patient Re-evaluated:Patient Re-evaluated prior to inductionOxygen Delivery Method: Circle system utilized Preoxygenation: Pre-oxygenation with 100% oxygen Intubation Type: IV induction, Rapid sequence and Cricoid Pressure applied Laryngoscope Size: Mac and 4 Grade View: Grade II Tube type: Oral Tube size: 7.5 mm Number of attempts: 1 Airway Equipment and Method: Stylet Placement Confirmation: ETT inserted through vocal cords under direct vision,  positive ETCO2 and breath sounds checked- equal and bilateral Secured at: 23 cm Tube secured with: Tape Dental Injury: Teeth and Oropharynx as per pre-operative assessment

## 2012-12-19 NOTE — Progress Notes (Signed)
Occupational Therapy Evaluation Patient Details Name: Antonio Cooper MRN: [id number] DOB: 1955-06-28 Today's Date: 12/19/2012 Time: 1191-4782 OT Time Calculation (min): 24 min  OT Assessment / Plan / Recommendation History of present illness The patient is a pleasant 57 year old male presented to the emergency room today for evaluation of his left middle finger. He sustained a laceration this Sunday while removing a light bulb the bulb broke and the glass subsequently cut him. He sustained a proximal 1 cm laceration of the volar aspect of his PIP about the left middle finger. He states initially he had no significant pain but wound did bleed a great deal. Unfortunately the past few days he's had increasing pain, swelling, erythema and eyes at the extent he has had significant pain and notices significant redness and difficulty flexing or extending her finger., Pt is now s/p I and D.   Clinical Impression   Patient is s/p I & D Lt 3rd digit Extensive flexor tenosynovectomy, FDP and FDS tendon, right middle  finger through 2 separate incisions with surgery resulting in functional limitations due to the deficits listed below (see OT problem list). Pt is left hand dominant and needs cues to avoid use of LT UE at this time Patient will benefit from skilled OT acutely to increase independence and safety with ADLS to allow discharge follow up with Dr Amanda Pea progressing to hand therapy in a few weeks. Ot to follow pending possible second surgery per progress notes.      OT Assessment  Patient needs continued OT Services    Follow Up Recommendations  Other (comment) (recommend hand therapy after follow up with Dr Amanda Pea)    Barriers to Discharge Decreased caregiver support LIVES ALONE - has family that can help with house setup  Equipment Recommendations  None recommended by OT    Recommendations for Other Services    Frequency  Min 2X/week    Precautions / Restrictions Precautions Precautions:  None Restrictions Weight Bearing Restrictions: Yes LUE Weight Bearing: Non weight bearing Other Position/Activity Restrictions: keep elevated   Pertinent Vitals/Pain 4 out 10 in hand Elevated on pillows and ice applied    ADL  Grooming: Wash/dry face;Teeth care;Set up Where Assessed - Grooming: Unsupported standing (encouraged to buy electric tooth brush for oral care) Upper Body Dressing: Minimal assistance Where Assessed - Upper Body Dressing:  (educated to dress lt UE first) Toilet Transfer: Supervision/safety Acupuncturist: Regular height toilet Toileting - Architect and Hygiene: Supervision/safety Where Assessed - Engineer, mining and Hygiene: Sit to stand from 3-in-1 or toilet Equipment Used: Gait belt ADL Comments: Pt educated on elevation and ice at this time. pt encouraged to keep LT UE elevated against body with mobility. pt with incr pain as day progressing. Pt with removal of IV pain medication to LT UE. Question pending second surg to replace ??? OT to continue to follow pending second surg needs. Pt very left dominant and needed cues throughout session to guard LT UE. Pt talks with hands and unaware due to decr sensation guarding Lt UE    OT Diagnosis: Generalized weakness;Acute pain  OT Problem List: Decreased range of motion;Decreased strength;Impaired UE functional use;Decreased knowledge of precautions OT Treatment Interventions: Self-care/ADL training;Therapeutic exercise;DME and/or AE instruction;Therapeutic activities;Balance training;Patient/family education   OT Goals(Current goals can be found in the care plan section) Acute Rehab OT Goals Patient Stated Goal: to get back to work OT Goal Formulation: With patient Time For Goal Achievement: 01/02/13 Potential to Achieve Goals: Good  Visit Information  Last OT Received On: 12/19/12 Assistance Needed: +1 History of Present Illness: HPI: The patient is a pleasant 54-year-old  male presented to the emergency room today for evaluation of his left middle finger. He sustained a laceration this Sunday while removing a light bulb the bulb broke and the glass subsequently cut him. He sustained a proximal 1 cm laceration of the volar aspect of his PIP about the left middle finger. He states initially he had no significant pain but wound did bleed a great deal. Unfortunately the past few days he's had increasing pain, swelling, erythema and eyes at the extent he has had significant pain and notices significant redness and difficulty flexing or extending her finger., Pt is now s/p I and D.       Prior Functioning     Home Living Family/patient expects to be discharged to:: Private residence Living Arrangements: Alone Available Help at Discharge: Family;Available PRN/intermittently Type of Home: House Home Access: Level entry Home Layout: One level Home Equipment: None Additional Comments: walkin in shower Prior Function Level of Independence: Independent Communication Communication: No difficulties (reading glasses) Dominant Hand: Left         Vision/Perception Vision - History Baseline Vision: Wears glasses only for reading Patient Visual Report: No change from baseline   Cognition  Cognition Arousal/Alertness: Awake/alert Behavior During Therapy: WFL for tasks assessed/performed Overall Cognitive Status: Within Functional Limits for tasks assessed    Extremity/Trunk Assessment Upper Extremity Assessment Upper Extremity Assessment: LUE deficits/detail LUE Deficits / Details: pt currently with numbness in hand and minimal ROM due to medication. Pt able to complete pincher grasp with thumb and 2nd digit. Pt encouraged to use RT hand. Pt very left hand dominant. Pt needed reinforcement to avoid weight bearing LUE Sensation: decreased light touch LUE Coordination: decreased fine motor;decreased gross motor Lower Extremity Assessment Lower Extremity  Assessment: Defer to PT evaluation Cervical / Trunk Assessment Cervical / Trunk Assessment: Normal     Mobility Bed Mobility Bed Mobility: Supine to Sit;Sitting - Scoot to Edge of Bed Supine to Sit: 6: Modified independent (Device/Increase time) Sitting - Scoot to Edge of Bed: 6: Modified independent (Device/Increase time) Transfers Sit to Stand: 5: Supervision Stand to Sit: 5: Supervision Details for Transfer Assistance: Vcs to not use LUE (hand) to push to standing, Cues for position of LUE     Exercise     Balance High Level Balance High Level Balance Activites: Side stepping;Backward walking;Direction changes;Turns High Level Balance Comments: modest instability, no significant LOB    End of Session OT - End of Session Activity Tolerance: Patient tolerated treatment well Patient left: in chair;with call bell/phone within reach Nurse Communication: Precautions  GO     Boone Master B 12/19/2012, 10:14 AM Pager: [phone]

## 2012-12-19 NOTE — Evaluation (Signed)
Physical Therapy Evaluation Patient Details Name: Antonio Cooper MRN: [id number] DOB: 21-Jul-1955 Today's Date: 12/19/2012 Time: 4098-1191 PT Time Calculation (min): 26 min  PT Assessment / Plan / Recommendation History of Present Illness  HPI: The patient is a pleasant 57-year-old male presented to the emergency room today for evaluation of his left middle finger. He sustained a laceration this Sunday while removing a light bulb the bulb broke and the glass subsequently cut him. He sustained a proximal 1 cm laceration of the volar aspect of his PIP about the left middle finger. He states initially he had no significant pain but wound did bleed a great deal. Unfortunately the past few days he's had increasing pain, swelling, erythema and eyes at the extent he has had significant pain and notices significant redness and difficulty flexing or extending her finger., Pt is now s/p I and D.  Clinical Impression  Patient demonstrates no acute PT needs, all needs to be addressed with OT moving forward. PT will sign off, patient in agreement.    PT Assessment  Patent does not need any further PT services    Follow Up Recommendations  No PT follow up          Equipment Recommendations  None recommended by PT          Precautions / Restrictions Restrictions Weight Bearing Restrictions: Yes LUE Weight Bearing: Non weight bearing Other Position/Activity Restrictions: keep elevated   Pertinent Vitals/Pain 4/10      Mobility  Bed Mobility Bed Mobility: Supine to Sit;Sitting - Scoot to Edge of Bed Supine to Sit: 6: Modified independent (Device/Increase time) Sitting - Scoot to Edge of Bed: 6: Modified independent (Device/Increase time) Transfers Transfers: Sit to Stand;Stand to Sit Sit to Stand: 5: Supervision Stand to Sit: 5: Supervision Details for Transfer Assistance: Vcs to not use LUE (hand) to push to standing, Cues for position of LUE Ambulation/Gait Ambulation/Gait Assistance:  5: Supervision Ambulation Distance (Feet): 260 Feet Assistive device: None Ambulation/Gait Assistance Details: some instability noted, VCs for position of LUE during ambulation Gait Pattern: Within Functional Limits Gait velocity: decreased modestly       PT Goals(Current goals can be found in the care plan section) Acute Rehab PT Goals PT Goal Formulation: No goals set, d/c therapy  Visit Information  Last PT Received On: 12/19/12 Assistance Needed: +1 History of Present Illness: HPI: The patient is a pleasant 57-year-old male presented to the emergency room today for evaluation of his left middle finger. He sustained a laceration this Sunday while removing a light bulb the bulb broke and the glass subsequently cut him. He sustained a proximal 1 cm laceration of the volar aspect of his PIP about the left middle finger. He states initially he had no significant pain but wound did bleed a great deal. Unfortunately the past few days he's had increasing pain, swelling, erythema and eyes at the extent he has had significant pain and notices significant redness and difficulty flexing or extending her finger., Pt is now s/p I and D.       Prior Functioning  Home Living Family/patient expects to be discharged to:: Private residence Living Arrangements: Alone Available Help at Discharge: Family;Available PRN/intermittently Type of Home: House Home Access: Level entry Home Layout: One level Home Equipment: None Additional Comments: walkin in shower Prior Function Level of Independence: Independent Communication Communication: No difficulties (reading glasses) Dominant Hand: Left    Cognition  Cognition Arousal/Alertness: Awake/alert Behavior During Therapy: WFL for tasks assessed/performed Overall  Cognitive Status: Within Functional Limits for tasks assessed    Extremity/Trunk Assessment Upper Extremity Assessment Upper Extremity Assessment: Defer to OT evaluation Lower Extremity  Assessment Lower Extremity Assessment: Overall WFL for tasks assessed   Balance High Level Balance High Level Balance Activites: Side stepping;Backward walking;Direction changes;Turns High Level Balance Comments: modest instability, no significant LOB   End of Session PT - End of Session Equipment Utilized During Treatment: Gait belt Activity Tolerance: Patient tolerated treatment well Patient left: in bed;with call bell/phone within reach;with family/visitor present (seated EOB) Nurse Communication: Mobility status  GP     Fabio Asa 12/19/2012, 9:11 AM Charlotte Crumb, PT DPT  [phone]

## 2012-12-20 ENCOUNTER — Encounter (HOSPITAL_COMMUNITY): Payer: Self-pay | Admitting: Critical Care Medicine

## 2012-12-20 ENCOUNTER — Encounter (HOSPITAL_COMMUNITY): Payer: PRIVATE HEALTH INSURANCE | Admitting: Anesthesiology

## 2012-12-20 ENCOUNTER — Inpatient Hospital Stay (HOSPITAL_COMMUNITY): Payer: PRIVATE HEALTH INSURANCE | Admitting: Anesthesiology

## 2012-12-20 ENCOUNTER — Encounter (HOSPITAL_COMMUNITY)
Admission: EM | Disposition: A | Discharge status: Home / Self Care | Payer: Self-pay | Source: Home / Self Care | Attending: Orthopedic Surgery

## 2012-12-20 HISTORY — PX: I & D EXTREMITY: SHX5045

## 2012-12-20 LAB — SURGICAL PCR SCREEN: Staphylococcus aureus: POSITIVE — AB

## 2012-12-20 SURGERY — IRRIGATION AND DEBRIDEMENT EXTREMITY
Anesthesia: General | Site: Hand | Laterality: Left | Wound class: Dirty or Infected

## 2012-12-20 MED ORDER — OXYCODONE HCL 5 MG PO TABS
ORAL_TABLET | ORAL | Status: AC
  Filled 2012-12-20: qty 1

## 2012-12-20 MED ORDER — HYDROMORPHONE HCL PF 1 MG/ML IJ SOLN
INTRAMUSCULAR | Status: AC
  Administered 2012-12-20: 0.5 mg via INTRAVENOUS
  Filled 2012-12-20: qty 1

## 2012-12-20 MED ORDER — HYDROMORPHONE HCL PF 1 MG/ML IJ SOLN
0.2500 mg | INTRAMUSCULAR | Status: DC | PRN
  Administered 2012-12-20 (×2): 0.5 mg via INTRAVENOUS

## 2012-12-20 MED ORDER — CHLORHEXIDINE GLUCONATE CLOTH 2 % EX PADS: 6.0000 | MEDICATED_PAD | Freq: Every day | CUTANEOUS | Status: DC

## 2012-12-20 MED ORDER — LACTATED RINGERS IV SOLN
INTRAVENOUS | Status: DC | PRN
  Administered 2012-12-20: 17:00:00 via INTRAVENOUS

## 2012-12-20 MED ORDER — SODIUM CHLORIDE 0.45 % IV SOLN
Freq: Once | INTRAVENOUS | Status: AC
  Administered 2012-12-20: 19:00:00 via INTRAVENOUS
  Filled 2012-12-20 (×2): qty 200

## 2012-12-20 MED ORDER — SODIUM CHLORIDE 0.9 % IR SOLN
Status: DC | PRN
  Administered 2012-12-20 (×2): 1000 mL

## 2012-12-20 MED ORDER — LACTATED RINGERS IV SOLN
INTRAVENOUS | Status: DC
  Administered 2012-12-20: 16:00:00 via INTRAVENOUS

## 2012-12-20 MED ORDER — SODIUM CHLORIDE 0.45 % IV SOLN
INTRAVENOUS | Status: DC
  Filled 2012-12-20: qty 200

## 2012-12-20 MED ORDER — PROPOFOL 10 MG/ML IV BOLUS
INTRAVENOUS | Status: DC | PRN
  Administered 2012-12-20: 250 mg via INTRAVENOUS

## 2012-12-20 MED ORDER — LIDOCAINE HCL (CARDIAC) 20 MG/ML IV SOLN
INTRAVENOUS | Status: DC | PRN
  Administered 2012-12-20: 100 mg via INTRAVENOUS

## 2012-12-20 MED ORDER — ONDANSETRON HCL 4 MG/2ML IJ SOLN
INTRAMUSCULAR | Status: DC | PRN
  Administered 2012-12-20: 4 mg via INTRAVENOUS

## 2012-12-20 MED ORDER — FENTANYL CITRATE 0.05 MG/ML IJ SOLN
INTRAMUSCULAR | Status: DC | PRN
  Administered 2012-12-20 (×2): 100 ug via INTRAVENOUS
  Administered 2012-12-20: 50 ug via INTRAVENOUS

## 2012-12-20 MED ORDER — MUPIROCIN 2 % EX OINT
1.0000 "application " | TOPICAL_OINTMENT | Freq: Two times a day (BID) | CUTANEOUS | Status: DC
  Administered 2012-12-21 (×2): 1 via NASAL
  Filled 2012-12-20 (×2): qty 22

## 2012-12-20 MED ORDER — ONDANSETRON HCL 4 MG/2ML IJ SOLN
4.0000 mg | Freq: Once | INTRAMUSCULAR | Status: DC | PRN
Start: 2012-12-20 — End: 2012-12-20

## 2012-12-20 SURGICAL SUPPLY — 42 items
BANDAGE CONFORM 2  STR LF (GAUZE/BANDAGES/DRESSINGS) IMPLANT
BANDAGE ELASTIC 4 VELCRO ST LF (GAUZE/BANDAGES/DRESSINGS) ×2 IMPLANT
BANDAGE GAUZE ELAST BULKY 4 IN (GAUZE/BANDAGES/DRESSINGS) ×2 IMPLANT
CLOTH BEACON ORANGE TIMEOUT ST (SAFETY) ×2 IMPLANT
CORDS BIPOLAR (ELECTRODE) ×2 IMPLANT
CUFF TOURNIQUET SINGLE 18IN (TOURNIQUET CUFF) ×2 IMPLANT
CUFF TOURNIQUET SINGLE 24IN (TOURNIQUET CUFF) IMPLANT
DRSG ADAPTIC 3X8 NADH LF (GAUZE/BANDAGES/DRESSINGS) ×2 IMPLANT
ELECT REM PT RETURN 9FT ADLT (ELECTROSURGICAL)
ELECTRODE REM PT RTRN 9FT ADLT (ELECTROSURGICAL) IMPLANT
GAUZE XEROFORM 1X8 LF (GAUZE/BANDAGES/DRESSINGS) ×2 IMPLANT
GAUZE XEROFORM 5X9 LF (GAUZE/BANDAGES/DRESSINGS) ×1 IMPLANT
GLOVE BIOGEL M STRL SZ7.5 (GLOVE) ×2 IMPLANT
GLOVE SS BIOGEL STRL SZ 8 (GLOVE) ×1 IMPLANT
GLOVE SUPERSENSE BIOGEL SZ 8 (GLOVE) ×1
GOWN PREVENTION PLUS XLARGE (GOWN DISPOSABLE) ×2 IMPLANT
GOWN STRL NON-REIN LRG LVL3 (GOWN DISPOSABLE) ×6 IMPLANT
GOWN STRL REIN XL XLG (GOWN DISPOSABLE) ×4 IMPLANT
HANDPIECE INTERPULSE COAX TIP (DISPOSABLE)
KIT BASIN OR (CUSTOM PROCEDURE TRAY) ×2 IMPLANT
KIT ROOM TURNOVER OR (KITS) ×2 IMPLANT
MANIFOLD NEPTUNE II (INSTRUMENTS) ×2 IMPLANT
NDL HYPO 25GX1X1/2 BEV (NEEDLE) IMPLANT
NEEDLE HYPO 25GX1X1/2 BEV (NEEDLE) IMPLANT
NS IRRIG 1000ML POUR BTL (IV SOLUTION) ×2 IMPLANT
PACK ORTHO EXTREMITY (CUSTOM PROCEDURE TRAY) ×2 IMPLANT
PAD ARMBOARD 7.5X6 YLW CONV (MISCELLANEOUS) ×4 IMPLANT
PAD CAST 4YDX4 CTTN HI CHSV (CAST SUPPLIES) ×1 IMPLANT
PADDING CAST COTTON 4X4 STRL (CAST SUPPLIES) ×2
SET HNDPC FAN SPRY TIP SCT (DISPOSABLE) IMPLANT
SPONGE GAUZE 4X4 12PLY (GAUZE/BANDAGES/DRESSINGS) ×2 IMPLANT
SPONGE LAP 18X18 X RAY DECT (DISPOSABLE) ×2 IMPLANT
SPONGE LAP 4X18 X RAY DECT (DISPOSABLE) ×2 IMPLANT
SYR CONTROL 10ML LL (SYRINGE) ×1 IMPLANT
TOWEL OR 17X24 6PK STRL BLUE (TOWEL DISPOSABLE) ×2 IMPLANT
TOWEL OR 17X26 10 PK STRL BLUE (TOWEL DISPOSABLE) ×2 IMPLANT
TUBE ANAEROBIC SPECIMEN COL (MISCELLANEOUS) IMPLANT
TUBE CONNECTING 12X1/4 (SUCTIONS) ×2 IMPLANT
TUBE FEEDING 5FR 15 INCH (TUBING) ×1 IMPLANT
TUBING CYSTO DISP (UROLOGICAL SUPPLIES) ×1 IMPLANT
WATER STERILE IRR 1000ML POUR (IV SOLUTION) ×2 IMPLANT
YANKAUER SUCT BULB TIP NO VENT (SUCTIONS) ×2 IMPLANT

## 2012-12-20 NOTE — Anesthesia Preprocedure Evaluation (Addendum)
Anesthesia Evaluation  Patient identified by MRN, date of birth, ID band Patient awake    Reviewed: Allergy & Precautions, H&P , NPO status , Patient's Chart, lab work & pertinent test results, reviewed documented beta blocker date and time   Airway       Dental   Pulmonary former smoker,          Cardiovascular hypertension, Pt. on home beta blockers     Neuro/Psych PSYCHIATRIC DISORDERS Depression    GI/Hepatic GERD-  ,  Endo/Other    Renal/GU      Musculoskeletal   Abdominal   Peds  Hematology   Anesthesia Other Findings   Reproductive/Obstetrics                          Anesthesia Physical Anesthesia Plan  ASA: II  Anesthesia Plan: General   Post-op Pain Management:    Induction: Intravenous  Airway Management Planned: LMA  Additional Equipment:   Intra-op Plan:   Post-operative Plan: Extubation in OR  Informed Consent: I have reviewed the patients History and Physical, chart, labs and discussed the procedure including the risks, benefits and alternatives for the proposed anesthesia with the patient or authorized representative who has indicated his/her understanding and acceptance.     Plan Discussed with: CRNA, Anesthesiologist and Surgeon  Anesthesia Plan Comments:         Anesthesia Quick Evaluation

## 2012-12-20 NOTE — Transfer of Care (Signed)
Immediate Anesthesia Transfer of Care Note  Patient: Antonio Cooper  Procedure(s) Performed: Procedure(s): LEFT HAND Incision and drainage  TO INCLUDE MIDDLE FINGER FLEXOR TENDON (Left)  Patient Location: PACU  Anesthesia Type:General  Level of Consciousness: awake, alert  and oriented  Airway & Oxygen Therapy: Patient Spontanous Breathing and Patient connected to nasal cannula oxygen  Post-op Assessment: Report given to PACU RN and Post -op Vital signs reviewed and stable  Post vital signs: Reviewed and stable  Complications: No apparent anesthesia complications

## 2012-12-20 NOTE — Anesthesia Procedure Notes (Signed)
Procedure Name: LMA Insertion Date/Time: 12/20/2012 4:58 PM Performed by: Coralee Rud Pre-anesthesia Checklist: Patient identified, Emergency Drugs available, Suction available and Patient being monitored Patient Re-evaluated:Patient Re-evaluated prior to inductionOxygen Delivery Method: Circle system utilized Preoxygenation: Pre-oxygenation with 100% oxygen Intubation Type: IV induction Ventilation: Mask ventilation without difficulty LMA: LMA inserted LMA Size: 5.0 Number of attempts: 1

## 2012-12-20 NOTE — Progress Notes (Signed)
1/2 NS and lidocaine infusion connected to pediatric tube in patient's Left arm at 10 mls per hour.

## 2012-12-20 NOTE — Progress Notes (Signed)
Patient ID: Antonio Cooper, male   DOB: [birth year], 57 y.o.   MRN: [id number] The patient is seen and examined patient is ready for repeat ID.   At the present time we'll plan for repeat irrigation and debridement left hand reconstruction as necessary.  We are waiting final culture results.  All questions answered  Shone Leventhal MD

## 2012-12-20 NOTE — Op Note (Signed)
See dictation #[id number] Amanda Pea MD

## 2012-12-20 NOTE — Anesthesia Postprocedure Evaluation (Signed)
  Anesthesia Post-op Note  Patient: Antonio Cooper  Procedure(s) Performed: Procedure(s): LEFT HAND Incision and drainage  TO INCLUDE MIDDLE FINGER FLEXOR TENDON (Left)  Patient Location: PACU  Anesthesia Type:General  Level of Consciousness: awake and alert   Airway and Oxygen Therapy: Patient Spontanous Breathing  Post-op Pain: mild  Post-op Assessment: Post-op Vital signs reviewed, Patient's Cardiovascular Status Stable and Respiratory Function Stable  Post-op Vital Signs: Reviewed  Filed Vitals:   12/20/12 1900  BP:   Pulse: 84  Temp:   Resp: 14    Complications: No apparent anesthesia complications

## 2012-12-20 NOTE — Preoperative (Signed)
Beta Blockers   Reason not to administer Beta Blockers:Not Applicable, pt took lopressor 11/20 @0940 

## 2012-12-20 NOTE — OR Nursing (Signed)
12/20/12 @ 1730 Dr Amanda Pea placed a 5 fr feeding tube in the left hand for future irrigation.

## 2012-12-21 ENCOUNTER — Encounter (HOSPITAL_COMMUNITY): Payer: Self-pay | Admitting: Orthopedic Surgery

## 2012-12-21 LAB — CBC WITH DIFFERENTIAL/PLATELET
Eosinophils Relative: 2 % (ref 0–5)
HCT: 39.5 % (ref 39.0–52.0)
Lymphocytes Relative: 39 % (ref 12–46)
Lymphs Abs: 2.8 10*3/uL (ref 0.7–4.0)
MCHC: 33.2 g/dL (ref 30.0–36.0)
MCV: 96.3 fL (ref 78.0–100.0)
Monocytes Absolute: 0.6 10*3/uL (ref 0.1–1.0)
Neutro Abs: 3.6 10*3/uL (ref 1.7–7.7)
RBC: 4.1 MIL/uL — ABNORMAL LOW (ref 4.22–5.81)
WBC: 7.1 10*3/uL (ref 4.0–10.5)

## 2012-12-21 LAB — WOUND CULTURE

## 2012-12-21 LAB — COMPREHENSIVE METABOLIC PANEL
ALT: 18 U/L (ref 0–53)
AST: 23 U/L (ref 0–37)
Alkaline Phosphatase: 72 U/L (ref 39–117)
BUN: 13 mg/dL (ref 6–23)
CO2: 28 mEq/L (ref 19–32)
Calcium: 9 mg/dL (ref 8.4–10.5)
Creatinine, Ser: 1.09 mg/dL (ref 0.50–1.35)
GFR calc Af Amer: 84 mL/min — ABNORMAL LOW (ref >90)
GFR calc non Af Amer: 72 mL/min — ABNORMAL LOW (ref >90)
Glucose, Bld: 108 mg/dL — ABNORMAL HIGH (ref 70–99)
Potassium: 3.5 mEq/L (ref 3.5–5.1)
Total Bilirubin: 0.7 mg/dL (ref 0.3–1.2)

## 2012-12-21 MED ORDER — CEFAZOLIN SODIUM 1-5 GM-% IV SOLN
1.0000 g | Freq: Three times a day (TID) | INTRAVENOUS | Status: DC
  Administered 2012-12-21 – 2012-12-23 (×7): 1 g via INTRAVENOUS
  Filled 2012-12-21 (×9): qty 50

## 2012-12-21 MED ORDER — SODIUM CHLORIDE 0.45 % IV SOLN
Freq: Once | INTRAVENOUS | Status: AC
  Administered 2012-12-21: 22:00:00
  Filled 2012-12-21: qty 200

## 2012-12-21 MED ORDER — SODIUM CHLORIDE 0.45 % IV SOLN: Freq: Once | INTRAVENOUS | Status: DC

## 2012-12-21 NOTE — Progress Notes (Signed)
ANTIBIOTIC CONSULT NOTE - FOLLOW UP  Pharmacy Consult:  Vancomycin Indication:  Flexor tenosynovitis  No Known Allergies  Patient Measurements: Height: 6' (182.9 cm) Weight: 248 lb 10.9 oz (112.8 kg) IBW/kg (Calculated) : 77.6  Vital Signs: Temp: 98.1 F (36.7 C) (11/21 0600) Temp src: Oral (11/21 0600) BP: 129/60 mmHg (11/21 0600) Pulse Rate: 68 (11/21 0600) Intake/Output from previous day: 11/20 0701 - 11/21 0700 In: 2257 [I.V.:1257; IV Piggyback:1000] Out: -  Intake/Output from this shift: Total I/O In: 120 [P.O.:120] Out: -   Labs:  Recent Labs  12/18/12 1613 12/19/12 0603 12/21/12 0719  WBC 11.4* 10.3 7.1  HGB 16.4 14.2 13.1  PLT 246 224 219  CREATININE 1.12  --  1.09   Estimated Creatinine Clearance: 94.6 ml/min (by C-G formula based on Cr of 1.09). No results found for this basename: VANCOTROUGH, Leodis Binet, VANCORANDOM, GENTTROUGH, GENTPEAK, GENTRANDOM, TOBRATROUGH, TOBRAPEAK, TOBRARND, AMIKACINPEAK, AMIKACINTROU, AMIKACIN,  in the last 72 hours   Microbiology: Recent Results (from the past 720 hour(s))  ANAEROBIC CULTURE     Status: None   Collection Time    12/18/12 10:30 PM      Result Value Range Status   Specimen Description WOUND LEFT FINGER   Final   Special Requests NONE   Final   Gram Stain     Final   Value: FEW WBC PRESENT, PREDOMINANTLY PMN     NO SQUAMOUS EPITHELIAL CELLS SEEN     NO ORGANISMS SEEN     Performed at [facility]   Culture     Final   Value: NO ANAEROBES ISOLATED; CULTURE IN PROGRESS FOR 5 DAYS     Performed at [facility]   Report Status PENDING   Incomplete  WOUND CULTURE     Status: None   Collection Time    12/18/12 10:30 PM      Result Value Range Status   Specimen Description WOUND LEFT FINGER   Final   Special Requests NONE   Final   Gram Stain     Final   Value: FEW WBC PRESENT, PREDOMINANTLY PMN     NO SQUAMOUS EPITHELIAL CELLS SEEN     NO ORGANISMS SEEN     Performed at [facility]   Culture     Final   Value: FEW STAPHYLOCOCCUS AUREUS     Note: RIFAMPIN AND GENTAMICIN SHOULD NOT BE USED AS SINGLE DRUGS FOR TREATMENT OF STAPH INFECTIONS.     Performed at [facility]   Report Status 12/21/2012 FINAL   Final   Organism ID, Bacteria STAPHYLOCOCCUS AUREUS   Final  SURGICAL PCR SCREEN     Status: Abnormal   Collection Time    12/20/12  2:46 PM      Result Value Range Status   MRSA, PCR NEGATIVE  NEGATIVE Final   Staphylococcus aureus POSITIVE (*) NEGATIVE Final   Comment:            The Xpert SA Assay (FDA     approved for NASAL specimens     in patients over 8 years of age),     is one component of     a comprehensive surveillance     program.  Test performance has     been validated by [facility] for patients greater     than or equal to 43 year old.     It is not intended     to diagnose infection  nor to     guide or monitor treatment.      Assessment: 57 YOM started on vancomycin and Zosyn for flexor tenosynovitis, s/p I&D x 2.  Patient's renal function has been stable.  See culture result below.  Vanc 11/18 >> Zosyn 11/18 >>  11/18 left finger wound cx - Staph aureus (pan sensitive except PCN) 11/20 SA PCR - positive   Goal of Therapy:  Vancomycin trough level 10-15 mcg/ml   Plan:  - Vanc 1gm IV Q8H - Zosyn 3.375gm IV Q8H, 4 hr infusion - F/U resume ASA when bleeding risk is no longer a concern - Recommend de-escalating abx to Ancef monotherapy, vanc trough tomorrow if abx to continue    Maikel Neisler D. Laney Potash, PharmD, BCPS Pager:  [phone] 12/21/2012, 12:20 PM

## 2012-12-21 NOTE — Progress Notes (Signed)
Subjective: 1 Day Post-Op Procedure(s) (LRB): LEFT HAND Incision and drainage  TO INCLUDE MIDDLE FINGER FLEXOR TENDON (Left) Patient reports pain as moderate.   He is tolerating a regular diet.  He notes no locking popping or catching. His drain is still working. I have reviewed this at length.  He notes no fever chills nausea vomiting. He denies neck back chest or nominal pain. I spent radial time discussing with him the upper extremity predicament and the issue of his infection. Objective: Vital signs in last 24 hours: Temp:  [98.1 F (36.7 C)-99.7 F (37.6 C)] 98.2 F (36.8 C) (11/21 1300) Pulse Rate:  [68-92] 78 (11/21 1300) Resp:  [12-22] 18 (11/21 1300) BP: (115-169)/(60-96) 136/77 mmHg (11/21 1300) SpO2:  [91 %-98 %] 98 % (11/21 1300) Weight:  [112.8 kg (248 lb 10.9 oz)] 112.8 kg (248 lb 10.9 oz) (11/20 1957)  Intake/Output from previous day: 11/20 0701 - 11/21 0700 In: 2257 [I.V.:1257; IV Piggyback:1000] Out: -  Intake/Output this shift: Total I/O In: 360 [P.O.:360] Out: 350 [Urine:350]   Recent Labs  12/19/12 0603 12/21/12 0719  HGB 14.2 13.1    Recent Labs  12/19/12 0603 12/21/12 0719  WBC 10.3 7.1  RBC 4.30 4.10*  HCT 41.1 39.5  PLT 224 219    Recent Labs  12/21/12 0719  NA 136  K 3.5  CL 97  CO2 28  BUN 13  CREATININE 1.09  GLUCOSE 108*  CALCIUM 9.0   No results found for this basename: LABPT, INR,  in the last 72 hours Physical exam HEENT is within normal limits. Chest is clear. Abdomen is soft nontender nondistended. There is no evidence of lower extremity trauma DVT or other abnormality. He is voiding well and tolerating a regular diet.  He has his dressing examined and changed with bedside I&D performed.  Procedure note: Patient underwent I&D (irrigation and debridement and (skin and subcutaneous this was an excisional debridement less than 20 cm at bedside and I did perform lavage of the wound and following this performed a dressing  change. He was dressed with gauze Curlex and a volar splint.  Overall he looks much better there is no complicating features.  I would recommend a formal I&D and possible closure tomorrow in the operating room.  He understands this and the nature of his infection.   Neurologically intact ABD soft Neurovascular intact Sensation intact distally Compartment soft  Assessment/Plan: 1 Day Post-Op Procedure(s) (LRB): LEFT HAND Incision and drainage  TO INCLUDE MIDDLE FINGER FLEXOR TENDON (Left) We will plan to return to the operating room tomorrow for an additional (third) I&D. I discussed him the risk and benefits and we'll proceed.  Unfortunately this is a severe infection. He is growing out staph aureus. We will change his antibiotics due to the sensitivities obtained. We appreciate the help and advice of pharmacy in regards to the culture results which I have reviewed this afternoon.  I feel that we can place him on Ancef 1 g IV Q8 hours at this point.  He understands all issues and plans and we'll proceed accordingly to the operative theater tomorrow. His blood work does look stable today. His white count is within normal limits.  Karen Chafe 12/21/2012, 4:41 PM

## 2012-12-21 NOTE — Op Note (Signed)
Antonio Cooper, Antonio Cooper                ACCOUNT NO.:  [id number]  MEDICAL RECORD NO.:  [id number]  LOCATION:  6N03C                        FACILITY:  [facility]  PHYSICIAN:  Antonio Ano. Luverne Zerkle, M.D.DATE OF BIRTH:  05-06-1955  DATE OF PROCEDURE: DATE OF DISCHARGE:                              OPERATIVE REPORT   PREOPERATIVE DIAGNOSIS:  Purulent flexor tenosynovitis, left middle finger.  Status post I and D, and tenosynovectomy.  POSTOPERATIVE DIAGNOSIS:  Purulent flexor tenosynovitis, left middle finger. Status post I and D, and tenosynovectomy.  PROCEDURE: 1. Irrigation debridement on deep abscess, left middle finger.  This     was an excisional debridement of skin, subcutaneous tissue, tendon,     and muscle tissue. 2. Extensive flexor digitorum profundus tenosynovectomy. 3. Extensive flexor digitorum superficialis tenosynovectomy.  SURGEON:  Dominica Severin, MD.  ASSISTANT:  None.  COMPLICATION:  None.  ANESTHESIA:  General.  DRAINS:  Two vessel loop drains and 1 pediatric feeding catheter drain was placed.  TOURNIQUET TIME:  0.  INDICATIONS:  This is a 57 year old male presents with the above- mentioned diagnosis.  I have counseled him with regards to the risks and benefits of surgery.  He still has a significant swelling and mild discharge from his wound.  He is taken back for second look.  OPERATIVE PROCEDURE:  He was taken to the procedure suite, underwent smooth induction of general anesthetic __________ prepped draped in usual sterile fashion about the left upper extremity with Betadine scrub and paint.  Following this, the arm was elevated, examined.  He still had purulence.  I opened the wounds, and noted some reaccumulation of fluid.  At this time, I performed an aggressive debridement of the wounds overlying the volar PIP and the MCP/distal palmar crease region. These were irrigated.  I did not take further cultures.  Following this, the patient then underwent a  very careful distal interphalangeal joint incision, and I then made 3 windows and passed vessel loop drains.  The patient had somewhat of a laceration to the FDP most distally, and I enlarged his incision and trimmed up the FDP laceration.  It was less than 60% of the tendon and that is why I did not stitch it, nor did I want to introduce any foreign material.  I did resect the A3 pulley.  Following this, I irrigated copiously.  Once this was done, I then placed a irrigation to the pediatric feeding catheter through all 3 windows.  Following this, I performed tenosynovectomy at the FDP and FDS tendons separately, and performed flexor traction checks, and there were no complicating features.  Greater than 1 L saline was placed into the wound.  The patient tolerated this well.  There were no complicating features.  All sponge, needle, and instrument counts were reported as correct. Patient tolerated the procedure well.  Drain was placed without difficulty in the wound.  As mentioned, wounds were left wide open.  He will need a third debridement in I and D, and possible closure if he is improving; however, at present time, I would not note that he has any major improvement or worsening.  I fear we still have to fight infection very aggressively.  We will continue IV antibiotics, and very careful close observatory care.  These notes have been discussed.  All questions have been encouraged and answered.  He was dressed with Adaptic Xeroform gauze, Kerlix, Webril, and a volar plaster splint.  He tolerated the procedure well.     Antonio Cooper, M.D.     [facility]  D:  12/20/2012  T:  12/21/2012  Job:  [id number]

## 2012-12-22 ENCOUNTER — Inpatient Hospital Stay (HOSPITAL_COMMUNITY): Payer: PRIVATE HEALTH INSURANCE | Admitting: Anesthesiology

## 2012-12-22 ENCOUNTER — Encounter (HOSPITAL_COMMUNITY): Payer: PRIVATE HEALTH INSURANCE | Admitting: Anesthesiology

## 2012-12-22 ENCOUNTER — Encounter (HOSPITAL_COMMUNITY)
Admission: EM | Disposition: A | Discharge status: Home / Self Care | Payer: Self-pay | Source: Home / Self Care | Attending: Orthopedic Surgery

## 2012-12-22 ENCOUNTER — Encounter (HOSPITAL_COMMUNITY): Payer: Self-pay | Admitting: Certified Registered"

## 2012-12-22 HISTORY — PX: I & D EXTREMITY: SHX5045

## 2012-12-22 SURGERY — IRRIGATION AND DEBRIDEMENT EXTREMITY
Anesthesia: General | Site: Hand | Laterality: Left | Wound class: Clean Contaminated

## 2012-12-22 MED ORDER — BUPIVACAINE HCL (PF) 0.25 % IJ SOLN
INTRAMUSCULAR | Status: AC
  Filled 2012-12-22: qty 30

## 2012-12-22 MED ORDER — MEPERIDINE HCL 25 MG/ML IJ SOLN: 6.2500 mg | INTRAMUSCULAR | Status: DC | PRN

## 2012-12-22 MED ORDER — SODIUM CHLORIDE 0.9 % IR SOLN
Status: DC | PRN
  Administered 2012-12-22: 3000 mL

## 2012-12-22 MED ORDER — LACTATED RINGERS IV SOLN
INTRAVENOUS | Status: DC | PRN
  Administered 2012-12-22: 12:00:00 via INTRAVENOUS

## 2012-12-22 MED ORDER — LIDOCAINE HCL (CARDIAC) 20 MG/ML IV SOLN
INTRAVENOUS | Status: DC | PRN
  Administered 2012-12-22: 100 mg via INTRAVENOUS

## 2012-12-22 MED ORDER — MIDAZOLAM HCL 5 MG/5ML IJ SOLN
INTRAMUSCULAR | Status: DC | PRN
  Administered 2012-12-22: 2 mg via INTRAVENOUS

## 2012-12-22 MED ORDER — ONDANSETRON HCL 4 MG/2ML IJ SOLN
INTRAMUSCULAR | Status: DC | PRN
  Administered 2012-12-22: 4 mg via INTRAVENOUS

## 2012-12-22 MED ORDER — PROPOFOL 10 MG/ML IV BOLUS
INTRAVENOUS | Status: DC | PRN
  Administered 2012-12-22: 150 mg via INTRAVENOUS

## 2012-12-22 MED ORDER — HYDROMORPHONE HCL PF 1 MG/ML IJ SOLN
INTRAMUSCULAR | Status: AC
  Administered 2012-12-22: 0.5 mg via INTRAVENOUS
  Filled 2012-12-22: qty 1

## 2012-12-22 MED ORDER — OXYCODONE HCL 5 MG/5ML PO SOLN: 5.0000 mg | Freq: Once | ORAL | Status: DC | PRN

## 2012-12-22 MED ORDER — FENTANYL CITRATE 0.05 MG/ML IJ SOLN
INTRAMUSCULAR | Status: DC | PRN
  Administered 2012-12-22: 100 ug via INTRAVENOUS
  Administered 2012-12-22: 50 ug via INTRAVENOUS

## 2012-12-22 MED ORDER — HYDROMORPHONE HCL PF 1 MG/ML IJ SOLN
0.2500 mg | INTRAMUSCULAR | Status: DC | PRN
  Administered 2012-12-22 (×4): 0.5 mg via INTRAVENOUS

## 2012-12-22 MED ORDER — ONDANSETRON HCL 4 MG/2ML IJ SOLN: 4.0000 mg | Freq: Once | INTRAMUSCULAR | Status: DC | PRN

## 2012-12-22 MED ORDER — SODIUM CHLORIDE 0.9 % IR SOLN
Status: DC | PRN
  Administered 2012-12-22: 1000 mL

## 2012-12-22 MED ORDER — OXYCODONE HCL 5 MG PO TABS: 5.0000 mg | ORAL_TABLET | Freq: Once | ORAL | Status: DC | PRN

## 2012-12-22 SURGICAL SUPPLY — 41 items
BANDAGE CONFORM 2  STR LF (GAUZE/BANDAGES/DRESSINGS) ×1 IMPLANT
BANDAGE ELASTIC 4 VELCRO ST LF (GAUZE/BANDAGES/DRESSINGS) ×1 IMPLANT
BANDAGE GAUZE ELAST BULKY 4 IN (GAUZE/BANDAGES/DRESSINGS) ×2 IMPLANT
CLOTH BEACON ORANGE TIMEOUT ST (SAFETY) ×1 IMPLANT
CORDS BIPOLAR (ELECTRODE) ×2 IMPLANT
CUFF TOURNIQUET SINGLE 18IN (TOURNIQUET CUFF) ×2 IMPLANT
CUFF TOURNIQUET SINGLE 24IN (TOURNIQUET CUFF) IMPLANT
DRSG ADAPTIC 3X8 NADH LF (GAUZE/BANDAGES/DRESSINGS) ×2 IMPLANT
ELECT REM PT RETURN 9FT ADLT (ELECTROSURGICAL)
ELECTRODE REM PT RTRN 9FT ADLT (ELECTROSURGICAL) IMPLANT
GAUZE XEROFORM 1X8 LF (GAUZE/BANDAGES/DRESSINGS) ×2 IMPLANT
GLOVE BIOGEL M STRL SZ7.5 (GLOVE) IMPLANT
GLOVE SS BIOGEL STRL SZ 8 (GLOVE) ×1 IMPLANT
GLOVE SUPERSENSE BIOGEL SZ 8 (GLOVE) ×1
GOWN PREVENTION PLUS XLARGE (GOWN DISPOSABLE) ×2 IMPLANT
GOWN STRL NON-REIN LRG LVL3 (GOWN DISPOSABLE) ×4 IMPLANT
GOWN STRL REIN XL XLG (GOWN DISPOSABLE) ×4 IMPLANT
HANDPIECE INTERPULSE COAX TIP (DISPOSABLE)
KIT BASIN OR (CUSTOM PROCEDURE TRAY) ×2 IMPLANT
KIT ROOM TURNOVER OR (KITS) ×2 IMPLANT
MANIFOLD NEPTUNE II (INSTRUMENTS) ×2 IMPLANT
NDL HYPO 25GX1X1/2 BEV (NEEDLE) IMPLANT
NEEDLE HYPO 25GX1X1/2 BEV (NEEDLE) IMPLANT
NS IRRIG 1000ML POUR BTL (IV SOLUTION) ×2 IMPLANT
PACK ORTHO EXTREMITY (CUSTOM PROCEDURE TRAY) ×2 IMPLANT
PAD ARMBOARD 7.5X6 YLW CONV (MISCELLANEOUS) ×4 IMPLANT
PAD CAST 4YDX4 CTTN HI CHSV (CAST SUPPLIES) ×1 IMPLANT
PADDING CAST COTTON 4X4 STRL (CAST SUPPLIES)
SET HNDPC FAN SPRY TIP SCT (DISPOSABLE) IMPLANT
SET IRRIG Y TYPE TUR BLADDER L (SET/KITS/TRAYS/PACK) ×1 IMPLANT
SPONGE GAUZE 4X4 12PLY (GAUZE/BANDAGES/DRESSINGS) ×2 IMPLANT
SPONGE LAP 18X18 X RAY DECT (DISPOSABLE) ×1 IMPLANT
SPONGE LAP 4X18 X RAY DECT (DISPOSABLE) ×1 IMPLANT
SYR CONTROL 10ML LL (SYRINGE) IMPLANT
TOWEL OR 17X24 6PK STRL BLUE (TOWEL DISPOSABLE) ×2 IMPLANT
TOWEL OR 17X26 10 PK STRL BLUE (TOWEL DISPOSABLE) ×2 IMPLANT
TUBE ANAEROBIC SPECIMEN COL (MISCELLANEOUS) IMPLANT
TUBE CONNECTING 12X1/4 (SUCTIONS) ×2 IMPLANT
TUBE FEEDING 5FR 15 INCH (TUBING) ×1 IMPLANT
WATER STERILE IRR 1000ML POUR (IV SOLUTION) ×2 IMPLANT
YANKAUER SUCT BULB TIP NO VENT (SUCTIONS) ×2 IMPLANT

## 2012-12-22 NOTE — Anesthesia Postprocedure Evaluation (Signed)
Anesthesia Post Note  Patient: Antonio Cooper  Procedure(s) Performed: Procedure(s) (LRB): IRRIGATION AND DEBRIDEMENT HAND (Left)  Anesthesia type: general  Patient location: PACU  Post pain: Pain level controlled  Post assessment: Patient's Cardiovascular Status Stable  Last Vitals:  Filed Vitals:   12/22/12 0540  BP: 112/77  Pulse: 73  Temp: 36.7 C  Resp: 16    Post vital signs: Reviewed and stable  Level of consciousness: sedated  Complications: No apparent anesthesia complications

## 2012-12-22 NOTE — Progress Notes (Signed)
Returned to 6n03 from pacu at this time. Family at bedside. Denies pain/nausea

## 2012-12-22 NOTE — H&P (Signed)
  Ready for repeat I&D All questions answered Correne Lalani MD

## 2012-12-22 NOTE — Anesthesia Procedure Notes (Signed)
Procedure Name: LMA Insertion Date/Time: 12/22/2012 12:53 PM Performed by: Arlice Colt B Pre-anesthesia Checklist: Patient identified, Emergency Drugs available, Suction available, Patient being monitored and Timeout performed Patient Re-evaluated:Patient Re-evaluated prior to inductionOxygen Delivery Method: Circle system utilized Preoxygenation: Pre-oxygenation with 100% oxygen Intubation Type: IV induction LMA: LMA inserted LMA Size: 5.0 Number of attempts: 1 Placement Confirmation: positive ETCO2 and breath sounds checked- equal and bilateral Tube secured with: Tape Dental Injury: Teeth and Oropharynx as per pre-operative assessment

## 2012-12-22 NOTE — Progress Notes (Signed)
Occupational Therapy Note  OT cancelled today due to pt in OR. Will follow up tomorrow.  Jeani Hawking, OTR/L [phone]

## 2012-12-22 NOTE — Anesthesia Preprocedure Evaluation (Addendum)
Anesthesia Evaluation  Patient identified by MRN, date of birth, ID band Patient awake    Reviewed: Allergy & Precautions, H&P , NPO status , Patient's Chart, lab work & pertinent test results, reviewed documented beta blocker date and time   History of Anesthesia Complications Negative for: history of anesthetic complications  Airway Mallampati: II TM Distance: >3 FB Neck ROM: Full    Dental  (+) Teeth Intact   Pulmonary former smoker,          Cardiovascular hypertension, Pt. on medications and Pt. on home beta blockers Rhythm:Regular Rate:Normal     Neuro/Psych PSYCHIATRIC DISORDERS Depression    GI/Hepatic GERD-  Medicated and Controlled,  Endo/Other    Renal/GU      Musculoskeletal   Abdominal   Peds  Hematology   Anesthesia Other Findings   Reproductive/Obstetrics                        Anesthesia Physical Anesthesia Plan  ASA: II  Anesthesia Plan: General   Post-op Pain Management:    Induction: Intravenous  Airway Management Planned: LMA  Additional Equipment:   Intra-op Plan:   Post-operative Plan: Extubation in OR  Informed Consent: I have reviewed the patients History and Physical, chart, labs and discussed the procedure including the risks, benefits and alternatives for the proposed anesthesia with the patient or authorized representative who has indicated his/her understanding and acceptance.   Dental advisory given  Plan Discussed with: CRNA and Surgeon  Anesthesia Plan Comments:        Anesthesia Quick Evaluation

## 2012-12-22 NOTE — Transfer of Care (Signed)
Immediate Anesthesia Transfer of Care Note  Patient: Antonio Cooper  Procedure(s) Performed: Procedure(s): IRRIGATION AND DEBRIDEMENT HAND (Left)  Patient Location: PACU  Anesthesia Type:General  Level of Consciousness: awake, alert  and oriented  Airway & Oxygen Therapy: Patient Spontanous Breathing  Post-op Assessment: Report given to PACU RN and Post -op Vital signs reviewed and stable  Post vital signs: Reviewed and stable  Complications: No apparent anesthesia complications

## 2012-12-22 NOTE — Op Note (Signed)
See dictation #[id number] Antonio Polivka Md

## 2012-12-23 MED ORDER — CEPHALEXIN 500 MG PO CAPS: 500.0000 mg | ORAL_CAPSULE | Freq: Four times a day (QID) | ORAL | Status: DC

## 2012-12-23 MED ORDER — OXYCODONE HCL 5 MG PO TABS: 5.0000 mg | ORAL_TABLET | ORAL | Status: DC | PRN

## 2012-12-23 MED ORDER — SULFAMETHOXAZOLE-TMP DS 800-160 MG PO TABS: 1.0000 | ORAL_TABLET | Freq: Two times a day (BID) | ORAL | Status: DC

## 2012-12-23 NOTE — Discharge Summary (Signed)
Physician Discharge Summary  Patient ID: Antonio Cooper MRN: [id number] DOB/AGE: May 11, 1955 57 y.o.  Admit date: 12/18/2012 Discharge date: 12/23/2012  Admission Diagnoses: Infectious left middle finger and hand tenosynovitis with FDP partial laceration Discharge Diagnoses: Same Active Problems:   * No active hospital problems. *   Discharged Condition: good  Hospital Course: Patient was admitted for infectious tenosynovitis of his finger left hand and middle finger. He underwent irrigation and debridement with a sheet irrigation system. He was seen Tuesday and immediately taken to the operative arena as time allowed. He subsequently underwent irrigation and debridement Thursday night and Saturday morning due to the severe infection. He ultimately has done well and has grown out staph aureus.   He is tolerated his antibiotic treatment and course of care relatively well.  The a patient has had a bowel movement he is tolerating a regular diet. He has no signs of DVT. He has no signs of complications during his hospital stay.  At the time of discharge he stable awake alert and oriented and looks to be doing well.   He demonstrates improvement in a very severe infection at this juncture. He also has demonstrated the ability to make nearly a full fist and has full extension at the time of discharge. He has no excessive swelling no your edema in the forearm or hand and looks to be set for transition to outpatient care  I discussed with patient the issues and the fact that he needs to be very cautious over the next week. I have reiterated to him that it is important to perform movement and elevation. I many time see him with his hand well below his heart looking at his computer screen and I have discussed with him that this is not the best course of action. The best course of action is elevation and range of motion. He seems to understand this when I explained to them.  I discussed him all  followup care plan and feel he is stable to DC now Consults: None  Significant Diagnostic Studies: labs: See  Treatments: surgery: See chart  Discharge Exam: Blood pressure 120/63, pulse 73, temperature 98.5 F (36.9 C), temperature source Oral, resp. rate 18, height 6' (1.829 m), weight 112.8 kg (248 lb 10.9 oz), SpO2 100.00%. General appearance: alert left hand looks much better there is noted improvement and no signs of worsening infection and no active discharge. Overall he looks quite well. I performed bedside dressing changes mentioned in his progress notes and feel he is ready for transition to outpatient care  .Marland KitchenThe patient is alert and oriented in no acute distress the patient complains of pain in the affected upper extremity.  The patient is noted to have a normal HEENT exam.  Lung fields show equal chest expansion and no shortness of breath  abdomen exam is nontender without distention.  Lower extremity examination does not show any fracture dislocation or blood clot symptoms.  Pelvis is stable neck and back are stable and nontender  Disposition: Final discharge disposition not confirmed     Medication List    ASK your doctor about these medications       aspirin 81 MG tablet  Take 81 mg by mouth daily.     buPROPion 300 MG 24 hr tablet  Commonly known as:  WELLBUTRIN XL  Take one tablet by mouth every day  as directed     clotrimazole-betamethasone cream  Commonly known as:  LOTRISONE  APPLY TO RASH TWICE DAILY  metoprolol 50 MG tablet  Commonly known as:  LOPRESSOR  Take 1 tablet (50 mg total) by mouth 2 (two) times daily.     MULTI VITAMIN DAILY PO  Take 1 tablet by mouth daily.     Omeprazole 20 MG Tbec  Take 1 tablet (20 mg total) by mouth daily.     rosuvastatin 10 MG tablet  Commonly known as:  CRESTOR  Take one tablet by mouth one time daily     sildenafil 100 MG tablet  Commonly known as:  VIAGRA  Take 1 tablet by mouth as needed for  erectile dysfunction     triamterene-hydrochlorothiazide 37.5-25 MG per tablet  Commonly known as:  MAXZIDE-25  Take one tablet by mouth one time daily           Follow-up Information   Follow up with Karen Chafe, MD. (Please call [phone] to see Mr. Wynona Neat Tuesday he for a wound check and Dr. Carlos Levering office)    Specialty:  Orthopedic Surgery   Contact information:   [address] [place] [address] [phone]       Signed: Karen Chafe 12/23/2012, 11:18 AM

## 2012-12-23 NOTE — Progress Notes (Addendum)
Occupational Therapy Treatment Patient Details Name: Antonio Cooper MRN: [id number] DOB: [birth year] Today's Date: 12/23/2012 Time: 4098-1191 OT Time Calculation (min): 13 min  OT Assessment / Plan / Recommendation  History of present illness HPI: The patient is a pleasant 57-year-old male presented to the emergency room today for evaluation of his left middle finger. He sustained a laceration this Sunday while removing a light bulb the bulb broke and the glass subsequently cut him. He sustained a proximal 1 cm laceration of the volar aspect of his PIP about the left middle finger. He states initially he had no significant pain but wound did bleed a great deal. Unfortunately the past few days he's had increasing pain, swelling, erythema and eyes at the extent he has had significant pain and notices significant redness and difficulty flexing or extending her finger., Pt is now s/p I and D.   OT comments  Had pt review exercise that Dr. Amanda Pea showed him to do. Educated to ice and elevate and move other four digits on hand and to follow what Dr. Amanda Pea told him to do.   Follow Up Recommendations  Other (comment) (recommend hand therapy after followup with Dr. Amanda Pea)    Barriers to Discharge       Equipment Recommendations  None recommended by OT    Recommendations for Other Services    Frequency Min 2X/week   Progress towards OT Goals Progress towards OT goals: Progressing toward goals  Plan Discharge plan remains appropriate    Precautions / Restrictions Precautions Precaution Comments: ice, elevation, edema control   Pertinent Vitals/Pain Pain 5/10. Applied ice and elevated.     ADL  ADL Comments: Had pt show therapist exercises that Dr. Amanda Pea showed him to do. Educated to move other four digits and also demonstrated educated on retrograde massage. Educated to ice and elevate and to avoid touching it as his brother states pt has been touching wound area and OT explained not to do  that to avoid infection. Practiced tying shoelace and pt able to do so-had educated on elastic shoelaces if he was unable. Educated not to put a lot of weight through that hand-pt lifts weights and OT recommended not doing this right now. Told him he could use it to help assist with LB dressing.   OT Diagnosis:    OT Problem List:   OT Treatment Interventions:     OT Goals(current goals can now be found in the care plan section) Acute Rehab OT Goals Patient Stated Goal: not stated OT Goal Formulation: With patient Time For Goal Achievement: 01/02/13 Potential to Achieve Goals: Good ADL Goals Pt Will Perform Grooming: with modified independence;standing Pt Will Perform Upper Body Bathing: with modified independence;standing Pt Will Perform Lower Body Bathing: with modified independence;sit to/from stand Additional ADL Goal #1: Pt will be independent with edema management techniques.   Visit Information  Last OT Received On: 12/23/12 Assistance Needed: +1 History of Present Illness: HPI: The patient is a pleasant 57-year-old male presented to the emergency room today for evaluation of his left middle finger. He sustained a laceration this Sunday while removing a light bulb the bulb broke and the glass subsequently cut him. He sustained a proximal 1 cm laceration of the volar aspect of his PIP about the left middle finger. He states initially he had no significant pain but wound did bleed a great deal. Unfortunately the past few days he's had increasing pain, swelling, erythema and eyes at the extent he has had significant  pain and notices significant redness and difficulty flexing or extending her finger., Pt is now s/p I and D.    Subjective Data      Prior Functioning       Cognition  Cognition Arousal/Alertness: Awake/alert Behavior During Therapy: WFL for tasks assessed/performed Overall Cognitive Status: Within Functional Limits for tasks assessed    Mobility  Bed Mobility Bed  Mobility: Not assessed Transfers Transfers: Not assessed    Exercises      Balance     End of Session OT - End of Session Activity Tolerance: Patient tolerated treatment well Patient left: in bed;with family/visitor present  GO     Earlie Raveling OTR/L [phone] 12/23/2012, 5:28 PM

## 2012-12-23 NOTE — Op Note (Signed)
Antonio Cooper, Antonio Cooper                ACCOUNT NO.:  [id number]  MEDICAL RECORD NO.:  [id number]  LOCATION:  6N03C                        FACILITY:  [facility]  PHYSICIAN:  Dionne Ano. Suhailah Kwan, M.D.DATE OF BIRTH:  [birth year]  DATE OF PROCEDURE: DATE OF DISCHARGE:                              OPERATIVE REPORT   PREOPERATIVE DIAGNOSIS:  Chronic purulent flexor tenosynovitis, presents for third washout and tenosynovectomy.  POSTOPERATIVE DIAGNOSIS:  Chronic purulent flexor tenosynovitis, presents for third washout and tenosynovectomy.  PROCEDURES: 1. I & D of skin, subcutaneous tissue, tendon, and muscle, left hand     and middle finger. 2. Extensive flexor tenolysis, tenosynovectomy about the flexor     digitorum profundus. 3. Left middle finger and palm. 4. Extensive tenolysis, tenosynovectomy, left flexor digitorum     superficialis palm and middle finger. 5. Delayed closure of the middle finger and palm.  SURGEON:  Dionne Ano. Amanda Pea, M.D.  ASSISTANT:  None.  COMPLICATION:  None.  ANESTHESIA:  General.  TOURNIQUET TIME:  0.  INDICATIONS:  Purulent flexor tenosynovitis in this 57 year old male with a challenging resolution characteristic.  He now presents for his third washout.  I have discussed with him risks and benefits, do's and don'ts, and all questions have been encouraged and answered.  At present time, he understands the nature of the procedure and plans.  DESCRIPTION OF PROCEDURE:  The patient was seen by myself and Anesthesia.  Underwent smooth induction of general anesthesia, laid supine, appropriately padded, and prepped and draped in usual sterile fashion.  Time-out was called.  Pre and postop check was complete, and following this, sterile field was secured.  We performed I & D of skin, subcutaneous tissue, muscle, through three separate incisions, one at the DIP crease, one in the PIP region of the left middle finger, and one in the palm.  This was an excisional  debridement of skin, subcutaneous tissue, muscle, and tendon.  I noted that the patient had significant improvement in his wound characteristics, placed 3 L of fluid through the wound.  Following this, we performed tenolysis, tenosynovectomy of the flexor digitorum profundus tendon.  Following this, we performed tenolysis, tenosynovectomy of the flexor digitorum superficialis tendons. Carefully, I removed a fibrinous exudate or other abnormalities about the tendons through the 3 separate incisions both the distal interphalangeal joint.  The PIP region volarly and the MP region were all addressed with tenolysis, tenosynovectomy.  Following this, I performed delayed closure with chromic and Prolene suture about the PIP, DIP, and MP regions.  Three separate incisions, he underwent delayed closure.  The patient tolerated this well.  There were no complicating features.  Once this done, the patient then had further irrigation followed by sterile dressing applied and taken to recovery room.  We will continue antibiotics, monitor him carefully and asked him to notify should any problems occur.  These notes have been discussed and all questions have been encouraged and answered.  Hopefully home tomorrow if he is looking well.     Dionne Ano. Amanda Pea, M.D.     [facility]  D:  12/22/2012  T:  12/23/2012  Job:  [id number]

## 2012-12-23 NOTE — Progress Notes (Signed)
Patient ID: Antonio Cooper, male   DOB: 1955-09-05, 57 y.o.   MRN: [id number] Patient looks much better. I performed a bedside wound care evaluation and dressing change.  He is ready for discharge in my opinion. We will see him Tuesday. Please see DC summary for full discharge instructions.  Discharge diagnosis:    Infectious flexor tenosynovitis left hand and middle finger with partial injury to the FDP tendon  Sawsan Riggio M.D.

## 2012-12-24 LAB — ANAEROBIC CULTURE

## 2012-12-25 ENCOUNTER — Encounter (HOSPITAL_COMMUNITY): Payer: Self-pay | Admitting: Orthopedic Surgery

## 2013-02-01 ENCOUNTER — Other Ambulatory Visit: Payer: Self-pay | Admitting: Family

## 2013-03-07 ENCOUNTER — Other Ambulatory Visit: Payer: Self-pay | Admitting: Family

## 2013-05-02 ENCOUNTER — Telehealth: Payer: Self-pay | Admitting: Family Medicine

## 2013-05-02 NOTE — Telephone Encounter (Signed)
Pt would like to switch to generic crestor 10mg  and generic bupropion call into [facility] [address]. Pt can not afford meds

## 2013-05-06 MED ORDER — SIMVASTATIN 10 MG PO TABS: 10.0000 mg | ORAL_TABLET | Freq: Every day | ORAL | Status: DC

## 2013-05-06 NOTE — Telephone Encounter (Signed)
Okay per Dr Sherren Mocha new Rx simvastatin and patient is aware

## 2013-05-15 ENCOUNTER — Other Ambulatory Visit: Payer: Self-pay | Admitting: Family Medicine

## 2013-05-16 ENCOUNTER — Other Ambulatory Visit: Payer: Self-pay | Admitting: Family Medicine

## 2013-08-14 ENCOUNTER — Other Ambulatory Visit: Payer: Self-pay | Admitting: Family Medicine

## 2013-09-16 ENCOUNTER — Other Ambulatory Visit (INDEPENDENT_AMBULATORY_CARE_PROVIDER_SITE_OTHER): Payer: PRIVATE HEALTH INSURANCE

## 2013-09-16 DIAGNOSIS — E785 Hyperlipidemia, unspecified: Secondary | ICD-10-CM

## 2013-09-16 DIAGNOSIS — Z0000 Encounter for general adult medical examination without abnormal findings: Secondary | ICD-10-CM

## 2013-09-16 LAB — CBC WITH DIFFERENTIAL/PLATELET
BASOS PCT: 0.5 % (ref 0.0–3.0)
Basophils Absolute: 0 10*3/uL (ref 0.0–0.1)
Eosinophils Absolute: 0.1 10*3/uL (ref 0.0–0.7)
Eosinophils Relative: 1.5 % (ref 0.0–5.0)
HEMATOCRIT: 45.4 % (ref 39.0–52.0)
HEMOGLOBIN: 15.6 g/dL (ref 13.0–17.0)
Lymphocytes Relative: 39.4 % (ref 12.0–46.0)
Lymphs Abs: 3.6 10*3/uL (ref 0.7–4.0)
MCHC: 34.3 g/dL (ref 30.0–36.0)
MCV: 95.9 fl (ref 78.0–100.0)
MONOS PCT: 7.8 % (ref 3.0–12.0)
Monocytes Absolute: 0.7 10*3/uL (ref 0.1–1.0)
NEUTROS ABS: 4.6 10*3/uL (ref 1.4–7.7)
Neutrophils Relative %: 50.8 % (ref 43.0–77.0)
Platelets: 230 10*3/uL (ref 150.0–400.0)
RBC: 4.74 Mil/uL (ref 4.22–5.81)
RDW: 13.4 % (ref 11.5–15.5)
WBC: 9.1 10*3/uL (ref 4.0–10.5)

## 2013-09-16 LAB — POCT URINALYSIS DIPSTICK
Bilirubin, UA: NEGATIVE
Glucose, UA: NEGATIVE
Ketones, UA: NEGATIVE
Leukocytes, UA: NEGATIVE
Nitrite, UA: NEGATIVE
PROTEIN UA: NEGATIVE
RBC UA: NEGATIVE
SPEC GRAV UA: 1.01
UROBILINOGEN UA: 0.2
pH, UA: 6.5

## 2013-09-16 LAB — BASIC METABOLIC PANEL
BUN: 15 mg/dL (ref 6–23)
CO2: 29 mEq/L (ref 19–32)
Calcium: 9.5 mg/dL (ref 8.4–10.5)
Chloride: 100 mEq/L (ref 96–112)
Creatinine, Ser: 1.1 mg/dL (ref 0.4–1.5)
GFR: 71.84 mL/min (ref >60.00)
Glucose, Bld: 94 mg/dL (ref 70–99)
Potassium: 3.9 mEq/L (ref 3.5–5.1)
SODIUM: 140 meq/L (ref 135–145)

## 2013-09-16 LAB — HEPATIC FUNCTION PANEL
ALBUMIN: 4 g/dL (ref 3.5–5.2)
ALT: 31 U/L (ref 0–53)
AST: 24 U/L (ref 0–37)
Alkaline Phosphatase: 77 U/L (ref 39–117)
Bilirubin, Direct: 0.1 mg/dL (ref 0.0–0.3)
TOTAL PROTEIN: 7 g/dL (ref 6.0–8.3)
Total Bilirubin: 1 mg/dL (ref 0.2–1.2)

## 2013-09-16 LAB — LIPID PANEL
CHOL/HDL RATIO: 5
Cholesterol: 223 mg/dL — ABNORMAL HIGH (ref 0–200)
HDL: 41.4 mg/dL (ref >39.00)
NONHDL: 181.6
Triglycerides: 228 mg/dL — ABNORMAL HIGH (ref 0.0–149.0)
VLDL: 45.6 mg/dL — ABNORMAL HIGH (ref 0.0–40.0)

## 2013-09-16 LAB — PSA: PSA: 1.1 ng/mL (ref 0.10–4.00)

## 2013-09-16 LAB — TSH: TSH: 1.46 u[IU]/mL (ref 0.35–4.50)

## 2013-09-16 LAB — LDL CHOLESTEROL, DIRECT: Direct LDL: 151 mg/dL

## 2013-09-23 ENCOUNTER — Encounter: Payer: PRIVATE HEALTH INSURANCE | Admitting: Family Medicine

## 2013-09-27 ENCOUNTER — Encounter: Payer: Self-pay | Admitting: Family Medicine

## 2013-09-27 ENCOUNTER — Ambulatory Visit (INDEPENDENT_AMBULATORY_CARE_PROVIDER_SITE_OTHER): Payer: PRIVATE HEALTH INSURANCE | Admitting: Family Medicine

## 2013-09-27 VITALS — BP 140/88 | Temp 99.2°F | Ht 71.25 in | Wt 248.0 lb

## 2013-09-27 DIAGNOSIS — E785 Hyperlipidemia, unspecified: Secondary | ICD-10-CM

## 2013-09-27 DIAGNOSIS — I10 Essential (primary) hypertension: Secondary | ICD-10-CM

## 2013-09-27 DIAGNOSIS — S335XXD Sprain of ligaments of lumbar spine, subsequent encounter: Secondary | ICD-10-CM

## 2013-09-27 DIAGNOSIS — F329 Major depressive disorder, single episode, unspecified: Secondary | ICD-10-CM

## 2013-09-27 DIAGNOSIS — F411 Generalized anxiety disorder: Secondary | ICD-10-CM

## 2013-09-27 DIAGNOSIS — F528 Other sexual dysfunction not due to a substance or known physiological condition: Secondary | ICD-10-CM

## 2013-09-27 DIAGNOSIS — Z0000 Encounter for general adult medical examination without abnormal findings: Secondary | ICD-10-CM

## 2013-09-27 DIAGNOSIS — Z23 Encounter for immunization: Secondary | ICD-10-CM

## 2013-09-27 DIAGNOSIS — K2100 Gastro-esophageal reflux disease with esophagitis, without bleeding: Secondary | ICD-10-CM

## 2013-09-27 DIAGNOSIS — F3289 Other specified depressive episodes: Secondary | ICD-10-CM

## 2013-09-27 MED ORDER — OMEPRAZOLE 20 MG PO CPDR: DELAYED_RELEASE_CAPSULE | ORAL | Status: DC

## 2013-09-27 MED ORDER — METOPROLOL TARTRATE 50 MG PO TABS: 50.0000 mg | ORAL_TABLET | Freq: Two times a day (BID) | ORAL | Status: DC

## 2013-09-27 MED ORDER — SIMVASTATIN 10 MG PO TABS: 10.0000 mg | ORAL_TABLET | Freq: Every day | ORAL | Status: DC

## 2013-09-27 MED ORDER — BUPROPION HCL ER (XL) 300 MG PO TB24: ORAL_TABLET | ORAL | Status: DC

## 2013-09-27 MED ORDER — SILDENAFIL CITRATE 20 MG PO TABS: 20.0000 mg | ORAL_TABLET | Freq: Three times a day (TID) | ORAL | Status: DC

## 2013-09-27 MED ORDER — CLOTRIMAZOLE-BETAMETHASONE 1-0.05 % EX CREA: TOPICAL_CREAM | CUTANEOUS | Status: DC

## 2013-09-27 MED ORDER — TRIAMTERENE-HCTZ 37.5-25 MG PO TABS: ORAL_TABLET | ORAL | Status: DC

## 2013-09-27 NOTE — Patient Instructions (Signed)
Continue your current medication  We will get you set up a consult for evaluation of your back pain

## 2013-09-27 NOTE — Progress Notes (Signed)
   Subjective:    Patient ID: Antonio Cooper, male    DOB: Apr 20, 1955, 58 y.o.   MRN: [id number]  Antonio Cooper is a 58 year old male nonsmoker who comes in today for evaluation of depression, hypertension, reflux esophagitis, hyperlipidemia, chronic back pain  He has been a patient of Dr. Joni Fears in the past. Dr. Joni Fears had given him oxycodone 5 mg 1-2 tabs 3 times daily for back pain. He has never had a evaluation. I recommend he see Dr. Ellene Route for consult  His blood pressure at home is 130/85 on Lopressor 50 mg twice a day, Maxzide 37.5-25 daily,  He takes Prilosec daily because of reflux was esophagitis  He takes Wellbutrin 300 mg daily because of a history of mild depression and panic attacks  He uses Viagra 100 mg when necessary for ED and Zocor 10 mg for hyperlipidemia.  He gets routine eye care, dental care, colonoscopies showed some polyps he is plugged into GI GI wise asymptomatic no bleeding. Vaccinations updated by Apolonio Schneiders  He states he had an infection in his finger that required surgical treatment in 6 days of hospitalization this past winter by Dr. Lonny Prude   Review of Systems  Constitutional: Negative.   HENT: Negative.   Eyes: Negative.   Respiratory: Negative.   Cardiovascular: Negative.   Gastrointestinal: Negative.   Genitourinary: Negative.   Musculoskeletal: Negative.   Skin: Negative.   Neurological: Negative.   Psychiatric/Behavioral: Negative.        Objective:   Physical Exam  Nursing note and vitals reviewed. Constitutional: He is oriented to person, place, and time. He appears well-developed and well-nourished.  HENT:  Head: Normocephalic and atraumatic.  Right Ear: External ear normal.  Left Ear: External ear normal.  Nose: Nose normal.  Mouth/Throat: Oropharynx is clear and moist.  Eyes: Conjunctivae and EOM are normal. Pupils are equal, round, and reactive to light.  Neck: Normal range of motion. Neck supple. No JVD present. No tracheal deviation  present. No thyromegaly present.  Cardiovascular: Normal rate, regular rhythm, normal heart sounds and intact distal pulses.  Exam reveals no gallop and no friction rub.   No murmur heard. No carotid neurologic bruits peripheral pulses 2+ and symmetrical  Pulmonary/Chest: Effort normal and breath sounds normal. No stridor. No respiratory distress. He has no wheezes. He has no rales. He exhibits no tenderness.  Abdominal: Soft. Bowel sounds are normal. He exhibits no distension and no mass. There is no tenderness. There is no rebound and no guarding.  Genitourinary: Rectum normal, prostate normal and penis normal. Guaiac negative stool. No penile tenderness.  Musculoskeletal: Normal range of motion. He exhibits no edema and no tenderness.  Lymphadenopathy:    He has no cervical adenopathy.  Neurological: He is alert and oriented to person, place, and time. He has normal reflexes. No cranial nerve deficit. He exhibits normal muscle tone.  Skin: Skin is warm and dry. No rash noted. No erythema. No pallor.  Total body skin exam normal  Psychiatric: He has a normal mood and affect. His behavior is normal. Judgment and thought content normal.          Assessment & Plan:  Healthy male  History of mild depression continue Wellbutrin  History of hypertension continue Lopressor and Maxide  History of hyperlipidemia continue Zocor  Erectile dysfunction switch to generic Viagra  Reflux esophagitis continue Prilosec  History of back pain treated symptomatically........ no diagnostic workup has been done.... referred to Dr. Ellene Route for evaluation

## 2013-09-27 NOTE — Progress Notes (Signed)
Pre visit review using our clinic review tool, if applicable. No additional management support is needed unless otherwise documented below in the visit note. 

## 2013-10-05 ENCOUNTER — Encounter: Payer: Self-pay | Admitting: Family Medicine

## 2013-10-05 DIAGNOSIS — M549 Dorsalgia, unspecified: Secondary | ICD-10-CM

## 2013-10-08 NOTE — Telephone Encounter (Signed)
Referral placed.

## 2014-01-09 ENCOUNTER — Telehealth: Payer: Self-pay | Admitting: Family Medicine

## 2014-01-09 NOTE — Telephone Encounter (Signed)
Pt called to ask if Dr Sherren Mocha would rx the following ALPRAZolam Duanne Moron) 0.5 MG tablet   Pt said Dr Joni Fears rx this for him   Pharmacy [facility] [address]

## 2014-01-09 NOTE — Telephone Encounter (Signed)
Rx denied and pharmacy notified.

## 2014-01-14 ENCOUNTER — Encounter: Payer: Self-pay | Admitting: Family Medicine

## 2014-01-14 DIAGNOSIS — M549 Dorsalgia, unspecified: Secondary | ICD-10-CM

## 2014-03-14 ENCOUNTER — Telehealth: Payer: Self-pay | Admitting: Family Medicine

## 2014-03-14 DIAGNOSIS — Z86010 Personal history of colonic polyps: Secondary | ICD-10-CM

## 2014-03-14 NOTE — Telephone Encounter (Signed)
Referral placed.

## 2014-03-14 NOTE — Telephone Encounter (Signed)
Pt needs referral for his 5 yr fup gi colonoscopy.  Pt saw Dr Marily Memos for the first 2 times, but he is not in network for pt any longer. pt will go to [facility] Pt states every time he has had colonoscopy, they have found polyps, so he must go every 5 yrs,

## 2014-03-19 ENCOUNTER — Telehealth: Payer: Self-pay | Admitting: Gastroenterology

## 2014-03-19 NOTE — Telephone Encounter (Signed)
Patient was referred by PCP for another colon. Last one was with Dr. Earlean Shawl in 2011. Report in Epic to be reviewed.

## 2014-03-19 NOTE — Telephone Encounter (Signed)
Left message on machine to call back  

## 2014-03-19 NOTE — Telephone Encounter (Signed)
The pt has history with Dr Earlean Shawl, but his insurance is out of network now and would like to be seen here.  Records to be faxed appt made for 05/12/14

## 2014-03-25 ENCOUNTER — Other Ambulatory Visit: Payer: Self-pay | Admitting: Neurological Surgery

## 2014-03-25 DIAGNOSIS — R200 Anesthesia of skin: Secondary | ICD-10-CM

## 2014-04-12 ENCOUNTER — Ambulatory Visit
Admission: RE | Admit: 2014-04-12 | Discharge: 2014-04-12 | Disposition: A | Discharge status: Home / Self Care | Payer: PRIVATE HEALTH INSURANCE | Source: Ambulatory Visit | Attending: Neurological Surgery | Admitting: Neurological Surgery

## 2014-04-12 DIAGNOSIS — R200 Anesthesia of skin: Secondary | ICD-10-CM

## 2014-05-12 ENCOUNTER — Ambulatory Visit (INDEPENDENT_AMBULATORY_CARE_PROVIDER_SITE_OTHER): Payer: 59 | Admitting: Gastroenterology

## 2014-05-12 ENCOUNTER — Encounter: Payer: Self-pay | Admitting: Gastroenterology

## 2014-05-12 VITALS — BP 130/60 | HR 66 | Ht 71.5 in | Wt 247.4 lb

## 2014-05-12 DIAGNOSIS — Z86010 Personal history of colonic polyps: Secondary | ICD-10-CM

## 2014-05-12 NOTE — Progress Notes (Signed)
HPI: This is a very pleasant 59 year old man who was referred to me by Dorena Cookey, MD  to evaluate  previous history of colon polyps .    Has had 2 colonoscopies.  These were both done with Dr. Earlean Shawl Colonoscopy February 2011 done for "surveillance examination at five-year interval". He found one 6 mm polyp removed with cold biopsy. This was proven to be a hyperplastic polyp on pathology. He had a colonoscopy November 2005 done for routine screening. Dr. Earlean Shawl documented a diminutive 5 mm polyp as well as a 9 mm polyp. These were both in the "left colon and they were adjacent to each other. One was removed with snare cautery and the other was removed with hot biopsy forceps. These were both proved to be hyperplastic polyps as well. He was told after each examination that he would require five-year surveillance examination.   No colon cancer in family.  No significant GI issues.  Was recently diagnosed with bulging discs in lower back.     Review of systems: Pertinent positive and negative review of systems were noted in the above HPI section. Complete review of systems was performed and was otherwise normal.    Past Medical History  Diagnosis Date  . Depression   . GERD (gastroesophageal reflux disease)   . Hyperlipidemia   . Hypertension   . Bulging lumbar disc     Past Surgical History  Procedure Laterality Date  . Broken nose   1974  . Tympanic membrane repair    . I&d extremity Left 12/18/2012    Procedure: I&D Left Middle Finger With Repair Recons.Prn;  Surgeon: Roseanne Kaufman, MD;  Location: [facility];  Service: Orthopedics;  Laterality: Left;  . I&d extremity Left 12/20/2012    Procedure: LEFT HAND Incision and drainage  TO INCLUDE MIDDLE FINGER FLEXOR TENDON;  Surgeon: Roseanne Kaufman, MD;  Location: [facility];  Service: Orthopedics;  Laterality: Left;  . I&d extremity Left 12/22/2012    Procedure: IRRIGATION AND DEBRIDEMENT HAND;  Surgeon: Roseanne Kaufman, MD;  Location: [facility];  Service: Orthopedics;  Laterality: Left;    Current Outpatient Prescriptions  Medication Sig Dispense Refill  . aspirin 81 MG tablet Take 81 mg by mouth daily.      Marland Kitchen buPROPion (WELLBUTRIN XL) 300 MG 24 hr tablet TAKE ONE TABLET BY MOUTH ONE TIME DAILY 90 tablet 3  . metoprolol (LOPRESSOR) 50 MG tablet Take 1 tablet (50 mg total) by mouth 2 (two) times daily. (Patient taking differently: Take 50 mg by mouth daily. ) 200 tablet 3  . Multiple Vitamin (MULTI VITAMIN DAILY PO) Take 1 tablet by mouth daily.    Marland Kitchen omeprazole (PRILOSEC) 20 MG capsule TAKE ONE CAPSULE BY MOUTH ONE TIME DAILY 90 capsule 3  . oxyCODONE (OXY IR/ROXICODONE) 5 MG immediate release tablet Take 1-2 tablets (5-10 mg total) by mouth every 3 (three) hours as needed for moderate pain. 50 tablet 0  . simvastatin (ZOCOR) 10 MG tablet Take 1 tablet (10 mg total) by mouth at bedtime. 90 tablet 3  . triamterene-hydrochlorothiazide (MAXZIDE-25) 37.5-25 MG per tablet TAKE ONE TABLET BY MOUTH ONE TIME DAILY 100 tablet 3   No current facility-administered medications for this visit.    Allergies as of 05/12/2014  . (No Known Allergies)    Family History  Problem Relation Age of Onset  . Hypertension Father   . Colon cancer Neg Hx     History   Social History  . Marital Status: Single  Spouse Name: N/A  . Number of Children: 2  . Years of Education: N/A   Occupational History  . CFO    Social History Main Topics  . Smoking status: Former Research scientist (life sciences)  . Smokeless tobacco: Never Used  . Alcohol Use: 0.0 oz/week    0 Standard drinks or equivalent per week     Comment: social  . Drug Use: No  . Sexual Activity: Yes   Other Topics Concern  . Not on file   Social History Narrative       Physical Exam: BP 130/60 mmHg  Pulse 66  Ht 5' 11.5" (1.816 m)  Wt 247 lb 6.4 oz (112.22 kg)  BMI 34.03 kg/m2 Constitutional: generally well-appearing Psychiatric: alert and oriented x3 Eyes: extraocular movements  intact Mouth: oral pharynx moist, no lesions Neck: supple no lymphadenopathy Cardiovascular: heart regular rate and rhythm Lungs: clear to auscultation bilaterally Abdomen: soft, nontender, nondistended, no obvious ascites, no peritoneal signs, normal bowel sounds Extremities: no lower extremity edema bilaterally Skin: no lesions on visible extremities    Assessment and plan: 59 y.o. male with  previous history of colon polyps  We will able to obtain the records from his previous gastroenterologist and it is clear that none of the polyps that have never been removed were precancerous polyps. These were all hyperplastic polyps and none of them were even very large. Current national excepted colorectal cancer screening in colon polyp surveillance guidelines show that he is safe to have recall colonoscopy at routine risk interval which would be 10 years from the date of his last one. I will put him in recall system for 2021 therefore. He and I had a very good open discussion about screening, polyp surveillance, national guidelines and he does understand that he has any concerning new GI symptoms colonoscopy testing can be done at any time. He will call if he has a concern such as that.   Cc: Dorena Cookey, MD

## 2014-05-12 NOTE — Patient Instructions (Signed)
Recall colonoscopy 03/2019 for routine risk colon cancer screening with colonoscopy. Call sooner if you have any concerning GI issues.

## 2014-05-29 ENCOUNTER — Telehealth: Payer: Self-pay | Admitting: Family Medicine

## 2014-05-29 NOTE — Telephone Encounter (Signed)
Patient would like to start seeing a provider here in [facility] due to location. Is this ok? Thanks!

## 2014-10-03 ENCOUNTER — Other Ambulatory Visit: Payer: Self-pay | Admitting: Family Medicine

## 2014-10-10 ENCOUNTER — Telehealth: Payer: Self-pay

## 2014-10-10 NOTE — Telephone Encounter (Signed)
Pre visit call completed 

## 2014-10-13 ENCOUNTER — Encounter: Payer: Self-pay | Admitting: Physician Assistant

## 2014-10-13 ENCOUNTER — Ambulatory Visit (INDEPENDENT_AMBULATORY_CARE_PROVIDER_SITE_OTHER): Payer: 59 | Admitting: Physician Assistant

## 2014-10-13 VITALS — BP 138/76 | HR 68 | Temp 98.4°F | Resp 16 | Ht 70.5 in | Wt 237.2 lb

## 2014-10-13 DIAGNOSIS — K2100 Gastro-esophageal reflux disease with esophagitis, without bleeding: Secondary | ICD-10-CM

## 2014-10-13 DIAGNOSIS — Z0000 Encounter for general adult medical examination without abnormal findings: Secondary | ICD-10-CM | POA: Insufficient documentation

## 2014-10-13 DIAGNOSIS — I10 Essential (primary) hypertension: Secondary | ICD-10-CM | POA: Diagnosis not present

## 2014-10-13 DIAGNOSIS — E785 Hyperlipidemia, unspecified: Secondary | ICD-10-CM | POA: Diagnosis not present

## 2014-10-13 DIAGNOSIS — F329 Major depressive disorder, single episode, unspecified: Secondary | ICD-10-CM

## 2014-10-13 DIAGNOSIS — Z23 Encounter for immunization: Secondary | ICD-10-CM | POA: Diagnosis not present

## 2014-10-13 DIAGNOSIS — F418 Other specified anxiety disorders: Secondary | ICD-10-CM

## 2014-10-13 DIAGNOSIS — F419 Anxiety disorder, unspecified: Secondary | ICD-10-CM

## 2014-10-13 DIAGNOSIS — F32A Depression, unspecified: Secondary | ICD-10-CM

## 2014-10-13 LAB — CBC WITH DIFFERENTIAL/PLATELET
BASOS PCT: 0.6 % (ref 0.0–3.0)
Basophils Absolute: 0 10*3/uL (ref 0.0–0.1)
EOS ABS: 0.1 10*3/uL (ref 0.0–0.7)
Eosinophils Relative: 1.6 % (ref 0.0–5.0)
HCT: 45.1 % (ref 39.0–52.0)
Hemoglobin: 15.5 g/dL (ref 13.0–17.0)
LYMPHS PCT: 29.5 % (ref 12.0–46.0)
Lymphs Abs: 2.5 10*3/uL (ref 0.7–4.0)
MCHC: 34.4 g/dL (ref 30.0–36.0)
MCV: 95.4 fl (ref 78.0–100.0)
MONOS PCT: 6.7 % (ref 3.0–12.0)
Monocytes Absolute: 0.6 10*3/uL (ref 0.1–1.0)
NEUTROS ABS: 5.2 10*3/uL (ref 1.4–7.7)
Neutrophils Relative %: 61.6 % (ref 43.0–77.0)
Platelets: 240 10*3/uL (ref 150.0–400.0)
RBC: 4.72 Mil/uL (ref 4.22–5.81)
RDW: 13.3 % (ref 11.5–15.5)
WBC: 8.4 10*3/uL (ref 4.0–10.5)

## 2014-10-13 LAB — URINALYSIS, ROUTINE W REFLEX MICROSCOPIC
BILIRUBIN URINE: NEGATIVE
Hgb urine dipstick: NEGATIVE
KETONES UR: NEGATIVE
LEUKOCYTES UA: NEGATIVE
Nitrite: NEGATIVE
RBC / HPF: NONE SEEN (ref 0–?)
Specific Gravity, Urine: 1.02 (ref 1.000–1.030)
Total Protein, Urine: NEGATIVE
URINE GLUCOSE: NEGATIVE
UROBILINOGEN UA: 0.2 (ref 0.0–1.0)
WBC, UA: NONE SEEN (ref 0–?)
pH: 6 (ref 5.0–8.0)

## 2014-10-13 LAB — BASIC METABOLIC PANEL
BUN: 16 mg/dL (ref 6–23)
CO2: 30 mEq/L (ref 19–32)
Calcium: 9.9 mg/dL (ref 8.4–10.5)
Chloride: 100 mEq/L (ref 96–112)
Creatinine, Ser: 1.04 mg/dL (ref 0.40–1.50)
GFR: 77.17 mL/min (ref >60.00)
Glucose, Bld: 128 mg/dL — ABNORMAL HIGH (ref 70–99)
Potassium: 4 mEq/L (ref 3.5–5.1)
Sodium: 140 mEq/L (ref 135–145)

## 2014-10-13 LAB — HEPATIC FUNCTION PANEL
ALT: 33 U/L (ref 0–53)
AST: 22 U/L (ref 0–37)
Albumin: 4.5 g/dL (ref 3.5–5.2)
Alkaline Phosphatase: 83 U/L (ref 39–117)
BILIRUBIN TOTAL: 0.8 mg/dL (ref 0.2–1.2)
Bilirubin, Direct: 0.2 mg/dL (ref 0.0–0.3)
Total Protein: 7.3 g/dL (ref 6.0–8.3)

## 2014-10-13 LAB — LIPID PANEL
CHOL/HDL RATIO: 5
Cholesterol: 210 mg/dL — ABNORMAL HIGH (ref 0–200)
HDL: 45.4 mg/dL (ref >39.00)
NonHDL: 165.03
Triglycerides: 291 mg/dL — ABNORMAL HIGH (ref 0.0–149.0)
VLDL: 58.2 mg/dL — ABNORMAL HIGH (ref 0.0–40.0)

## 2014-10-13 LAB — TSH: TSH: 2.5 u[IU]/mL (ref 0.35–4.50)

## 2014-10-13 LAB — LDL CHOLESTEROL, DIRECT: LDL DIRECT: 116 mg/dL

## 2014-10-13 LAB — PSA: PSA: 0.77 ng/mL (ref 0.10–4.00)

## 2014-10-13 NOTE — Progress Notes (Signed)
Patient presents to clinic today to transfer care from Dr. Sherren Mocha. Is requesting CPE. Is fasting for labs.  Patient with hx of depression well-controlled currently with Wellbutrin XL 300 mg daily. Denies breakthrough symptoms. Patient with history of GERD, well controlled with Prilosec 20 mg daily. Denies breakthrough symptoms of medication. Is trying to take drug holidays without success. Patient currently on simvastatin 10 mg daily for hyperlipidemia. Denies myalgias. Has history of hypertension, controlled with Lopressor 50 mg twice daily. Patient denies chest pain, palpitations, lightheadedness, dizziness, vision changes or frequent headaches.  Past Medical History  Diagnosis Date  . Depression   . GERD (gastroesophageal reflux disease)   . Hyperlipidemia   . Hypertension   . Bulging lumbar disc   . Spinal stenosis   . Colon polyps     Current Outpatient Prescriptions on File Prior to Visit  Medication Sig Dispense Refill  . aspirin 81 MG tablet Take 81 mg by mouth daily.      Marland Kitchen buPROPion (WELLBUTRIN XL) 300 MG 24 hr tablet TAKE ONE TABLET BY MOUTH ONE TIME DAILY 90 tablet 2  . metoprolol (LOPRESSOR) 50 MG tablet TAKE ONE TABLET BY MOUTH TWICE DAILY 200 tablet 2  . Multiple Vitamin (MULTI VITAMIN DAILY PO) Take 1 tablet by mouth daily.    Marland Kitchen omeprazole (PRILOSEC) 20 MG capsule TAKE ONE CAPSULE BY MOUTH ONE TIME DAILY 90 capsule 2  . oxyCODONE (OXY IR/ROXICODONE) 5 MG immediate release tablet Take 1-2 tablets (5-10 mg total) by mouth every 3 (three) hours as needed for moderate pain. 50 tablet 0  . simvastatin (ZOCOR) 10 MG tablet TAKE 1 TABLET BY MOUTH AT BEDTIME. 90 tablet 2  . triamterene-hydrochlorothiazide (MAXZIDE-25) 37.5-25 MG per tablet TAKE ONE TABLET BY MOUTH ONE TIME DAILY 100 tablet 2   No current facility-administered medications on file prior to visit.    No Known Allergies  Family History  Problem Relation Age of Onset  . Hypertension Father 61    Deceased  .  Kidney cancer Mother 5    Deceased  . Kidney disease Mother   . Neuropathy Father   . Kidney cancer Maternal Grandmother   . Cancer Other     Aunts & Uncles  . Diabetes Brother   . Breast cancer Sister   . Healthy Brother     x1  . Healthy Daughter     x2    Social History   Social History  . Marital Status: Single    Spouse Name: N/A  . Number of Children: 2  . Years of Education: N/A   Occupational History  . CFO    Social History Main Topics  . Smoking status: Former Research scientist (life sciences)  . Smokeless tobacco: Never Used  . Alcohol Use: 0.0 oz/week    0 Standard drinks or equivalent per week     Comment: social  . Drug Use: No  . Sexual Activity: Yes   Other Topics Concern  . None   Social History Narrative   Review of Systems  Constitutional: Negative for fever and weight loss.  HENT: Negative for ear discharge, ear pain, hearing loss and tinnitus.   Eyes: Negative for blurred vision, double vision, photophobia and pain.  Respiratory: Negative for cough and shortness of breath.   Cardiovascular: Negative for chest pain and palpitations.  Gastrointestinal: Negative for heartburn, nausea, vomiting, abdominal pain, diarrhea, constipation, blood in stool and melena.  Genitourinary: Negative for dysuria, urgency, frequency, hematuria and flank pain.  Musculoskeletal: Negative  for falls.  Neurological: Negative for dizziness, loss of consciousness and headaches.  Endo/Heme/Allergies: Negative for environmental allergies.  Psychiatric/Behavioral: Negative for depression, suicidal ideas, hallucinations and substance abuse. The patient is not nervous/anxious and does not have insomnia.    BP 138/76 mmHg  Pulse 68  Temp(Src) 98.4 F (36.9 C) (Oral)  Resp 16  Ht 5' 10.5" (1.791 m)  Wt 237 lb 4 oz (107.616 kg)  BMI 33.55 kg/m2  SpO2 98%  Physical Exam  Constitutional: He is oriented to person, place, and time and well-developed, well-nourished, and in no distress.  HENT:    Head: Normocephalic and atraumatic.  Right Ear: External ear normal.  Left Ear: External ear normal.  Nose: Nose normal.  Mouth/Throat: Oropharynx is clear and moist. No oropharyngeal exudate.  Eyes: Conjunctivae and EOM are normal. Pupils are equal, round, and reactive to light.  Neck: Neck supple. No thyromegaly present.  Cardiovascular: Normal rate, regular rhythm, normal heart sounds and intact distal pulses.   Pulmonary/Chest: Effort normal and breath sounds normal. No respiratory distress. He has no wheezes. He has no rales. He exhibits no tenderness.  Abdominal: Soft. Bowel sounds are normal. He exhibits no distension and no mass. There is no tenderness. There is no rebound and no guarding.  Genitourinary: Testes/scrotum normal.  Deferred.  Lymphadenopathy:    He has no cervical adenopathy.  Neurological: He is alert and oriented to person, place, and time.  Skin: Skin is warm and dry. No rash noted.  Psychiatric: Affect normal.  Vitals reviewed.  No results found for this or any previous visit (from the past 2160 hour(s)).  Assessment/Plan: Visit for preventive health examination Depression screen negative. Health Maintenance reviewed -- HIV and Hep C screen will be obtained today. Colonoscopy up-to-date. IFOB given. Flu shot given. Patient to return for shingles vaccination. Preventive schedule discussed and handout given in AVS. Will obtain fasting labs today.   Hyperlipidemia Will obtain fasting labs today  GERD Well-controlled. Continue current regimen. Weight loss encouraged. GERD diet reviewed.  Essential hypertension Well-controlled. Asymptomatic. Continue current regimen.  Anxiety and depression Well-controlled. Continue current regimen.

## 2014-10-13 NOTE — Assessment & Plan Note (Signed)
Will obtain fasting labs today.

## 2014-10-13 NOTE — Assessment & Plan Note (Signed)
Well controlled. Asymptomatic °Continue current regimen °

## 2014-10-13 NOTE — Patient Instructions (Signed)
Please go to the lab for blood work.  I will call you with your results. If your blood work is normal we will follow-up yearly for physicals.   Please mail back the IFOB kit. If you show antibodies to Chicken Pox, we will give you the shingles vaccine.  Preventive Care for Adults A healthy lifestyle and preventive care can promote health and wellness. Preventive health guidelines for men include the following key practices:  A routine yearly physical is a good way to check with your health care provider about your health and preventative screening. It is a chance to share any concerns and updates on your health and to receive a thorough exam.  Visit your dentist for a routine exam and preventative care every 6 months. Brush your teeth twice a day and floss once a day. Good oral hygiene prevents tooth decay and gum disease.  The frequency of eye exams is based on your age, health, family medical history, use of contact lenses, and other factors. Follow your health care provider's recommendations for frequency of eye exams.  Eat a healthy diet. Foods such as vegetables, fruits, whole grains, low-fat dairy products, and lean protein foods contain the nutrients you need without too many calories. Decrease your intake of foods high in solid fats, added sugars, and salt. Eat the right amount of calories for you.Get information about a proper diet from your health care provider, if necessary.  Regular physical exercise is one of the most important things you can do for your health. Most adults should get at least 150 minutes of moderate-intensity exercise (any activity that increases your heart rate and causes you to sweat) each week. In addition, most adults need muscle-strengthening exercises on 2 or more days a week.  Maintain a healthy weight. The body mass index (BMI) is a screening tool to identify possible weight problems. It provides an estimate of body fat based on height and weight. Your  health care provider can find your BMI and can help you achieve or maintain a healthy weight.For adults 20 years and older:  A BMI below 18.5 is considered underweight.  A BMI of 18.5 to 24.9 is normal.  A BMI of 25 to 29.9 is considered overweight.  A BMI of 30 and above is considered obese.  Maintain normal blood lipids and cholesterol levels by exercising and minimizing your intake of saturated fat. Eat a balanced diet with plenty of fruit and vegetables. Blood tests for lipids and cholesterol should begin at age 55 and be repeated every 5 years. If your lipid or cholesterol levels are high, you are over 50, or you are at high risk for heart disease, you may need your cholesterol levels checked more frequently.Ongoing high lipid and cholesterol levels should be treated with medicines if diet and exercise are not working.  If you smoke, find out from your health care provider how to quit. If you do not use tobacco, do not start.  Lung cancer screening is recommended for adults aged 34-80 years who are at high risk for developing lung cancer because of a history of smoking. A yearly low-dose CT scan of the lungs is recommended for people who have at least a 30-pack-year history of smoking and are a current smoker or have quit within the past 15 years. A pack year of smoking is smoking an average of 1 pack of cigarettes a day for 1 year (for example: 1 pack a day for 30 years or 2 packs a day  for 15 years). Yearly screening should continue until the smoker has stopped smoking for at least 15 years. Yearly screening should be stopped for people who develop a health problem that would prevent them from having lung cancer treatment.  If you choose to drink alcohol, do not have more than 2 drinks per day. One drink is considered to be 12 ounces (355 mL) of beer, 5 ounces (148 mL) of wine, or 1.5 ounces (44 mL) of liquor.  Avoid use of street drugs. Do not share needles with anyone. Ask for help if  you need support or instructions about stopping the use of drugs.  High blood pressure causes heart disease and increases the risk of stroke. Your blood pressure should be checked at least every 1-2 years. Ongoing high blood pressure should be treated with medicines, if weight loss and exercise are not effective.  If you are 3-63 years old, ask your health care provider if you should take aspirin to prevent heart disease.  Diabetes screening involves taking a blood sample to check your fasting blood sugar level. This should be done once every 3 years, after age 63, if you are within normal weight and without risk factors for diabetes. Testing should be considered at a younger age or be carried out more frequently if you are overweight and have at least 1 risk factor for diabetes.  Colorectal cancer can be detected and often prevented. Most routine colorectal cancer screening begins at the age of 39 and continues through age 40. However, your health care provider may recommend screening at an earlier age if you have risk factors for colon cancer. On a yearly basis, your health care provider may provide home test kits to check for hidden blood in the stool. Use of a small camera at the end of a tube to directly examine the colon (sigmoidoscopy or colonoscopy) can detect the earliest forms of colorectal cancer. Talk to your health care provider about this at age 62, when routine screening begins. Direct exam of the colon should be repeated every 5-10 years through age 49, unless early forms of precancerous polyps or small growths are found.  People who are at an increased risk for hepatitis B should be screened for this virus. You are considered at high risk for hepatitis B if:  You were born in a country where hepatitis B occurs often. Talk with your health care provider about which countries are considered high risk.  Your parents were born in a high-risk country and you have not received a shot to  protect against hepatitis B (hepatitis B vaccine).  You have HIV or AIDS.  You use needles to inject street drugs.  You live with, or have sex with, someone who has hepatitis B.  You are a man who has sex with other men (MSM).  You get hemodialysis treatment.  You take certain medicines for conditions such as cancer, organ transplantation, and autoimmune conditions.  Hepatitis C blood testing is recommended for all people born from 20 through 1965 and any individual with known risks for hepatitis C.  Practice safe sex. Use condoms and avoid high-risk sexual practices to reduce the spread of sexually transmitted infections (STIs). STIs include gonorrhea, chlamydia, syphilis, trichomonas, herpes, HPV, and human immunodeficiency virus (HIV). Herpes, HIV, and HPV are viral illnesses that have no cure. They can result in disability, cancer, and death.  If you are at risk of being infected with HIV, it is recommended that you take a prescription medicine  daily to prevent HIV infection. This is called preexposure prophylaxis (PrEP). You are considered at risk if:  You are a man who has sex with other men (MSM) and have other risk factors.  You are a heterosexual man, are sexually active, and are at increased risk for HIV infection.  You take drugs by injection.  You are sexually active with a partner who has HIV.  Talk with your health care provider about whether you are at high risk of being infected with HIV. If you choose to begin PrEP, you should first be tested for HIV. You should then be tested every 3 months for as long as you are taking PrEP.  A one-time screening for abdominal aortic aneurysm (AAA) and surgical repair of large AAAs by ultrasound are recommended for men ages 65 to 75 years who are current or former smokers.  Healthy men should no longer receive prostate-specific antigen (PSA) blood tests as part of routine cancer screening. Talk with your health care provider about  prostate cancer screening.  Testicular cancer screening is not recommended for adult males who have no symptoms. Screening includes self-exam, a health care provider exam, and other screening tests. Consult with your health care provider about any symptoms you have or any concerns you have about testicular cancer.  Use sunscreen. Apply sunscreen liberally and repeatedly throughout the day. You should seek shade when your shadow is shorter than you. Protect yourself by wearing long sleeves, pants, a wide-brimmed hat, and sunglasses year round, whenever you are outdoors.  Once a month, do a whole-body skin exam, using a mirror to look at the skin on your back. Tell your health care provider about new moles, moles that have irregular borders, moles that are larger than a pencil eraser, or moles that have changed in shape or color.  Stay current with required vaccines (immunizations).  Influenza vaccine. All adults should be immunized every year.  Tetanus, diphtheria, and acellular pertussis (Td, Tdap) vaccine. An adult who has not previously received Tdap or who does not know his vaccine status should receive 1 dose of Tdap. This initial dose should be followed by tetanus and diphtheria toxoids (Td) booster doses every 10 years. Adults with an unknown or incomplete history of completing a 3-dose immunization series with Td-containing vaccines should begin or complete a primary immunization series including a Tdap dose. Adults should receive a Td booster every 10 years.  Varicella vaccine. An adult without evidence of immunity to varicella should receive 2 doses or a second dose if he has previously received 1 dose.  Human papillomavirus (HPV) vaccine. Males aged 13-21 years who have not received the vaccine previously should receive the 3-dose series. Males aged 22-26 years may be immunized. Immunization is recommended through the age of 26 years for any male who has sex with males and did not get any  or all doses earlier. Immunization is recommended for any person with an immunocompromised condition through the age of 26 years if he did not get any or all doses earlier. During the 3-dose series, the second dose should be obtained 4-8 weeks after the first dose. The third dose should be obtained 24 weeks after the first dose and 16 weeks after the second dose.  Zoster vaccine. One dose is recommended for adults aged 60 years or older unless certain conditions are present.  Measles, mumps, and rubella (MMR) vaccine. Adults born before 1957 generally are considered immune to measles and mumps. Adults born in 1957 or later   should have 1 or more doses of MMR vaccine unless there is a contraindication to the vaccine or there is laboratory evidence of immunity to each of the three diseases. A routine second dose of MMR vaccine should be obtained at least 28 days after the first dose for students attending postsecondary schools, health care workers, or international travelers. People who received inactivated measles vaccine or an unknown type of measles vaccine during 1963-1967 should receive 2 doses of MMR vaccine. People who received inactivated mumps vaccine or an unknown type of mumps vaccine before 1979 and are at high risk for mumps infection should consider immunization with 2 doses of MMR vaccine. Unvaccinated health care workers born before 1957 who lack laboratory evidence of measles, mumps, or rubella immunity or laboratory confirmation of disease should consider measles and mumps immunization with 2 doses of MMR vaccine or rubella immunization with 1 dose of MMR vaccine.  Pneumococcal 13-valent conjugate (PCV13) vaccine. When indicated, a person who is uncertain of his immunization history and has no record of immunization should receive the PCV13 vaccine. An adult aged 19 years or older who has certain medical conditions and has not been previously immunized should receive 1 dose of PCV13 vaccine.  This PCV13 should be followed with a dose of pneumococcal polysaccharide (PPSV23) vaccine. The PPSV23 vaccine dose should be obtained at least 8 weeks after the dose of PCV13 vaccine. An adult aged 19 years or older who has certain medical conditions and previously received 1 or more doses of PPSV23 vaccine should receive 1 dose of PCV13. The PCV13 vaccine dose should be obtained 1 or more years after the last PPSV23 vaccine dose.  Pneumococcal polysaccharide (PPSV23) vaccine. When PCV13 is also indicated, PCV13 should be obtained first. All adults aged 65 years and older should be immunized. An adult younger than age 65 years who has certain medical conditions should be immunized. Any person who resides in a nursing home or long-term care facility should be immunized. An adult smoker should be immunized. People with an immunocompromised condition and certain other conditions should receive both PCV13 and PPSV23 vaccines. People with human immunodeficiency virus (HIV) infection should be immunized as soon as possible after diagnosis. Immunization during chemotherapy or radiation therapy should be avoided. Routine use of PPSV23 vaccine is not recommended for American Indians, [place] Natives, or people younger than 65 years unless there are medical conditions that require PPSV23 vaccine. When indicated, people who have unknown immunization and have no record of immunization should receive PPSV23 vaccine. One-time revaccination 5 years after the first dose of PPSV23 is recommended for people aged 19-64 years who have chronic kidney failure, nephrotic syndrome, asplenia, or immunocompromised conditions. People who received 1-2 doses of PPSV23 before age 65 years should receive another dose of PPSV23 vaccine at age 65 years or later if at least 5 years have passed since the previous dose. Doses of PPSV23 are not needed for people immunized with PPSV23 at or after age 65 years.  Meningococcal vaccine. Adults with  asplenia or persistent complement component deficiencies should receive 2 doses of quadrivalent meningococcal conjugate (MenACWY-D) vaccine. The doses should be obtained at least 2 months apart. Microbiologists working with certain meningococcal bacteria, military recruits, people at risk during an outbreak, and people who travel to or live in countries with a high rate of meningitis should be immunized. A first-year college student up through age 21 years who is living in a residence hall should receive a dose if he did not   receive a dose on or after his 16th birthday. Adults who have certain high-risk conditions should receive one or more doses of vaccine.  Hepatitis A vaccine. Adults who wish to be protected from this disease, have certain high-risk conditions, work with hepatitis A-infected animals, work in hepatitis A research labs, or travel to or work in countries with a high rate of hepatitis A should be immunized. Adults who were previously unvaccinated and who anticipate close contact with an international adoptee during the first 60 days after arrival in the [place] from a country with a high rate of hepatitis A should be immunized.  Hepatitis B vaccine. Adults should be immunized if they wish to be protected from this disease, have certain high-risk conditions, may be exposed to blood or other infectious body fluids, are household contacts or sex partners of hepatitis B positive people, are clients or workers in certain care facilities, or travel to or work in countries with a high rate of hepatitis B.  Haemophilus influenzae type b (Hib) vaccine. A previously unvaccinated person with asplenia or sickle cell disease or having a scheduled splenectomy should receive 1 dose of Hib vaccine. Regardless of previous immunization, a recipient of a hematopoietic stem cell transplant should receive a 3-dose series 6-12 months after his successful transplant. Hib vaccine is not recommended for adults  with HIV infection. Preventive Service / Frequency Ages 19 to 39  Blood pressure check.** / Every 1 to 2 years.  Lipid and cholesterol check.** / Every 5 years beginning at age 20.  Hepatitis C blood test.** / For any individual with known risks for hepatitis C.  Skin self-exam. / Monthly.  Influenza vaccine. / Every year.  Tetanus, diphtheria, and acellular pertussis (Tdap, Td) vaccine.** / Consult your health care provider. 1 dose of Td every 10 years.  Varicella vaccine.** / Consult your health care provider.  HPV vaccine. / 3 doses over 6 months, if 26 or younger.  Measles, mumps, rubella (MMR) vaccine.** / You need at least 1 dose of MMR if you were born in 1957 or later. You may also need a second dose.  Pneumococcal 13-valent conjugate (PCV13) vaccine.** / Consult your health care provider.  Pneumococcal polysaccharide (PPSV23) vaccine.** / 1 to 2 doses if you smoke cigarettes or if you have certain conditions.  Meningococcal vaccine.** / 1 dose if you are age 19 to 21 years and a first-year college student living in a residence hall, or have one of several medical conditions. You may also need additional booster doses.  Hepatitis A vaccine.** / Consult your health care provider.  Hepatitis B vaccine.** / Consult your health care provider.  Haemophilus influenzae type b (Hib) vaccine.** / Consult your health care provider. Ages 40 to 64  Blood pressure check.** / Every 1 to 2 years.  Lipid and cholesterol check.** / Every 5 years beginning at age 20.  Lung cancer screening. / Every year if you are aged 55-80 years and have a 30-pack-year history of smoking and currently smoke or have quit within the past 15 years. Yearly screening is stopped once you have quit smoking for at least 15 years or develop a health problem that would prevent you from having lung cancer treatment.  Fecal occult blood test (FOBT) of stool. / Every year beginning at age 50 and continuing until  age 75. You may not have to do this test if you get a colonoscopy every 10 years.  Flexible sigmoidoscopy** or colonoscopy.** / Every 5 years for   a flexible sigmoidoscopy or every 10 years for a colonoscopy beginning at age 50 and continuing until age 75.  Hepatitis C blood test.** / For all people born from 1945 through 1965 and any individual with known risks for hepatitis C.  Skin self-exam. / Monthly.  Influenza vaccine. / Every year.  Tetanus, diphtheria, and acellular pertussis (Tdap/Td) vaccine.** / Consult your health care provider. 1 dose of Td every 10 years.  Varicella vaccine.** / Consult your health care provider.  Zoster vaccine.** / 1 dose for adults aged 60 years or older.  Measles, mumps, rubella (MMR) vaccine.** / You need at least 1 dose of MMR if you were born in 1957 or later. You may also need a second dose.  Pneumococcal 13-valent conjugate (PCV13) vaccine.** / Consult your health care provider.  Pneumococcal polysaccharide (PPSV23) vaccine.** / 1 to 2 doses if you smoke cigarettes or if you have certain conditions.  Meningococcal vaccine.** / Consult your health care provider.  Hepatitis A vaccine.** / Consult your health care provider.  Hepatitis B vaccine.** / Consult your health care provider.  Haemophilus influenzae type b (Hib) vaccine.** / Consult your health care provider. Ages 65 and over  Blood pressure check.** / Every 1 to 2 years.  Lipid and cholesterol check.**/ Every 5 years beginning at age 20.  Lung cancer screening. / Every year if you are aged 55-80 years and have a 30-pack-year history of smoking and currently smoke or have quit within the past 15 years. Yearly screening is stopped once you have quit smoking for at least 15 years or develop a health problem that would prevent you from having lung cancer treatment.  Fecal occult blood test (FOBT) of stool. / Every year beginning at age 50 and continuing until age 75. You may not have to  do this test if you get a colonoscopy every 10 years.  Flexible sigmoidoscopy** or colonoscopy.** / Every 5 years for a flexible sigmoidoscopy or every 10 years for a colonoscopy beginning at age 50 and continuing until age 75.  Hepatitis C blood test.** / For all people born from 1945 through 1965 and any individual with known risks for hepatitis C.  Abdominal aortic aneurysm (AAA) screening.** / A one-time screening for ages 65 to 75 years who are current or former smokers.  Skin self-exam. / Monthly.  Influenza vaccine. / Every year.  Tetanus, diphtheria, and acellular pertussis (Tdap/Td) vaccine.** / 1 dose of Td every 10 years.  Varicella vaccine.** / Consult your health care provider.  Zoster vaccine.** / 1 dose for adults aged 60 years or older.  Pneumococcal 13-valent conjugate (PCV13) vaccine.** / Consult your health care provider.  Pneumococcal polysaccharide (PPSV23) vaccine.** / 1 dose for all adults aged 65 years and older.  Meningococcal vaccine.** / Consult your health care provider.  Hepatitis A vaccine.** / Consult your health care provider.  Hepatitis B vaccine.** / Consult your health care provider.  Haemophilus influenzae type b (Hib) vaccine.** / Consult your health care provider. **Family history and personal history of risk and conditions may change your health care provider's recommendations. Document Released: 03/15/2001 Document Revised: 01/22/2013 Document Reviewed: 06/14/2010 [employer] Patient Information 2015 [employer]. This information is not intended to replace advice given to you by your health care provider. Make sure you discuss any questions you have with your health care provider.    

## 2014-10-13 NOTE — Assessment & Plan Note (Signed)
Well-controlled. Continue current regimen. Weight loss encouraged. GERD diet reviewed.

## 2014-10-13 NOTE — Assessment & Plan Note (Signed)
Depression screen negative. Health Maintenance reviewed -- HIV and Hep C screen will be obtained today. Colonoscopy up-to-date. IFOB given. Flu shot given. Patient to return for shingles vaccination. Preventive schedule discussed and handout given in AVS. Will obtain fasting labs today.

## 2014-10-13 NOTE — Assessment & Plan Note (Signed)
Well-controlled.  Continue current regimen. 

## 2014-10-13 NOTE — Progress Notes (Signed)
Pre visit review using our clinic review tool, if applicable. No additional management support is needed unless otherwise documented below in the visit note/SLS  

## 2014-10-14 ENCOUNTER — Other Ambulatory Visit (INDEPENDENT_AMBULATORY_CARE_PROVIDER_SITE_OTHER): Payer: 59

## 2014-10-14 ENCOUNTER — Encounter: Payer: Self-pay | Admitting: *Deleted

## 2014-10-14 ENCOUNTER — Encounter: Payer: Self-pay | Admitting: Physician Assistant

## 2014-10-14 ENCOUNTER — Telehealth: Payer: Self-pay | Admitting: *Deleted

## 2014-10-14 DIAGNOSIS — R7309 Other abnormal glucose: Secondary | ICD-10-CM | POA: Diagnosis not present

## 2014-10-14 DIAGNOSIS — R7303 Prediabetes: Secondary | ICD-10-CM

## 2014-10-14 LAB — HIV ANTIBODY (ROUTINE TESTING W REFLEX): HIV: NONREACTIVE

## 2014-10-14 LAB — HEMOGLOBIN A1C: HEMOGLOBIN A1C: 5.5 % (ref 4.6–6.5)

## 2014-10-14 LAB — HEPATITIS C ANTIBODY: HCV AB: NEGATIVE

## 2014-10-14 MED ORDER — SIMVASTATIN 20 MG PO TABS: 20.0000 mg | ORAL_TABLET | Freq: Every day | ORAL | Status: DC

## 2014-10-14 NOTE — Telephone Encounter (Signed)
-----   Message from Brunetta Jeans, PA-C sent at 10/14/2014 10:59 AM EDT ----- (1) Hepatitis C and HIV antibodies are negative. (2) Kidney and liver function look good. A1C at 5.5 so no diabetes. Increase exercise and limit carb intake. (3) PSA is good. (4) Thyroid function is within normal limits. (5) Cholesterol is elevated with LDL minimally elevated. TGL are high so simvastatin dose is not sufficient. Would like to increase simvastatin Rx to 20 mg daily. Ok to send in new Rx. Follow-up 6 months to recheck lipid panel.

## 2014-10-14 NOTE — Telephone Encounter (Signed)
Patient informed, understood & agreed; will mail copy at patient request, new Rx to pharmacy/SLS

## 2014-10-27 ENCOUNTER — Other Ambulatory Visit (INDEPENDENT_AMBULATORY_CARE_PROVIDER_SITE_OTHER): Payer: 59

## 2014-10-27 DIAGNOSIS — Z1211 Encounter for screening for malignant neoplasm of colon: Secondary | ICD-10-CM

## 2014-10-27 DIAGNOSIS — Z0000 Encounter for general adult medical examination without abnormal findings: Secondary | ICD-10-CM

## 2014-10-27 LAB — FECAL OCCULT BLOOD, IMMUNOCHEMICAL: FECAL OCCULT BLD: NEGATIVE

## 2014-12-24 ENCOUNTER — Encounter: Payer: Self-pay | Admitting: Physician Assistant

## 2015-01-03 ENCOUNTER — Encounter: Payer: Self-pay | Admitting: Physician Assistant

## 2015-04-03 ENCOUNTER — Telehealth: Payer: Self-pay | Admitting: Physician Assistant

## 2015-04-03 MED ORDER — SILDENAFIL CITRATE 20 MG PO TABS: 20.0000 mg | ORAL_TABLET | ORAL | Status: DC | PRN

## 2015-04-03 NOTE — Telephone Encounter (Signed)
Medication sent.

## 2015-04-03 NOTE — Telephone Encounter (Signed)
Notified pt. 

## 2015-04-03 NOTE — Telephone Encounter (Signed)
Pharmacy: [facility] [id number], [place]  Reason for call: pt requesting refill on sildenifil. He has about 3-4 left. He takes 1-2 tablets prn. He is requesting it sent in today to pick up tomorrow.

## 2015-04-07 ENCOUNTER — Encounter: Payer: Self-pay | Admitting: Physician Assistant

## 2015-04-07 ENCOUNTER — Ambulatory Visit (INDEPENDENT_AMBULATORY_CARE_PROVIDER_SITE_OTHER): Payer: 59 | Admitting: Physician Assistant

## 2015-04-07 VITALS — BP 136/80 | HR 78 | Temp 98.6°F | Ht 70.5 in | Wt 248.8 lb

## 2015-04-07 DIAGNOSIS — R1031 Right lower quadrant pain: Secondary | ICD-10-CM

## 2015-04-07 DIAGNOSIS — Z0184 Encounter for antibody response examination: Secondary | ICD-10-CM

## 2015-04-07 DIAGNOSIS — R1032 Left lower quadrant pain: Secondary | ICD-10-CM | POA: Diagnosis not present

## 2015-04-07 DIAGNOSIS — G8929 Other chronic pain: Secondary | ICD-10-CM

## 2015-04-07 DIAGNOSIS — E785 Hyperlipidemia, unspecified: Secondary | ICD-10-CM | POA: Diagnosis not present

## 2015-04-07 NOTE — Progress Notes (Signed)
Pre visit review using our clinic review tool, if applicable. No additional management support is needed unless otherwise documented below in the visit note. 

## 2015-04-07 NOTE — Patient Instructions (Signed)
Please go to the lab for blood work. I will call with your results. Once results are in we will proceed with CT scan.  Continue chronic medications as directed.

## 2015-04-07 NOTE — Progress Notes (Signed)
Patient presents to clinic today for 6 month follow-up of hyperlipidemia. Patient would also like to discuss shingles shot.   Hyperlipidemia -- Is taking simvastatin daily as directed.  Denies side effect of medication. Has not made changes to diet or exercise regimen.   Patient c/o 3-4 months of intermittent lower abdominal pain bilaterally. Endorses pain described as aching. Denies pain with eating or defecation. Denies nausea/vomitiung, constipation or diarrhea. Is overdue for repeat Colonoscopy dye to history of polyp. Denies fever, chills or unexplained weight changes..   Past Medical History  Diagnosis Date  . Depression   . GERD (gastroesophageal reflux disease)   . Hyperlipidemia   . Hypertension   . Bulging lumbar disc   . Spinal stenosis   . Colon polyps     Current Outpatient Prescriptions on File Prior to Visit  Medication Sig Dispense Refill  . aspirin 81 MG tablet Take 81 mg by mouth daily.      Marland Kitchen buPROPion (WELLBUTRIN XL) 300 MG 24 hr tablet TAKE ONE TABLET BY MOUTH ONE TIME DAILY 90 tablet 2  . metoprolol (LOPRESSOR) 50 MG tablet TAKE ONE TABLET BY MOUTH TWICE DAILY 200 tablet 2  . Multiple Vitamin (MULTI VITAMIN DAILY PO) Take 1 tablet by mouth daily.    Marland Kitchen omeprazole (PRILOSEC) 20 MG capsule TAKE ONE CAPSULE BY MOUTH ONE TIME DAILY 90 capsule 2  . oxyCODONE (OXY IR/ROXICODONE) 5 MG immediate release tablet Take 1-2 tablets (5-10 mg total) by mouth every 3 (three) hours as needed for moderate pain. 50 tablet 0  . sildenafil (REVATIO) 20 MG tablet Take 1-2 tablets (20-40 mg total) by mouth as needed. 20 tablet 0  . triamterene-hydrochlorothiazide (MAXZIDE-25) 37.5-25 MG per tablet TAKE ONE TABLET BY MOUTH ONE TIME DAILY 100 tablet 2   No current facility-administered medications on file prior to visit.    No Known Allergies  Family History  Problem Relation Age of Onset  . Hypertension Father 64    Deceased  . Kidney cancer Mother 20    Deceased  . Kidney  disease Mother   . Neuropathy Father   . Kidney cancer Maternal Grandmother   . Cancer Other     Aunts & Uncles  . Diabetes Brother   . Breast cancer Sister   . Healthy Brother     x1  . Healthy Daughter     x2    Social History   Social History  . Marital Status: Single    Spouse Name: N/A  . Number of Children: 2  . Years of Education: N/A   Occupational History  . CFO    Social History Main Topics  . Smoking status: Former Research scientist (life sciences)  . Smokeless tobacco: Never Used  . Alcohol Use: 0.0 oz/week    0 Standard drinks or equivalent per week     Comment: social  . Drug Use: No  . Sexual Activity: Yes   Other Topics Concern  . None   Social History Narrative   Review of Systems - See HPI.  All other ROS are negative.  BP 136/80 mmHg  Pulse 78  Temp(Src) 98.6 F (37 C) (Oral)  Ht 5' 10.5" (1.791 m)  Wt 248 lb 12.8 oz (112.855 kg)  BMI 35.18 kg/m2  SpO2 98%  Physical Exam  Constitutional: He is oriented to person, place, and time and well-developed, well-nourished, and in no distress.  HENT:  Head: Normocephalic and atraumatic.  Eyes: Conjunctivae are normal.  Neck: Neck supple.  Cardiovascular:  Normal rate, regular rhythm, normal heart sounds and intact distal pulses.   Pulmonary/Chest: Effort normal and breath sounds normal. No respiratory distress. He has no wheezes. He has no rales. He exhibits no tenderness.  Abdominal: Soft. Bowel sounds are normal. He exhibits no distension and no mass. There is no tenderness. There is no rebound and no guarding.  Neurological: He is alert and oriented to person, place, and time.  Skin: Skin is warm and dry. No rash noted.  Psychiatric: Affect normal.    Recent Results (from the past 2160 hour(s))  Lipid Profile     Status: Abnormal   Collection Time: 04/07/15  3:11 PM  Result Value Ref Range   Cholesterol 226 (H) 0 - 200 mg/dL    Comment: ATP III Classification       Desirable:  < 200 mg/dL               Borderline  High:  200 - 239 mg/dL          High:  > = 240 mg/dL   Triglycerides 354.0 (H) 0.0 - 149.0 mg/dL    Comment: Normal:  <150 mg/dLBorderline High:  150 - 199 mg/dL   HDL 46.10 >39.00 mg/dL   VLDL 70.8 (H) 0.0 - 40.0 mg/dL   Total CHOL/HDL Ratio 5     Comment:                Men          Women1/2 Average Risk     3.4          3.3Average Risk          5.0          4.42X Average Risk          9.6          7.13X Average Risk          15.0          11.0                       NonHDL 179.56     Comment: NOTE:  Non-HDL goal should be 30 mg/dL higher than patient's LDL goal (i.e. LDL goal of < 70 mg/dL, would have non-HDL goal of < 100 mg/dL)  Comp Met (CMET)     Status: None   Collection Time: 04/07/15  3:11 PM  Result Value Ref Range   Sodium 138 135 - 145 mEq/L   Potassium 4.1 3.5 - 5.1 mEq/L   Chloride 99 96 - 112 mEq/L   CO2 31 19 - 32 mEq/L   Glucose, Bld 96 70 - 99 mg/dL   BUN 15 6 - 23 mg/dL   Creatinine, Ser 1.05 0.40 - 1.50 mg/dL   Total Bilirubin 0.6 0.2 - 1.2 mg/dL   Alkaline Phosphatase 78 39 - 117 U/L   AST 20 0 - 37 U/L   ALT 28 0 - 53 U/L   Total Protein 7.2 6.0 - 8.3 g/dL   Albumin 4.5 3.5 - 5.2 g/dL   Calcium 9.8 8.4 - 10.5 mg/dL   GFR 76.20 >60.00 mL/min  CBC     Status: None   Collection Time: 04/07/15  3:11 PM  Result Value Ref Range   WBC 9.2 4.0 - 10.5 K/uL   RBC 4.71 4.22 - 5.81 Mil/uL   Platelets 230.0 150.0 - 400.0 K/uL   Hemoglobin 15.1 13.0 - 17.0 g/dL   HCT 44.1 39.0 -  52.0 %   MCV 93.5 78.0 - 100.0 fl   MCHC 34.3 30.0 - 36.0 g/dL   RDW 13.2 11.5 - 15.5 %  Urinalysis, Routine w reflex microscopic     Status: None   Collection Time: 04/07/15  3:11 PM  Result Value Ref Range   Color, Urine YELLOW Yellow;Lt. Yellow   APPearance CLEAR Clear   Specific Gravity, Urine 1.010 1.000-1.030   pH 6.0 5.0 - 8.0   Total Protein, Urine NEGATIVE Negative   Urine Glucose NEGATIVE Negative   Ketones, ur NEGATIVE Negative   Bilirubin Urine NEGATIVE Negative   Hgb urine  dipstick NEGATIVE Negative   Urobilinogen, UA 0.2 0.0 - 1.0   Leukocytes, UA NEGATIVE Negative   Nitrite NEGATIVE Negative   WBC, UA none seen 0-2/hpf   RBC / HPF none seen 0-2/hpf  LDL cholesterol, direct     Status: None   Collection Time: 04/07/15  3:11 PM  Result Value Ref Range   Direct LDL 124.0 mg/dL    Comment: Optimal:  <100 mg/dLNear or Above Optimal:  100-129 mg/dLBorderline High:  130-159 mg/dLHigh:  160-189 mg/dLVery High:  >190 mg/dL    Assessment/Plan: Hyperlipidemia Will check fasting labs to assess. Continue current regimen for now.  Immunity status testing Varicella antibody ordered to assess for immunity before giving shingles vaccination.  Chronic bilateral lower abdominal pain Will check CBC, CMP and UA today. Referral to GI already in works for colonoscopy and assessment. Discussed need for imaging giving chronicity of pain. Will obtain CT once lab results are in.

## 2015-04-08 LAB — URINALYSIS, ROUTINE W REFLEX MICROSCOPIC
Bilirubin Urine: NEGATIVE
HGB URINE DIPSTICK: NEGATIVE
Ketones, ur: NEGATIVE
Leukocytes, UA: NEGATIVE
Nitrite: NEGATIVE
PH: 6 (ref 5.0–8.0)
RBC / HPF: NONE SEEN (ref 0–?)
Specific Gravity, Urine: 1.01 (ref 1.000–1.030)
TOTAL PROTEIN, URINE-UPE24: NEGATIVE
Urine Glucose: NEGATIVE
Urobilinogen, UA: 0.2 (ref 0.0–1.0)
WBC UA: NONE SEEN (ref 0–?)

## 2015-04-08 LAB — COMPREHENSIVE METABOLIC PANEL
ALT: 28 U/L (ref 0–53)
AST: 20 U/L (ref 0–37)
Albumin: 4.5 g/dL (ref 3.5–5.2)
Alkaline Phosphatase: 78 U/L (ref 39–117)
BUN: 15 mg/dL (ref 6–23)
CO2: 31 mEq/L (ref 19–32)
Calcium: 9.8 mg/dL (ref 8.4–10.5)
Chloride: 99 mEq/L (ref 96–112)
Creatinine, Ser: 1.05 mg/dL (ref 0.40–1.50)
GFR: 76.2 mL/min (ref >60.00)
Glucose, Bld: 96 mg/dL (ref 70–99)
Potassium: 4.1 mEq/L (ref 3.5–5.1)
Sodium: 138 mEq/L (ref 135–145)
Total Bilirubin: 0.6 mg/dL (ref 0.2–1.2)
Total Protein: 7.2 g/dL (ref 6.0–8.3)

## 2015-04-08 LAB — CBC
HCT: 44.1 % (ref 39.0–52.0)
Hemoglobin: 15.1 g/dL (ref 13.0–17.0)
MCHC: 34.3 g/dL (ref 30.0–36.0)
MCV: 93.5 fl (ref 78.0–100.0)
Platelets: 230 10*3/uL (ref 150.0–400.0)
RBC: 4.71 Mil/uL (ref 4.22–5.81)
RDW: 13.2 % (ref 11.5–15.5)
WBC: 9.2 10*3/uL (ref 4.0–10.5)

## 2015-04-08 LAB — LIPID PANEL
Cholesterol: 226 mg/dL — ABNORMAL HIGH (ref 0–200)
HDL: 46.1 mg/dL (ref >39.00)
NonHDL: 179.56
Total CHOL/HDL Ratio: 5
Triglycerides: 354 mg/dL — ABNORMAL HIGH (ref 0.0–149.0)
VLDL: 70.8 mg/dL — ABNORMAL HIGH (ref 0.0–40.0)

## 2015-04-08 LAB — LDL CHOLESTEROL, DIRECT: Direct LDL: 124 mg/dL

## 2015-04-09 MED ORDER — SIMVASTATIN 40 MG PO TABS: 40.0000 mg | ORAL_TABLET | Freq: Every day | ORAL | Status: DC

## 2015-04-12 ENCOUNTER — Telehealth: Payer: Self-pay | Admitting: Physician Assistant

## 2015-04-12 DIAGNOSIS — G8929 Other chronic pain: Secondary | ICD-10-CM | POA: Insufficient documentation

## 2015-04-12 DIAGNOSIS — R1031 Right lower quadrant pain: Secondary | ICD-10-CM

## 2015-04-12 DIAGNOSIS — R1032 Left lower quadrant pain: Principal | ICD-10-CM

## 2015-04-12 DIAGNOSIS — Z0184 Encounter for antibody response examination: Secondary | ICD-10-CM | POA: Insufficient documentation

## 2015-04-12 NOTE — Telephone Encounter (Signed)
Had discussed with patient at visit that we would proceed with CT scan once labs were in. He is being set up with GI for assessment but I would like to proceed with CT if patient agreeable. Let me know and I will place order.

## 2015-04-12 NOTE — Assessment & Plan Note (Addendum)
Will check CBC, CMP and UA today. Referral to GI already in works for colonoscopy and assessment. Discussed need for imaging giving chronicity of pain. Will obtain CT once lab results are in.

## 2015-04-12 NOTE — Assessment & Plan Note (Signed)
Will check fasting labs to assess. Continue current regimen for now.

## 2015-04-12 NOTE — Assessment & Plan Note (Signed)
Varicella antibody ordered to assess for immunity before giving shingles vaccination.

## 2015-04-13 NOTE — Telephone Encounter (Signed)
Called and [facility] @ 8:31am @ [phone]) asking the pt to RTC regarding note below.//AB/CMA

## 2015-04-13 NOTE — Telephone Encounter (Signed)
Spoke with the pt and informed him of the note below.  Pt verbalized understanding and agreed to have the CT scan.  Also informed him of recent lab results and note.  Informed him that the labs show immunity to varicella, and he can schedule for Zostavax.  Pt verbalized understanding and stated that he will call back to schedule a nurse visit to get the Zostavax.//AB/CMA

## 2015-04-13 NOTE — Telephone Encounter (Signed)
Order for CT placed

## 2015-04-22 ENCOUNTER — Ambulatory Visit (HOSPITAL_BASED_OUTPATIENT_CLINIC_OR_DEPARTMENT_OTHER): Admission: RE | Admit: 2015-04-22 | Payer: 59 | Source: Ambulatory Visit

## 2015-04-23 ENCOUNTER — Telehealth: Payer: Self-pay | Admitting: Physician Assistant

## 2015-04-23 NOTE — Telephone Encounter (Signed)
Error

## 2015-05-07 ENCOUNTER — Encounter (HOSPITAL_BASED_OUTPATIENT_CLINIC_OR_DEPARTMENT_OTHER): Payer: Self-pay

## 2015-05-07 ENCOUNTER — Ambulatory Visit (HOSPITAL_BASED_OUTPATIENT_CLINIC_OR_DEPARTMENT_OTHER)
Admission: RE | Admit: 2015-05-07 | Discharge: 2015-05-07 | Disposition: A | Discharge status: Home / Self Care | Payer: 59 | Source: Ambulatory Visit | Attending: Physician Assistant | Admitting: Physician Assistant

## 2015-05-07 DIAGNOSIS — R1031 Right lower quadrant pain: Secondary | ICD-10-CM | POA: Insufficient documentation

## 2015-05-07 DIAGNOSIS — I708 Atherosclerosis of other arteries: Secondary | ICD-10-CM | POA: Insufficient documentation

## 2015-05-07 DIAGNOSIS — R1032 Left lower quadrant pain: Secondary | ICD-10-CM | POA: Insufficient documentation

## 2015-05-07 DIAGNOSIS — I700 Atherosclerosis of aorta: Secondary | ICD-10-CM | POA: Insufficient documentation

## 2015-05-07 MED ORDER — IOPAMIDOL (ISOVUE-300) INJECTION 61%: 100.0000 mL | Freq: Once | INTRAVENOUS | Status: DC | PRN

## 2015-05-08 NOTE — Telephone Encounter (Signed)
See result note. Please help [place] contact the patient.

## 2015-05-08 NOTE — Telephone Encounter (Signed)
Pt called in to get imaging results.    CB: [phone]

## 2015-05-08 NOTE — Telephone Encounter (Signed)
Have spoken to this patient please see result note reponse.

## 2015-05-10 ENCOUNTER — Other Ambulatory Visit: Payer: Self-pay | Admitting: Physician Assistant

## 2015-05-10 DIAGNOSIS — I7090 Unspecified atherosclerosis: Secondary | ICD-10-CM

## 2015-05-18 ENCOUNTER — Encounter: Payer: Self-pay | Admitting: Physician Assistant

## 2015-06-17 ENCOUNTER — Other Ambulatory Visit: Payer: Self-pay | Admitting: Family Medicine

## 2015-06-17 ENCOUNTER — Telehealth: Payer: Self-pay | Admitting: Physician Assistant

## 2015-06-17 MED ORDER — BUPROPION HCL ER (XL) 300 MG PO TB24: 300.0000 mg | ORAL_TABLET | Freq: Every day | ORAL | Status: DC

## 2015-06-17 MED ORDER — OMEPRAZOLE 20 MG PO CPDR: 20.0000 mg | DELAYED_RELEASE_CAPSULE | Freq: Every day | ORAL | Status: DC

## 2015-06-17 NOTE — Telephone Encounter (Signed)
Pt called in because he says that two of his medications were denied. 1. Bupropion and 2. Omeprazole. Showing Dr. Sherren Mocha as provider in chart. Pt says that he no longer see that provider, he retired. Pt would like to have a new prescription for medications.      CB: [phone]   Pharmacy :  [facility], [place]

## 2015-06-17 NOTE — Telephone Encounter (Signed)
Refill sent per [facility] refill protocol/SLS  

## 2015-06-25 ENCOUNTER — Encounter: Payer: Self-pay | Admitting: Physician Assistant

## 2015-07-21 ENCOUNTER — Other Ambulatory Visit: Payer: Self-pay | Admitting: Family Medicine

## 2015-07-29 ENCOUNTER — Other Ambulatory Visit: Payer: Self-pay | Admitting: Physician Assistant

## 2015-07-29 NOTE — Telephone Encounter (Signed)
Refill sent per [facility] refill protocol/SLS  

## 2015-10-01 ENCOUNTER — Encounter: Payer: Self-pay | Admitting: Family Medicine

## 2015-10-01 ENCOUNTER — Encounter: Payer: Self-pay | Admitting: Physician Assistant

## 2015-10-01 ENCOUNTER — Ambulatory Visit (INDEPENDENT_AMBULATORY_CARE_PROVIDER_SITE_OTHER): Payer: 59 | Admitting: Family Medicine

## 2015-10-01 VITALS — BP 140/60 | HR 123 | Temp 99.0°F | Ht 70.5 in | Wt 245.6 lb

## 2015-10-01 DIAGNOSIS — K2970 Gastritis, unspecified, without bleeding: Secondary | ICD-10-CM | POA: Diagnosis not present

## 2015-10-01 MED ORDER — ONDANSETRON 4 MG PO TBDP: 4.0000 mg | ORAL_TABLET | Freq: Three times a day (TID) | ORAL | 0 refills | Status: DC | PRN

## 2015-10-01 MED ORDER — DICYCLOMINE HCL 10 MG PO CAPS: 10.0000 mg | ORAL_CAPSULE | Freq: Three times a day (TID) | ORAL | 0 refills | Status: DC

## 2015-10-01 NOTE — Progress Notes (Signed)
Chief Complaint  Patient presents with  . Abdominal Pain    all over-started on Tues night   Subjective:0 Patient is a 60 y.o. male here for abdominal pain.  2 days ago, went to [employer] for a meal. Had some nausea and pain, tried to work the following day and couldn't tolerate it. Currently having diffuse pain with some nausea without vomiting or diarrhea. Stool has been norma. Dull and throbbing radiates to his sides. Worse on the R side. No D/C/V or bleeding. No appetite, no sick contacts.  ROS: Abd: +abd pain and N, denies diarrhea, constipation, blood in the stool, or vomiting Gen: No fevers  Family History  Problem Relation Age of Onset  . Hypertension Father 13    Deceased  . Kidney cancer Mother 46    Deceased  . Kidney disease Mother   . Neuropathy Father   . Kidney cancer Maternal Grandmother   . Cancer Other     Aunts & Uncles  . Diabetes Brother   . Breast cancer Sister   . Healthy Brother     x1  . Healthy Daughter     x2   Past Medical History:  Diagnosis Date  . Bulging lumbar disc   . Colon polyps   . Depression   . GERD (gastroesophageal reflux disease)   . Hyperlipidemia   . Hypertension   . Spinal stenosis    No Known Allergies  Current Outpatient Prescriptions:  .  aspirin 81 MG tablet, Take 81 mg by mouth daily.  , Disp: , Rfl:  .  buPROPion (WELLBUTRIN XL) 300 MG 24 hr tablet, Take 1 tablet (300 mg total) by mouth daily., Disp: 90 tablet, Rfl: 1 .  metoprolol (LOPRESSOR) 50 MG tablet, TAKE ONE TABLET BY MOUTH TWICE DAILY, Disp: 200 tablet, Rfl: 2 .  Multiple Vitamin (MULTI VITAMIN DAILY PO), Take 1 tablet by mouth daily., Disp: , Rfl:  .  omeprazole (PRILOSEC) 20 MG capsule, Take 1 capsule (20 mg total) by mouth daily., Disp: 90 capsule, Rfl: 1 .  oxyCODONE (OXY IR/ROXICODONE) 5 MG immediate release tablet, Take 1-2 tablets (5-10 mg total) by mouth every 3 (three) hours as needed for moderate pain., Disp: 50 tablet, Rfl: 0 .  sildenafil  (REVATIO) 20 MG tablet, Take 1-2 tablets (20-40 mg total) by mouth as needed., Disp: 20 tablet, Rfl: 0 .  simvastatin (ZOCOR) 40 MG tablet, Take 1 tablet (40 mg total) by mouth at bedtime., Disp: 90 tablet, Rfl: 1 .  triamterene-hydrochlorothiazide (MAXZIDE-25) 37.5-25 MG tablet, TAKE ONE TABLET BY MOUTH ONE TIME DAILY, Disp: 90 tablet, Rfl: 1 .  dicyclomine (BENTYL) 10 MG capsule, Take 1 capsule (10 mg total) by mouth 4 (four) times daily -  before meals and at bedtime., Disp: 60 capsule, Rfl: 0 .  ondansetron (ZOFRAN-ODT) 4 MG disintegrating tablet, Take 1 tablet (4 mg total) by mouth every 8 (eight) hours as needed for nausea or vomiting., Disp: 30 tablet, Rfl: 0  Objective: BP 140/60 (BP Location: Left Arm, Patient Position: Sitting, Cuff Size: Large)   Pulse (!) 123   Temp 99 F (37.2 C) (Oral)   Ht 5' 10.5" (1.791 m)   Wt 245 lb 9.6 oz (111.4 kg)   SpO2 98%   BMI 34.74 kg/m  General: Awake, appears stated age HEENT: EOMi, grey ring around irises b/l Heart: RRR, no murmurs Lungs: CTAB, no rales, wheezes or rhonchi. Normal effort Abd: BS+, soft, TTP diffusely with most pain LUQ; neg Murphy's, Rovsing's, McBurneys',  Carnett's, mildly distended, no masses or organomegaly Psych: Age appropriate judgment and insight, normal affect and mood  Assessment and Plan: Gastritis - Plan: ondansetron (ZOFRAN-ODT) 4 MG disintegrating tablet, dicyclomine (BENTYL) 10 MG capsule  Orders as above. Info regarding viral gastritis given. F/u if symptoms worsen or fail to improve. The patient voiced understanding and agreement to the plan.  Antonio Cooper

## 2015-10-01 NOTE — Progress Notes (Signed)
Pre visit review using our clinic review tool, if applicable. No additional management support is needed unless otherwise documented below in the visit note. 

## 2015-10-01 NOTE — Patient Instructions (Addendum)
Nausea, Adult °Nausea is the feeling that you have an upset stomach or have to vomit. Nausea by itself is not likely a serious concern, but it may be an early sign of more serious medical problems. As nausea gets worse, it can lead to vomiting. If vomiting develops, there is the risk of dehydration.  °CAUSES  °· Viral infections. °· Food poisoning. °· Medicines. °· Pregnancy. °· Motion sickness. °· Migraine headaches. °· Emotional distress. °· Severe pain from any source. °· Alcohol intoxication. °HOME CARE INSTRUCTIONS °· Get plenty of rest. °· Ask your caregiver about specific rehydration instructions. °· Eat small amounts of food and sip liquids more often. °· Take all medicines as told by your caregiver. °SEEK MEDICAL CARE IF: °· You have not improved after 2 days, or you get worse. °· You have a headache. °SEEK IMMEDIATE MEDICAL CARE IF:  °· You have a fever. °· You faint. °· You keep vomiting or have blood in your vomit. °· You are extremely weak or dehydrated. °· You have dark or bloody stools. °· You have severe chest or abdominal pain. °MAKE SURE YOU: °· Understand these instructions. °· Will watch your condition. °· Will get help right away if you are not doing well or get worse. °  °This information is not intended to replace advice given to you by your health care provider. Make sure you discuss any questions you have with your health care provider. °  °Document Released: 02/25/2004 Document Revised: 02/07/2014 Document Reviewed: 09/29/2010 °Elsevier Interactive Patient Education ©2016 [employer] ° °

## 2015-10-02 ENCOUNTER — Encounter: Payer: Self-pay | Admitting: Physician Assistant

## 2015-10-14 ENCOUNTER — Encounter: Payer: Self-pay | Admitting: Physician Assistant

## 2015-10-16 ENCOUNTER — Encounter: Payer: Self-pay | Admitting: Physician Assistant

## 2015-10-21 ENCOUNTER — Other Ambulatory Visit: Payer: Self-pay | Admitting: Physician Assistant

## 2015-10-21 DIAGNOSIS — E785 Hyperlipidemia, unspecified: Secondary | ICD-10-CM

## 2015-10-21 NOTE — Telephone Encounter (Signed)
Rx request to pharmacy/SLS  

## 2015-10-25 ENCOUNTER — Encounter: Payer: Self-pay | Admitting: Physician Assistant

## 2015-10-27 ENCOUNTER — Encounter: Payer: Self-pay | Admitting: Physician Assistant

## 2015-10-28 ENCOUNTER — Encounter: Payer: Self-pay | Admitting: Physician Assistant

## 2015-10-28 ENCOUNTER — Ambulatory Visit (INDEPENDENT_AMBULATORY_CARE_PROVIDER_SITE_OTHER): Payer: 59 | Admitting: Physician Assistant

## 2015-10-28 VITALS — BP 134/84 | HR 86 | Temp 97.8°F | Resp 16 | Ht 71.0 in | Wt 240.1 lb

## 2015-10-28 DIAGNOSIS — R399 Unspecified symptoms and signs involving the genitourinary system: Secondary | ICD-10-CM | POA: Diagnosis not present

## 2015-10-28 DIAGNOSIS — Z0000 Encounter for general adult medical examination without abnormal findings: Secondary | ICD-10-CM | POA: Diagnosis not present

## 2015-10-28 DIAGNOSIS — E785 Hyperlipidemia, unspecified: Secondary | ICD-10-CM

## 2015-10-28 DIAGNOSIS — F419 Anxiety disorder, unspecified: Secondary | ICD-10-CM

## 2015-10-28 DIAGNOSIS — F418 Other specified anxiety disorders: Secondary | ICD-10-CM

## 2015-10-28 DIAGNOSIS — Z125 Encounter for screening for malignant neoplasm of prostate: Secondary | ICD-10-CM | POA: Diagnosis not present

## 2015-10-28 DIAGNOSIS — F32A Depression, unspecified: Secondary | ICD-10-CM

## 2015-10-28 DIAGNOSIS — Z23 Encounter for immunization: Secondary | ICD-10-CM

## 2015-10-28 DIAGNOSIS — Z0001 Encounter for general adult medical examination with abnormal findings: Secondary | ICD-10-CM

## 2015-10-28 DIAGNOSIS — F329 Major depressive disorder, single episode, unspecified: Secondary | ICD-10-CM

## 2015-10-28 DIAGNOSIS — I10 Essential (primary) hypertension: Secondary | ICD-10-CM

## 2015-10-28 LAB — POCT URINALYSIS DIPSTICK
BILIRUBIN UA: NEGATIVE
Blood, UA: NEGATIVE
Glucose, UA: NEGATIVE
KETONES UA: NEGATIVE
LEUKOCYTES UA: NEGATIVE
NITRITE UA: NEGATIVE
PROTEIN UA: NEGATIVE
Spec Grav, UA: >=1.03
Urobilinogen, UA: 0.2
pH, UA: 6

## 2015-10-28 NOTE — Progress Notes (Signed)
Pre visit review using our clinic review tool, if applicable. No additional management support is needed unless otherwise documented below in the visit note/SLS  

## 2015-10-28 NOTE — Patient Instructions (Signed)
Please go to the lab for blood work.   Our office will call you with your results unless you have chosen to receive results via MyChart.  If your blood work is normal we will follow-up each year for physicals and as scheduled for chronic medical problems.  If anything is abnormal we will treat accordingly and get you in for a follow-up.  Please hold the simvastatin for now.  We will see how you do off of the statin in terms of memory while reassessing labs. . I am going to check on Livalo coverage for you.  Please keep active and eat a well-balanced diet.  Start the Flomax in the evening as directed. Stay well hydrated while one medication. Call me in a week and let me know how you are doing on the medication.  Preventive Care for Adults, Male A healthy lifestyle and preventive care can promote health and wellness. Preventive health guidelines for men include the following key practices:  A routine yearly physical is a good way to check with your health care provider about your health and preventative screening. It is a chance to share any concerns and updates on your health and to receive a thorough exam.  Visit your dentist for a routine exam and preventative care every 6 months. Brush your teeth twice a day and floss once a day. Good oral hygiene prevents tooth decay and gum disease.  The frequency of eye exams is based on your age, health, family medical history, use of contact lenses, and other factors. Follow your health care provider's recommendations for frequency of eye exams.  Eat a healthy diet. Foods such as vegetables, fruits, whole grains, low-fat dairy products, and lean protein foods contain the nutrients you need without too many calories. Decrease your intake of foods high in solid fats, added sugars, and salt. Eat the right amount of calories for you.Get information about a proper diet from your health care provider, if necessary.  Regular physical exercise is one of  the most important things you can do for your health. Most adults should get at least 150 minutes of moderate-intensity exercise (any activity that increases your heart rate and causes you to sweat) each week. In addition, most adults need muscle-strengthening exercises on 2 or more days a week.  Maintain a healthy weight. The body mass index (BMI) is a screening tool to identify possible weight problems. It provides an estimate of body fat based on height and weight. Your health care provider can find your BMI and can help you achieve or maintain a healthy weight.For adults 20 years and older:  A BMI below 18.5 is considered underweight.  A BMI of 18.5 to 24.9 is normal.  A BMI of 25 to 29.9 is considered overweight.  A BMI of 30 and above is considered obese.  Maintain normal blood lipids and cholesterol levels by exercising and minimizing your intake of saturated fat. Eat a balanced diet with plenty of fruit and vegetables. Blood tests for lipids and cholesterol should begin at age 43 and be repeated every 5 years. If your lipid or cholesterol levels are high, you are over 50, or you are at high risk for heart disease, you may need your cholesterol levels checked more frequently.Ongoing high lipid and cholesterol levels should be treated with medicines if diet and exercise are not working.  If you smoke, find out from your health care provider how to quit. If you do not use tobacco, do not start.  Lung  cancer screening is recommended for adults aged 21-80 years who are at high risk for developing lung cancer because of a history of smoking. A yearly low-dose CT scan of the lungs is recommended for people who have at least a 30-pack-year history of smoking and are a current smoker or have quit within the past 15 years. A pack year of smoking is smoking an average of 1 pack of cigarettes a day for 1 year (for example: 1 pack a day for 30 years or 2 packs a day for 15 years). Yearly screening  should continue until the smoker has stopped smoking for at least 15 years. Yearly screening should be stopped for people who develop a health problem that would prevent them from having lung cancer treatment.  If you choose to drink alcohol, do not have more than 2 drinks per day. One drink is considered to be 12 ounces (355 mL) of beer, 5 ounces (148 mL) of wine, or 1.5 ounces (44 mL) of liquor.  Avoid use of street drugs. Do not share needles with anyone. Ask for help if you need support or instructions about stopping the use of drugs.  High blood pressure causes heart disease and increases the risk of stroke. Your blood pressure should be checked at least every 1-2 years. Ongoing high blood pressure should be treated with medicines, if weight loss and exercise are not effective.  If you are 5-51 years old, ask your health care provider if you should take aspirin to prevent heart disease.  Diabetes screening is done by taking a blood sample to check your blood glucose level after you have not eaten for a certain period of time (fasting). If you are not overweight and you do not have risk factors for diabetes, you should be screened once every 3 years starting at age 61. If you are overweight or obese and you are 78-93 years of age, you should be screened for diabetes every year as part of your cardiovascular risk assessment.  Colorectal cancer can be detected and often prevented. Most routine colorectal cancer screening begins at the age of 50 and continues through age 55. However, your health care provider may recommend screening at an earlier age if you have risk factors for colon cancer. On a yearly basis, your health care provider may provide home test kits to check for hidden blood in the stool. Use of a small camera at the end of a tube to directly examine the colon (sigmoidoscopy or colonoscopy) can detect the earliest forms of colorectal cancer. Talk to your health care provider about this at  age 33, when routine screening begins. Direct exam of the colon should be repeated every 5-10 years through age 33, unless early forms of precancerous polyps or small growths are found.  People who are at an increased risk for hepatitis B should be screened for this virus. You are considered at high risk for hepatitis B if:  You were born in a country where hepatitis B occurs often. Talk with your health care provider about which countries are considered high risk.  Your parents were born in a high-risk country and you have not received a shot to protect against hepatitis B (hepatitis B vaccine).  You have HIV or AIDS.  You use needles to inject street drugs.  You live with, or have sex with, someone who has hepatitis B.  You are a man who has sex with other men (MSM).  You get hemodialysis treatment.  You take  certain medicines for conditions such as cancer, organ transplantation, and autoimmune conditions.  Hepatitis C blood testing is recommended for all people born from [birth year] through 1965 and any individual with known risks for hepatitis C.  Practice safe sex. Use condoms and avoid high-risk sexual practices to reduce the spread of sexually transmitted infections (STIs). STIs include gonorrhea, chlamydia, syphilis, trichomonas, herpes, HPV, and human immunodeficiency virus (HIV). Herpes, HIV, and HPV are viral illnesses that have no cure. They can result in disability, cancer, and death.  If you are a man who has sex with other men, you should be screened at least once per year for:  HIV.  Urethral, rectal, and pharyngeal infection of gonorrhea, chlamydia, or both.  If you are at risk of being infected with HIV, it is recommended that you take a prescription medicine daily to prevent HIV infection. This is called preexposure prophylaxis (PrEP). You are considered at risk if:  You are a man who has sex with other men (MSM) and have other risk factors.  You are a heterosexual man,  are sexually active, and are at increased risk for HIV infection.  You take drugs by injection.  You are sexually active with a partner who has HIV.  Talk with your health care provider about whether you are at high risk of being infected with HIV. If you choose to begin PrEP, you should first be tested for HIV. You should then be tested every 3 months for as long as you are taking PrEP.  A one-time screening for abdominal aortic aneurysm (AAA) and surgical repair of large AAAs by ultrasound are recommended for men ages 42 to 70 years who are current or former smokers.  Healthy men should no longer receive prostate-specific antigen (PSA) blood tests as part of routine cancer screening. Talk with your health care provider about prostate cancer screening.  Testicular cancer screening is not recommended for adult males who have no symptoms. Screening includes self-exam, a health care provider exam, and other screening tests. Consult with your health care provider about any symptoms you have or any concerns you have about testicular cancer.  Use sunscreen. Apply sunscreen liberally and repeatedly throughout the day. You should seek shade when your shadow is shorter than you. Protect yourself by wearing long sleeves, pants, a wide-brimmed hat, and sunglasses year round, whenever you are outdoors.  Once a month, do a whole-body skin exam, using a mirror to look at the skin on your back. Tell your health care provider about new moles, moles that have irregular borders, moles that are larger than a pencil eraser, or moles that have changed in shape or color.  Stay current with required vaccines (immunizations).  Influenza vaccine. All adults should be immunized every year.  Tetanus, diphtheria, and acellular pertussis (Td, Tdap) vaccine. An adult who has not previously received Tdap or who does not know his vaccine status should receive 1 dose of Tdap. This initial dose should be followed by tetanus  and diphtheria toxoids (Td) booster doses every 10 years. Adults with an unknown or incomplete history of completing a 3-dose immunization series with Td-containing vaccines should begin or complete a primary immunization series including a Tdap dose. Adults should receive a Td booster every 10 years.  Varicella vaccine. An adult without evidence of immunity to varicella should receive 2 doses or a second dose if he has previously received 1 dose.  Human papillomavirus (HPV) vaccine. Males aged 11-21 years who have not received the vaccine previously  should receive the 3-dose series. Males aged 22-26 years may be immunized. Immunization is recommended through the age of 51 years for any male who has sex with males and did not get any or all doses earlier. Immunization is recommended for any person with an immunocompromised condition through the age of 95 years if he did not get any or all doses earlier. During the 3-dose series, the second dose should be obtained 4-8 weeks after the first dose. The third dose should be obtained 24 weeks after the first dose and 16 weeks after the second dose.  Zoster vaccine. One dose is recommended for adults aged 74 years or older unless certain conditions are present.  Measles, mumps, and rubella (MMR) vaccine. Adults born before [birth year] generally are considered immune to measles and mumps. Adults born in [birth year] or later should have 1 or more doses of MMR vaccine unless there is a contraindication to the vaccine or there is laboratory evidence of immunity to each of the three diseases. A routine second dose of MMR vaccine should be obtained at least 28 days after the first dose for students attending postsecondary schools, health care workers, or international travelers. People who received inactivated measles vaccine or an unknown type of measles vaccine during 1963-1967 should receive 2 doses of MMR vaccine. People who received inactivated mumps vaccine or an unknown type of  mumps vaccine before 1979 and are at high risk for mumps infection should consider immunization with 2 doses of MMR vaccine. Unvaccinated health care workers born before [birth year] who lack laboratory evidence of measles, mumps, or rubella immunity or laboratory confirmation of disease should consider measles and mumps immunization with 2 doses of MMR vaccine or rubella immunization with 1 dose of MMR vaccine.  Pneumococcal 13-valent conjugate (PCV13) vaccine. When indicated, a person who is uncertain of his immunization history and has no record of immunization should receive the PCV13 vaccine. All adults 19 years of age and older should receive this vaccine. An adult aged 61 years or older who has certain medical conditions and has not been previously immunized should receive 1 dose of PCV13 vaccine. This PCV13 should be followed with a dose of pneumococcal polysaccharide (PPSV23) vaccine. Adults who are at high risk for pneumococcal disease should obtain the PPSV23 vaccine at least 8 weeks after the dose of PCV13 vaccine. Adults older than 62 years of age who have normal immune system function should obtain the PPSV23 vaccine dose at least 1 year after the dose of PCV13 vaccine.  Pneumococcal polysaccharide (PPSV23) vaccine. When PCV13 is also indicated, PCV13 should be obtained first. All adults aged 78 years and older should be immunized. An adult younger than age 52 years who has certain medical conditions should be immunized. Any person who resides in a nursing home or long-term care facility should be immunized. An adult smoker should be immunized. People with an immunocompromised condition and certain other conditions should receive both PCV13 and PPSV23 vaccines. People with human immunodeficiency virus (HIV) infection should be immunized as soon as possible after diagnosis. Immunization during chemotherapy or radiation therapy should be avoided. Routine use of PPSV23 vaccine is not recommended for [place]  [place]ians, [place]atives, or people younger than 65 years unless there are medical conditions that require PPSV23 vaccine. When indicated, people who have unknown immunization and have no record of immunization should receive PPSV23 vaccine. One-time revaccination 5 years after the first dose of PPSV23 is recommended for people aged 19-64 years who have chronic kidney  failure, nephrotic syndrome, asplenia, or immunocompromised conditions. People who received 1-2 doses of PPSV23 before age 55 years should receive another dose of PPSV23 vaccine at age 42 years or later if at least 5 years have passed since the previous dose. Doses of PPSV23 are not needed for people immunized with PPSV23 at or after age 23 years.  Meningococcal vaccine. Adults with asplenia or persistent complement component deficiencies should receive 2 doses of quadrivalent meningococcal conjugate (MenACWY-D) vaccine. The doses should be obtained at least 2 months apart. Microbiologists working with certain meningococcal bacteria, [employer], people at risk during an outbreak, and people who travel to or live in countries with a high rate of meningitis should be immunized. A first-year college student up through age 65 years who is living in a residence hall should receive a dose if he did not receive a dose on or after his 16th birthday. Adults who have certain high-risk conditions should receive one or more doses of vaccine.  Hepatitis A vaccine. Adults who wish to be protected from this disease, have chronic liver disease, work with hepatitis A-infected animals, work in hepatitis A research labs, or travel to or work in countries with a high rate of hepatitis A should be immunized. Adults who were previously unvaccinated and who anticipate close contact with an international adoptee during the first 60 days after arrival in the [place] from a country with a high rate of hepatitis A should be immunized.  Hepatitis B vaccine.  Adults should be immunized if they wish to be protected from this disease, are under age 42 years and have diabetes, have chronic liver disease, have had more than one sex partner in the past 6 months, may be exposed to blood or other infectious body fluids, are household contacts or sex partners of hepatitis B positive people, are clients or workers in certain care facilities, or travel to or work in countries with a high rate of hepatitis B.  Haemophilus influenzae type b (Hib) vaccine. A previously unvaccinated person with asplenia or sickle cell disease or having a scheduled splenectomy should receive 1 dose of Hib vaccine. Regardless of previous immunization, a recipient of a hematopoietic stem cell transplant should receive a 3-dose series 6-12 months after his successful transplant. Hib vaccine is not recommended for adults with HIV infection. Preventive Service / Frequency Ages 24 to 6  Blood pressure check.** / Every 3-5 years.  Lipid and cholesterol check.** / Every 5 years beginning at age 43.  Hepatitis C blood test.** / For any individual with known risks for hepatitis C.  Skin self-exam. / Monthly.  Influenza vaccine. / Every year.  Tetanus, diphtheria, and acellular pertussis (Tdap, Td) vaccine.** / Consult your health care provider. 1 dose of Td every 10 years.  Varicella vaccine.** / Consult your health care provider.  HPV vaccine. / 3 doses over 6 months, if 71 or younger.  Measles, mumps, rubella (MMR) vaccine.** / You need at least 1 dose of MMR if you were born in 1957 or later. You may also need a second dose.  Pneumococcal 13-valent conjugate (PCV13) vaccine.** / Consult your health care provider.  Pneumococcal polysaccharide (PPSV23) vaccine.** / 1 to 2 doses if you smoke cigarettes or if you have certain conditions.  Meningococcal vaccine.** / 1 dose if you are age 44 to 41 years and a Market researcher living in a residence hall, or have one of several  medical conditions. You may also need additional booster doses.  Hepatitis A vaccine.** / Consult your health care provider.  Hepatitis B vaccine.** / Consult your health care provider.  Haemophilus influenzae type b (Hib) vaccine.** / Consult your health care provider. Ages 29 to 57  Blood pressure check.** / Every year.  Lipid and cholesterol check.** / Every 5 years beginning at age 64.  Lung cancer screening. / Every year if you are aged 50-80 years and have a 30-pack-year history of smoking and currently smoke or have quit within the past 15 years. Yearly screening is stopped once you have quit smoking for at least 15 years or develop a health problem that would prevent you from having lung cancer treatment.  Fecal occult blood test (FOBT) of stool. / Every year beginning at age 45 and continuing until age 65. You may not have to do this test if you get a colonoscopy every 10 years.  Flexible sigmoidoscopy** or colonoscopy.** / Every 5 years for a flexible sigmoidoscopy or every 10 years for a colonoscopy beginning at age 67 and continuing until age 50.  Hepatitis C blood test.** / For all people born from [birth year] through 1965 and any individual with known risks for hepatitis C.  Skin self-exam. / Monthly.  Influenza vaccine. / Every year.  Tetanus, diphtheria, and acellular pertussis (Tdap/Td) vaccine.** / Consult your health care provider. 1 dose of Td every 10 years.  Varicella vaccine.** / Consult your health care provider.  Zoster vaccine.** / 1 dose for adults aged 60 years or older.  Measles, mumps, rubella (MMR) vaccine.** / You need at least 1 dose of MMR if you were born in 1959 or later. You may also need a second dose.  Pneumococcal 13-valent conjugate (PCV13) vaccine.** / Consult your health care provider.  Pneumococcal polysaccharide (PPSV23) vaccine.** / 1 to 2 doses if you smoke cigarettes or if you have certain conditions.  Meningococcal vaccine.** / Consult  your health care provider.  Hepatitis A vaccine.** / Consult your health care provider.  Hepatitis B vaccine.** / Consult your health care provider.  Haemophilus influenzae type b (Hib) vaccine.** / Consult your health care provider. Ages 19 and over  Blood pressure check.** / Every year.  Lipid and cholesterol check.**/ Every 5 years beginning at age 25.  Lung cancer screening. / Every year if you are aged 90-80 years and have a 30-pack-year history of smoking and currently smoke or have quit within the past 15 years. Yearly screening is stopped once you have quit smoking for at least 15 years or develop a health problem that would prevent you from having lung cancer treatment.  Fecal occult blood test (FOBT) of stool. / Every year beginning at age 26 and continuing until age 36. You may not have to do this test if you get a colonoscopy every 10 years.  Flexible sigmoidoscopy** or colonoscopy.** / Every 5 years for a flexible sigmoidoscopy or every 10 years for a colonoscopy beginning at age 67 and continuing until age 52.  Hepatitis C blood test.** / For all people born from [birth year] through 1967 and any individual with known risks for hepatitis C.  Abdominal aortic aneurysm (AAA) screening.** / A one-time screening for ages 54 to 84 years who are current or former smokers.  Skin self-exam. / Monthly.  Influenza vaccine. / Every year.  Tetanus, diphtheria, and acellular pertussis (Tdap/Td) vaccine.** / 1 dose of Td every 10 years.  Varicella vaccine.** / Consult your health care provider.  Zoster vaccine.** / 1 dose for adults aged 71  years or older.  Pneumococcal 13-valent conjugate (PCV13) vaccine.** / 1 dose for all adults aged 66 years and older.  Pneumococcal polysaccharide (PPSV23) vaccine.** / 1 dose for all adults aged 42 years and older.  Meningococcal vaccine.** / Consult your health care provider.  Hepatitis A vaccine.** / Consult your health care  provider.  Hepatitis B vaccine.** / Consult your health care provider.  Haemophilus influenzae type b (Hib) vaccine.** / Consult your health care provider. **Family history and personal history of risk and conditions may change your health care provider's recommendations.   This information is not intended to replace advice given to you by your health care provider. Make sure you discuss any questions you have with your health care provider.   Document Released: 03/15/2001 Document Revised: 02/07/2014 Document Reviewed: 06/14/2010 Elsevier Interactive Patient Education [employer].

## 2015-10-28 NOTE — Progress Notes (Signed)
Patient presents to clinic today for annual exam.  Patient is fasting for labs.  Acute Concerns: Patient noting continued lower abdominal pressure with urinary hesitancy and nocturia x 3-4. Denies feeling of incomplete bladder emptying. Denies dysuria, hematuria, urinary urgency or frequency. Denies flank pain, nausea or vomiting. Has prior CT abdomen/pelvis which was unremarkable for cause of symptoms. Was to follow-up with GI but has not done so.    Chronic Issues: Hypertension -- Patient is currently on regimen of Metoprolol 50 mg BID and Triamterene-HCTZ 37.5-25 mg daily. Is taking medications as directed. Patient denies chest pain, palpitations, lightheadedness, dizziness, vision changes or frequent headaches.  BP Readings from Last 3 Encounters:  10/28/15 134/84  10/01/15 140/60  04/07/15 136/80   Hyperlipidemia -- Currently on Simvastatin 40 mg daily. Is taking medication as directed. Feels he notes some issue with short-term memory since starting the medication, more so described as fogginess. Denies any change to remote history. Denies history of thyroid abnormality or nutrient deficiency. Has read some articles about simvastatin and memory and would like to discuss other options for treatment.   Anxiety/Depression -- Is currently on a regimen of Wellbutrin XL 300 mg daily. Is taking as directed. Has done quite well on this regimen for some time. Denies breakthrough symptoms. Denies panic attack. Denies SI/HI.   Health Maintenance: TDaP -- up-to-date. Colonoscopy -- Up-to-date. Zostavax -- Patient with prior Varicella immunity status testing. Immunity present.  Flu Shot -- Will have today. Hep C -- Completed.   Past Medical History:  Diagnosis Date  . Bulging lumbar disc   . Colon polyps   . Depression   . GERD (gastroesophageal reflux disease)   . Hyperlipidemia   . Hypertension   . Spinal stenosis     Past Surgical History:  Procedure Laterality Date  . broken  nose   1974  . I&D EXTREMITY Left 12/18/2012   Procedure: I&D Left Middle Finger With Repair Recons.Prn;  Surgeon: Roseanne Kaufman, MD;  Location: [facility];  Service: Orthopedics;  Laterality: Left;  . I&D EXTREMITY Left 12/20/2012   Procedure: LEFT HAND Incision and drainage  TO INCLUDE MIDDLE FINGER FLEXOR TENDON;  Surgeon: Roseanne Kaufman, MD;  Location: [facility];  Service: Orthopedics;  Laterality: Left;  . I&D EXTREMITY Left 12/22/2012   Procedure: IRRIGATION AND DEBRIDEMENT HAND;  Surgeon: Roseanne Kaufman, MD;  Location: [facility];  Service: Orthopedics;  Laterality: Left;  . TYMPANIC MEMBRANE REPAIR    . WISDOM TOOTH EXTRACTION      Current Outpatient Prescriptions on File Prior to Visit  Medication Sig Dispense Refill  . aspirin 81 MG tablet Take 81 mg by mouth daily.      Marland Kitchen buPROPion (WELLBUTRIN XL) 300 MG 24 hr tablet Take 1 tablet (300 mg total) by mouth daily. 90 tablet 1  . metoprolol (LOPRESSOR) 50 MG tablet TAKE ONE TABLET BY MOUTH TWICE DAILY 200 tablet 2  . Multiple Vitamin (MULTI VITAMIN DAILY PO) Take 1 tablet by mouth daily.    Marland Kitchen omeprazole (PRILOSEC) 20 MG capsule Take 1 capsule (20 mg total) by mouth daily. 90 capsule 1  . oxyCODONE (OXY IR/ROXICODONE) 5 MG immediate release tablet Take 1-2 tablets (5-10 mg total) by mouth every 3 (three) hours as needed for moderate pain. 50 tablet 0  . sildenafil (REVATIO) 20 MG tablet Take 1-2 tablets (20-40 mg total) by mouth as needed. 20 tablet 0  . simvastatin (ZOCOR) 40 MG tablet TAKE 1 TABLET (40 MG TOTAL) BY MOUTH  AT BEDTIME. 90 tablet 1  . triamterene-hydrochlorothiazide (MAXZIDE-25) 37.5-25 MG tablet TAKE ONE TABLET BY MOUTH ONE TIME DAILY 90 tablet 1   No current facility-administered medications on file prior to visit.     No Known Allergies  Family History  Problem Relation Age of Onset  . Hypertension Father 19    Deceased  . Neuropathy Father   . Kidney cancer Mother 59    Deceased  . Kidney disease Mother   . Kidney  cancer Maternal Grandmother   . Cancer Other     Aunts & Uncles  . Diabetes Brother   . Breast cancer Sister   . Healthy Brother     x1  . Healthy Daughter     x2    Social History   Social History  . Marital status: Single    Spouse name: N/A  . Number of children: 2  . Years of education: N/A   Occupational History  . CFO    Social History Main Topics  . Smoking status: Former Research scientist (life sciences)  . Smokeless tobacco: Never Used  . Alcohol use 0.0 oz/week     Comment: social  . Drug use: No  . Sexual activity: Yes   Other Topics Concern  . Not on file   Social History Narrative  . No narrative on file   Review of Systems  Constitutional: Positive for malaise/fatigue. Negative for fever and weight loss.  HENT: Negative for ear discharge, ear pain, hearing loss and tinnitus.   Eyes: Negative for blurred vision, double vision, photophobia and pain.  Respiratory: Negative for cough and shortness of breath.   Cardiovascular: Negative for chest pain and palpitations.  Gastrointestinal: Negative for abdominal pain, blood in stool, constipation, diarrhea, heartburn, melena, nausea and vomiting.  Genitourinary: Negative for dysuria, flank pain, frequency, hematuria and urgency.       + urinary hesitancy and suprapubic pressure Nocturia x 3-4 per night.  Musculoskeletal: Negative for falls.  Neurological: Negative for dizziness, loss of consciousness and headaches.  Endo/Heme/Allergies: Negative for environmental allergies.  Psychiatric/Behavioral: Negative for depression, hallucinations, substance abuse and suicidal ideas. The patient is not nervous/anxious and does not have insomnia.    BP 134/84 (BP Location: Right Arm, Patient Position: Sitting, Cuff Size: Large)   Pulse 86   Temp 97.8 F (36.6 C) (Oral)   Resp 16   Ht 5\' 11"  (1.803 m)   Wt 240 lb 2 oz (108.9 kg)   SpO2 97%   BMI 33.49 kg/m   Physical Exam  Constitutional: He is oriented to person, place, and time and  well-developed, well-nourished, and in no distress.  HENT:  Head: Normocephalic and atraumatic.  Right Ear: External ear normal.  Left Ear: External ear normal.  Nose: Nose normal.  Mouth/Throat: Oropharynx is clear and moist. No oropharyngeal exudate.  Eyes: Conjunctivae and EOM are normal. Pupils are equal, round, and reactive to light.  Neck: Neck supple. No thyromegaly present.  Cardiovascular: Normal rate, regular rhythm, normal heart sounds and intact distal pulses.   Pulmonary/Chest: Effort normal and breath sounds normal. No respiratory distress. He has no wheezes. He has no rales. He exhibits no tenderness.  Abdominal: Soft. Bowel sounds are normal. He exhibits no distension and no mass. There is no tenderness. There is no rebound and no guarding.  Genitourinary: Prostate normal, testes/scrotum normal and penis normal.  Lymphadenopathy:    He has no cervical adenopathy.  Neurological: He is alert and oriented to person, place, and time.  Skin: Skin is warm and dry. No rash noted.  Psychiatric: Affect normal.  Vitals reviewed.  Assessment/Plan: Essential hypertension Stable. Will check BMP today. Continue current medication regimen. Will work on activity level. 81 mg ASA daily as directed.  Anxiety and depression Stable. Continue current regimen.  Hyperlipidemia Will take a 2 week break from simvastatin to see if fogginess improves. Discussed other options for medication including Livalo that has a very low side effect profile. Will work on [employer] for [employer]. Dietary and exercise recommendations reviewed.  Prostate cancer screening The natural history of prostate cancer and ongoing controversy regarding screening and potential treatment outcomes of prostate cancer has been discussed with the patient. The meaning of a false positive PSA and a false negative PSA has been discussed. He indicates understanding of the limitations of this screening test and wishes to proceed with  screening PSA testing. DRE without noted prostate nodule or enlargement.   Visit for preventive health examination Depression screen negative. Health Maintenance reviewed -- Immunizations updated today. Colonoscopy and Hep C screen up-to-date. Preventive schedule discussed and handout given in AVS. Will obtain fasting labs today.   Lower urinary tract symptoms (LUTS) Symptoms of BPH present. DRE without palpable enlargement but discussed with patient that I am unable to palpate all lobes of the prostate on exam. Will obtain PSA level. Will begin Flomax. If labs or abnormal or if labs look good but symptoms are not improving, will refer to Urology for further assessment.     Leeanne Rio, PA-C

## 2015-10-29 LAB — CBC
HCT: 42.2 % (ref 39.0–52.0)
Hemoglobin: 14.6 g/dL (ref 13.0–17.0)
MCHC: 34.7 g/dL (ref 30.0–36.0)
MCV: 92.1 fl (ref 78.0–100.0)
PLATELETS: 227 10*3/uL (ref 150.0–400.0)
RBC: 4.58 Mil/uL (ref 4.22–5.81)
RDW: 13.3 % (ref 11.5–15.5)
WBC: 8.5 10*3/uL (ref 4.0–10.5)

## 2015-10-29 LAB — URINALYSIS, ROUTINE W REFLEX MICROSCOPIC
Bilirubin Urine: NEGATIVE
Hgb urine dipstick: NEGATIVE
Ketones, ur: NEGATIVE
Leukocytes, UA: NEGATIVE
Nitrite: NEGATIVE
PH: 6 (ref 5.0–8.0)
RBC / HPF: NONE SEEN (ref 0–?)
Specific Gravity, Urine: 1.02 (ref 1.000–1.030)
TOTAL PROTEIN, URINE-UPE24: NEGATIVE
Urine Glucose: NEGATIVE
Urobilinogen, UA: 0.2 (ref 0.0–1.0)

## 2015-10-29 LAB — LIPID PANEL
CHOL/HDL RATIO: 4
CHOLESTEROL: 199 mg/dL (ref 0–200)
HDL: 48.1 mg/dL (ref >39.00)
LDL Cholesterol: 118 mg/dL — ABNORMAL HIGH (ref 0–99)
NonHDL: 151.33
TRIGLYCERIDES: 167 mg/dL — AB (ref 0.0–149.0)
VLDL: 33.4 mg/dL (ref 0.0–40.0)

## 2015-10-29 LAB — COMPREHENSIVE METABOLIC PANEL
ALT: 28 U/L (ref 0–53)
AST: 17 U/L (ref 0–37)
Albumin: 4.1 g/dL (ref 3.5–5.2)
Alkaline Phosphatase: 78 U/L (ref 39–117)
BUN: 13 mg/dL (ref 6–23)
CALCIUM: 9.3 mg/dL (ref 8.4–10.5)
CO2: 29 mEq/L (ref 19–32)
CREATININE: 0.94 mg/dL (ref 0.40–1.50)
Chloride: 102 mEq/L (ref 96–112)
GFR: 86.43 mL/min (ref >60.00)
Glucose, Bld: 90 mg/dL (ref 70–99)
Potassium: 3.7 mEq/L (ref 3.5–5.1)
SODIUM: 141 meq/L (ref 135–145)
TOTAL PROTEIN: 7 g/dL (ref 6.0–8.3)
Total Bilirubin: 0.6 mg/dL (ref 0.2–1.2)

## 2015-10-29 LAB — TSH: TSH: 1.46 u[IU]/mL (ref 0.35–4.50)

## 2015-10-29 LAB — PSA: PSA: 0.85 ng/mL (ref 0.10–4.00)

## 2015-10-29 LAB — HEMOGLOBIN A1C: Hgb A1c MFr Bld: 6 % (ref 4.6–6.5)

## 2015-10-29 MED ORDER — TAMSULOSIN HCL 0.4 MG PO CAPS: 0.4000 mg | ORAL_CAPSULE | Freq: Every day | ORAL | 3 refills | Status: DC

## 2015-10-30 DIAGNOSIS — Z23 Encounter for immunization: Secondary | ICD-10-CM | POA: Diagnosis not present

## 2015-10-31 DIAGNOSIS — Z125 Encounter for screening for malignant neoplasm of prostate: Secondary | ICD-10-CM | POA: Insufficient documentation

## 2015-10-31 DIAGNOSIS — N400 Enlarged prostate without lower urinary tract symptoms: Secondary | ICD-10-CM | POA: Insufficient documentation

## 2015-10-31 NOTE — Assessment & Plan Note (Signed)
Will take a 2 week break from simvastatin to see if fogginess improves. Discussed other options for medication including Livalo that has a very low side effect profile. Will work on [employer] for [employer]. Dietary and exercise recommendations reviewed.

## 2015-10-31 NOTE — Assessment & Plan Note (Signed)
Stable. Will check BMP today. Continue current medication regimen. Will work on activity level. 81 mg ASA daily as directed.

## 2015-10-31 NOTE — Assessment & Plan Note (Signed)
The natural history of prostate cancer and ongoing controversy regarding screening and potential treatment outcomes of prostate cancer has been discussed with the patient. The meaning of a false positive PSA and a false negative PSA has been discussed. He indicates understanding of the limitations of this screening test and wishes to proceed with screening PSA testing. DRE without noted prostate nodule or enlargement.

## 2015-10-31 NOTE — Assessment & Plan Note (Signed)
Depression screen negative. Health Maintenance reviewed -- Immunizations updated today. Colonoscopy and Hep C screen up-to-date. Preventive schedule discussed and handout given in AVS. Will obtain fasting labs today.

## 2015-10-31 NOTE — Assessment & Plan Note (Signed)
Stable  Continue current regimen  

## 2015-10-31 NOTE — Assessment & Plan Note (Signed)
Symptoms of BPH present. DRE without palpable enlargement but discussed with patient that I am unable to palpate all lobes of the prostate on exam. Will obtain PSA level. Will begin Flomax. If labs or abnormal or if labs look good but symptoms are not improving, will refer to Urology for further assessment.

## 2015-11-03 MED ORDER — TRIAMTERENE-HCTZ 37.5-25 MG PO TABS: 1.0000 | ORAL_TABLET | Freq: Every day | ORAL | 3 refills | Status: DC

## 2015-11-03 MED ORDER — METOPROLOL TARTRATE 50 MG PO TABS: 50.0000 mg | ORAL_TABLET | Freq: Two times a day (BID) | ORAL | 3 refills | Status: DC

## 2015-11-03 MED ORDER — PITAVASTATIN CALCIUM 2 MG PO TABS: 1.0000 | ORAL_TABLET | Freq: Every day | ORAL | 1 refills | Status: DC

## 2015-11-03 MED ORDER — BUPROPION HCL ER (XL) 300 MG PO TB24: 300.0000 mg | ORAL_TABLET | Freq: Every day | ORAL | 1 refills | Status: DC

## 2015-11-03 NOTE — Telephone Encounter (Signed)
Livalo approval received. Will start 2 mg daily. Approval letter sent for scan.

## 2015-11-09 ENCOUNTER — Other Ambulatory Visit: Payer: Self-pay | Admitting: Family Medicine

## 2015-11-24 ENCOUNTER — Encounter: Payer: Self-pay | Admitting: Physician Assistant

## 2015-11-26 ENCOUNTER — Encounter: Payer: Self-pay | Admitting: Physician Assistant

## 2015-11-26 NOTE — Telephone Encounter (Signed)
Message routed to [facility] for review.

## 2015-11-30 ENCOUNTER — Encounter: Payer: Self-pay | Admitting: Physician Assistant

## 2015-11-30 NOTE — Telephone Encounter (Signed)
The dx on these labs have been changed to z00.00 and the claim has been resubmitted to insurance for payment. Patient can disregard bill.

## 2015-12-24 ENCOUNTER — Other Ambulatory Visit: Payer: Self-pay | Admitting: Physician Assistant

## 2016-01-18 ENCOUNTER — Encounter: Payer: Self-pay | Admitting: Physician Assistant

## 2016-01-28 ENCOUNTER — Encounter: Payer: Self-pay | Admitting: Family Medicine

## 2016-01-28 ENCOUNTER — Ambulatory Visit (INDEPENDENT_AMBULATORY_CARE_PROVIDER_SITE_OTHER): Payer: 59 | Admitting: Family Medicine

## 2016-01-28 VITALS — BP 148/74 | HR 73 | Temp 98.2°F | Ht 71.0 in | Wt 250.0 lb

## 2016-01-28 DIAGNOSIS — J0100 Acute maxillary sinusitis, unspecified: Secondary | ICD-10-CM

## 2016-01-28 MED ORDER — DOXYCYCLINE HYCLATE 100 MG PO TABS: 100.0000 mg | ORAL_TABLET | Freq: Two times a day (BID) | ORAL | 0 refills | Status: AC

## 2016-01-28 NOTE — Patient Instructions (Signed)
Wait 2 days with supportive care. If you are still not improving, take the antibiotic. If you get worse, take right away.  Claritin (loratadine), Allegra (fexofenadine), Zyrtec (cetirizine); these are listed in order from weakest to strongest. Generic, and therefore cheaper, options are in the parentheses.   Flonase (fluticasone); nasal spray that is over the counter. 2 sprays each nostril, once daily. Aim towards the same side eye when you spray.  There are available OTC, and the generic versions, which may be cheaper, are in parentheses. Show this to a pharmacist if you have trouble finding any of these items.

## 2016-01-28 NOTE — Progress Notes (Signed)
Chief Complaint  Patient presents with  . Sinus Problem    Antonio Cooper is 60 y.o. male here for possible sinus infection.  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 Bulging lumbar disc   . Colon polyps   . Depression   . GERD (gastroesophageal reflux disease)   . Hyperlipidemia   . Hypertension   . Spinal stenosis    Social History   Social History  . Marital status: Single   Occupational History  . CFO    Social History Main Topics  . Smoking status: Former Research scientist (life sciences)  . Smokeless tobacco: Never Used  . Alcohol use 0.0 oz/week     Comment: social  . Drug use: No  . Sexual activity: Yes   Family History  Problem Relation Age of Onset  . Hypertension Father 21    Deceased  . Neuropathy Father   . Kidney cancer Mother 4    Deceased  . Kidney disease Mother   . Kidney cancer Maternal Grandmother   . Cancer Other     Aunts & Uncles  . Diabetes Brother   . Breast cancer Sister   . Healthy Brother     x1  . Healthy Daughter     x2    BP (!) 148/74   Pulse 73   Temp 98.2 F (36.8 C) (Oral)   Ht 5\' 11"  (1.803 m)   Wt 250 lb (113.4 kg)   SpO2 99%   BMI 34.87 kg/m  Gen- Awake, alert, appears stated age HEENT- Ears patent, TM retracted on R, nml on L, TM's neg b/l, nares are patent, b/l max sinuses are TTP, MMM, pharynx is not erythematous or with exudate Heart- RRR, no murmur, no bruits Lungs- CTAB, no accessory muscle use Psych- Age appropriate judgment and insight, nml mood and affect  Acute non-recurrent maxillary sinusitis - Plan: doxycycline (VIBRA-TABS) 100 MG tablet  Orders as above. Discussed abx use and that >95% of sinus infections are bacterial. As this has been going on for 12  days, will call in abx and have him continue supportive care over the next 2 days. If still no improvement, take abx. He states he does not do well with amoxicillin, so will call in doxy.  BP OK given age.  OTC remedies recommended in AVS. F/u prn. The patient voiced understanding and agreement to the plan.  [place], DO 01/28/16 1:31 PM

## 2016-01-28 NOTE — Progress Notes (Signed)
Pre visit review using our clinic tool,if applicable. No additional management support is needed unless otherwise documented below in the visit note.  

## 2016-02-02 ENCOUNTER — Encounter: Payer: Self-pay | Admitting: Physician Assistant

## 2016-02-02 DIAGNOSIS — E785 Hyperlipidemia, unspecified: Secondary | ICD-10-CM

## 2016-02-23 ENCOUNTER — Other Ambulatory Visit: Payer: Self-pay | Admitting: Physician Assistant

## 2016-02-23 ENCOUNTER — Other Ambulatory Visit: Payer: Self-pay | Admitting: Family Medicine

## 2016-03-23 ENCOUNTER — Other Ambulatory Visit: Payer: Self-pay | Admitting: Physician Assistant

## 2016-04-24 ENCOUNTER — Other Ambulatory Visit: Payer: Self-pay | Admitting: Physician Assistant

## 2016-05-20 ENCOUNTER — Other Ambulatory Visit (INDEPENDENT_AMBULATORY_CARE_PROVIDER_SITE_OTHER): Payer: 59

## 2016-05-20 DIAGNOSIS — E785 Hyperlipidemia, unspecified: Secondary | ICD-10-CM

## 2016-05-20 LAB — COMPREHENSIVE METABOLIC PANEL
ALK PHOS: 76 U/L (ref 39–117)
ALT: 22 U/L (ref 0–53)
AST: 18 U/L (ref 0–37)
Albumin: 4.6 g/dL (ref 3.5–5.2)
BILIRUBIN TOTAL: 0.6 mg/dL (ref 0.2–1.2)
BUN: 13 mg/dL (ref 6–23)
CALCIUM: 9.6 mg/dL (ref 8.4–10.5)
CO2: 30 meq/L (ref 19–32)
CREATININE: 0.94 mg/dL (ref 0.40–1.50)
Chloride: 101 mEq/L (ref 96–112)
GFR: 86.27 mL/min (ref >60.00)
GLUCOSE: 87 mg/dL (ref 70–99)
Potassium: 3.5 mEq/L (ref 3.5–5.1)
Sodium: 140 mEq/L (ref 135–145)
TOTAL PROTEIN: 7.4 g/dL (ref 6.0–8.3)

## 2016-05-20 LAB — LIPID PANEL
CHOL/HDL RATIO: 5
Cholesterol: 203 mg/dL — ABNORMAL HIGH (ref 0–200)
HDL: 43.5 mg/dL (ref >39.00)
LDL Cholesterol: 122 mg/dL — ABNORMAL HIGH (ref 0–99)
NONHDL: 159.95
TRIGLYCERIDES: 190 mg/dL — AB (ref 0.0–149.0)
VLDL: 38 mg/dL (ref 0.0–40.0)

## 2016-05-22 ENCOUNTER — Encounter: Payer: Self-pay | Admitting: Physician Assistant

## 2016-05-25 ENCOUNTER — Other Ambulatory Visit: Payer: Self-pay | Admitting: Physician Assistant

## 2016-05-25 MED ORDER — PITAVASTATIN CALCIUM 4 MG PO TABS: 4.0000 mg | ORAL_TABLET | Freq: Every day | ORAL | 5 refills | Status: DC

## 2016-05-27 ENCOUNTER — Other Ambulatory Visit: Payer: Self-pay | Admitting: Emergency Medicine

## 2016-05-27 MED ORDER — BUPROPION HCL ER (XL) 300 MG PO TB24
300.0000 mg | ORAL_TABLET | Freq: Every day | ORAL | 1 refills | Status: DC
Start: 2016-05-27 — End: 2016-11-29

## 2016-05-27 MED ORDER — TAMSULOSIN HCL 0.4 MG PO CAPS: 0.4000 mg | ORAL_CAPSULE | Freq: Every day | ORAL | 1 refills | Status: DC

## 2016-05-27 MED ORDER — OMEPRAZOLE 20 MG PO CPDR: 20.0000 mg | DELAYED_RELEASE_CAPSULE | Freq: Every day | ORAL | 1 refills | Status: DC

## 2016-10-18 ENCOUNTER — Encounter: Payer: Self-pay | Admitting: Physician Assistant

## 2016-10-31 ENCOUNTER — Encounter: Payer: 59 | Admitting: Physician Assistant

## 2016-10-31 ENCOUNTER — Encounter: Payer: Self-pay | Admitting: Physician Assistant

## 2016-10-31 ENCOUNTER — Ambulatory Visit (INDEPENDENT_AMBULATORY_CARE_PROVIDER_SITE_OTHER): Payer: 59 | Admitting: Physician Assistant

## 2016-10-31 VITALS — BP 118/78 | HR 72 | Temp 98.6°F | Resp 14 | Ht 71.0 in | Wt 236.0 lb

## 2016-10-31 DIAGNOSIS — E7800 Pure hypercholesterolemia, unspecified: Secondary | ICD-10-CM

## 2016-10-31 DIAGNOSIS — F329 Major depressive disorder, single episode, unspecified: Secondary | ICD-10-CM

## 2016-10-31 DIAGNOSIS — F32A Depression, unspecified: Secondary | ICD-10-CM

## 2016-10-31 DIAGNOSIS — F419 Anxiety disorder, unspecified: Secondary | ICD-10-CM | POA: Diagnosis not present

## 2016-10-31 DIAGNOSIS — Z125 Encounter for screening for malignant neoplasm of prostate: Secondary | ICD-10-CM | POA: Diagnosis not present

## 2016-10-31 DIAGNOSIS — I10 Essential (primary) hypertension: Secondary | ICD-10-CM | POA: Diagnosis not present

## 2016-10-31 DIAGNOSIS — N401 Enlarged prostate with lower urinary tract symptoms: Secondary | ICD-10-CM

## 2016-10-31 DIAGNOSIS — Z23 Encounter for immunization: Secondary | ICD-10-CM

## 2016-10-31 DIAGNOSIS — K2100 Gastro-esophageal reflux disease with esophagitis, without bleeding: Secondary | ICD-10-CM

## 2016-10-31 DIAGNOSIS — Z0000 Encounter for general adult medical examination without abnormal findings: Secondary | ICD-10-CM | POA: Diagnosis not present

## 2016-10-31 LAB — COMPREHENSIVE METABOLIC PANEL
ALT: 16 U/L (ref 0–53)
AST: 13 U/L (ref 0–37)
Albumin: 4.2 g/dL (ref 3.5–5.2)
Alkaline Phosphatase: 69 U/L (ref 39–117)
BUN: 18 mg/dL (ref 6–23)
CALCIUM: 9.6 mg/dL (ref 8.4–10.5)
CHLORIDE: 102 meq/L (ref 96–112)
CO2: 30 meq/L (ref 19–32)
Creatinine, Ser: 0.97 mg/dL (ref 0.40–1.50)
GFR: 83.08 mL/min (ref >60.00)
Glucose, Bld: 110 mg/dL — ABNORMAL HIGH (ref 70–99)
Potassium: 3.9 mEq/L (ref 3.5–5.1)
Sodium: 141 mEq/L (ref 135–145)
Total Bilirubin: 0.7 mg/dL (ref 0.2–1.2)
Total Protein: 6.5 g/dL (ref 6.0–8.3)

## 2016-10-31 LAB — LIPID PANEL
CHOLESTEROL: 183 mg/dL (ref 0–200)
HDL: 40.8 mg/dL (ref >39.00)
LDL CALC: 104 mg/dL — AB (ref 0–99)
NonHDL: 142.02
TRIGLYCERIDES: 191 mg/dL — AB (ref 0.0–149.0)
Total CHOL/HDL Ratio: 4
VLDL: 38.2 mg/dL (ref 0.0–40.0)

## 2016-10-31 LAB — PSA: PSA: 0.72 ng/mL (ref 0.10–4.00)

## 2016-10-31 LAB — URINALYSIS, ROUTINE W REFLEX MICROSCOPIC
Bilirubin Urine: NEGATIVE
Hgb urine dipstick: NEGATIVE
Ketones, ur: NEGATIVE
Leukocytes, UA: NEGATIVE
Nitrite: NEGATIVE
RBC / HPF: NONE SEEN (ref 0–?)
SPECIFIC GRAVITY, URINE: 1.025 (ref 1.000–1.030)
Total Protein, Urine: NEGATIVE
URINE GLUCOSE: NEGATIVE
Urobilinogen, UA: 0.2 (ref 0.0–1.0)
WBC, UA: NONE SEEN (ref 0–?)
pH: 6 (ref 5.0–8.0)

## 2016-10-31 LAB — CBC
HEMATOCRIT: 41.8 % (ref 39.0–52.0)
HEMOGLOBIN: 14.3 g/dL (ref 13.0–17.0)
MCHC: 34.1 g/dL (ref 30.0–36.0)
MCV: 95.3 fl (ref 78.0–100.0)
PLATELETS: 204 10*3/uL (ref 150.0–400.0)
RBC: 4.39 Mil/uL (ref 4.22–5.81)
RDW: 13.3 % (ref 11.5–15.5)
WBC: 6 10*3/uL (ref 4.0–10.5)

## 2016-10-31 LAB — HEMOGLOBIN A1C: Hgb A1c MFr Bld: 5.7 % (ref 4.6–6.5)

## 2016-10-31 NOTE — Assessment & Plan Note (Signed)
Doing well. No new symptoms per patient. PSA today. Will continue Flomax.

## 2016-10-31 NOTE — Progress Notes (Signed)
Patient presents to clinic today for CPE. Has been over 1 year since patient's last visit.  Exercise --  Walking each day and made into an exercise regimen. Is also walking with dog.  Diet -- Currently on [employer] for [employer]. Has noted 10 lb weight los so far. Has been doing this regimen for the past several months.   Body mass index is 32.92 kg/m.  Chronic Issues: Hypertension -- Followed by Cardiology. Is currently on Metoprolol 50 mg BID and Maxzide 37.5-25 mg. Is taking as directed. Patient denies chest pain, palpitations, lightheadedness, dizziness, vision changes or frequent headaches.  BP Readings from Last 3 Encounters:  10/31/16 118/78  01/28/16 (!) 148/74  10/28/15 134/84   Hyperlipidemia -- Currently on Livalo 4 mg daily. Is taking as directed. Tolerating well. Diet and exercise as noted above. 81 mg ASA daily.   Health Maintenance: Immunizations -- Tetanus up-to-date. Agrees to flu shot today. Colonoscopy -- Last in 2011. + history of colon polyps but states they moved his recall from every 5 years to 10 years.   Past Medical History:  Diagnosis Date  . Bulging lumbar disc   . Colon polyps   . Depression   . GERD (gastroesophageal reflux disease)   . Hyperlipidemia   . Hypertension   . Spinal stenosis     Past Surgical History:  Procedure Laterality Date  . broken nose   1974  . I&D EXTREMITY Left 12/18/2012   Procedure: I&D Left Middle Finger With Repair Recons.Prn;  Surgeon: Roseanne Kaufman, MD;  Location: [facility];  Service: Orthopedics;  Laterality: Left;  . I&D EXTREMITY Left 12/20/2012   Procedure: LEFT HAND Incision and drainage  TO INCLUDE MIDDLE FINGER FLEXOR TENDON;  Surgeon: Roseanne Kaufman, MD;  Location: [facility];  Service: Orthopedics;  Laterality: Left;  . I&D EXTREMITY Left 12/22/2012   Procedure: IRRIGATION AND DEBRIDEMENT HAND;  Surgeon: Roseanne Kaufman, MD;  Location: [facility];  Service: Orthopedics;  Laterality: Left;  . TYMPANIC MEMBRANE REPAIR    .  WISDOM TOOTH EXTRACTION      Current Outpatient Prescriptions on File Prior to Visit  Medication Sig Dispense Refill  . aspirin 81 MG tablet Take 81 mg by mouth daily.      Marland Kitchen buPROPion (WELLBUTRIN XL) 300 MG 24 hr tablet Take 1 tablet (300 mg total) by mouth daily. 90 tablet 1  . metoprolol (LOPRESSOR) 50 MG tablet TAKE ONE TABLET BY MOUTH TWICE DAILY 200 tablet 4  . Multiple Vitamin (MULTI VITAMIN DAILY PO) Take 1 tablet by mouth daily.    Marland Kitchen omeprazole (PRILOSEC) 20 MG capsule Take 1 capsule (20 mg total) by mouth daily. 90 capsule 1  . Pitavastatin Calcium (LIVALO) 4 MG TABS Take 1 tablet (4 mg total) by mouth daily. 30 tablet 5  . sildenafil (REVATIO) 20 MG tablet TAKE 1 TABLET BY MOUTH 3 TIMES DAILY 10 tablet 10  . tamsulosin (FLOMAX) 0.4 MG CAPS capsule Take 1 capsule (0.4 mg total) by mouth daily. 90 capsule 1  . triamterene-hydrochlorothiazide (MAXZIDE-25) 37.5-25 MG tablet Take 1 tablet by mouth daily. 90 tablet 3   No current facility-administered medications on file prior to visit.     No Known Allergies  Family History  Problem Relation Age of Onset  . Hypertension Father 13       Deceased  . Neuropathy Father   . Kidney cancer Mother 70       Deceased  . Kidney disease Mother   . Kidney cancer  Maternal Grandmother   . Cancer Other        Aunts & Uncles  . Diabetes Brother   . Breast cancer Sister   . Healthy Brother        x1  . Healthy Daughter        x2    Social History   Social History  . Marital status: Single    Spouse name: N/A  . Number of children: 2  . Years of education: N/A   Occupational History  . CFO    Social History Main Topics  . Smoking status: Former Research scientist (life sciences)  . Smokeless tobacco: Never Used  . Alcohol use 0.0 oz/week     Comment: social  . Drug use: No  . Sexual activity: Yes   Other Topics Concern  . Not on file   Social History Narrative  . No narrative on file   Review of Systems  Constitutional: Negative for fever and  weight loss.  HENT: Negative for ear discharge, ear pain, hearing loss and tinnitus.   Eyes: Negative for blurred vision, double vision, photophobia and pain.  Respiratory: Negative for cough and shortness of breath.   Cardiovascular: Negative for chest pain and palpitations.  Gastrointestinal: Negative for abdominal pain, blood in stool, constipation, diarrhea, heartburn, melena, nausea and vomiting.  Genitourinary: Negative for dysuria, flank pain, frequency, hematuria and urgency.  Musculoskeletal: Negative for falls.  Neurological: Negative for dizziness, loss of consciousness and headaches.  Endo/Heme/Allergies: Negative for environmental allergies.  Psychiatric/Behavioral: Negative for depression, hallucinations, substance abuse and suicidal ideas. The patient is not nervous/anxious and does not have insomnia.    BP 118/78   Pulse 72   Temp 98.6 F (37 C) (Oral)   Resp 14   Ht 5\' 11"  (1.803 m)   Wt 236 lb (107 kg)   SpO2 98%   BMI 32.92 kg/m   Physical Exam  Constitutional: He is oriented to person, place, and time and well-developed, well-nourished, and in no distress.  HENT:  Head: Normocephalic and atraumatic.  Right Ear: External ear normal.  Left Ear: External ear normal.  Nose: Nose normal.  Mouth/Throat: Oropharynx is clear and moist. No oropharyngeal exudate.  Eyes: Pupils are equal, round, and reactive to light. Conjunctivae and EOM are normal.  Neck: Neck supple. No thyromegaly present.  Cardiovascular: Normal rate, regular rhythm, normal heart sounds and intact distal pulses.   Pulmonary/Chest: Effort normal and breath sounds normal. No respiratory distress. He has no wheezes. He has no rales. He exhibits no tenderness.  Abdominal: Soft. Bowel sounds are normal. He exhibits no distension and no mass. There is no tenderness. There is no rebound and no guarding.  Genitourinary: Testes/scrotum normal.  Lymphadenopathy:    He has no cervical adenopathy.    Neurological: He is alert and oriented to person, place, and time.  Skin: Skin is warm and dry. No rash noted.  Psychiatric: Affect normal.  Vitals reviewed.  Assessment/Plan: Essential hypertension BP well-controlled. Asymptomatic. Repeat labs today.  GERD Asymptomatic with PPI. Has been on for quite some time. Has been working hard on diet and exercise. Will decreased to QOD dosing for 2 weeks to see how he does. Patient to continue GERD diet.   BPH (benign prostatic hyperplasia) Doing well. No new symptoms per patient. PSA today. Will continue Flomax.  Visit for preventive health examination Depression screen negative. Health Maintenance reviewed -- Colonoscopy recall 2021. Flu shot given today. Tetanus up-to-date. Preventive schedule discussed and handout given in  AVS. Will obtain fasting labs today.   Prostate cancer screening The natural history of prostate cancer and ongoing controversy regarding screening and potential treatment outcomes of prostate cancer has been discussed with the patient. The meaning of a false positive PSA and a false negative PSA has been discussed. He indicates understanding of the limitations of this screening test and wishes  to proceed with screening PSA testing.    Hyperlipidemia Taking Livalo 4 mg daily. Tolerating well without any side effects. Has been working on diet and exercise. Repeat labs today.  Anxiety and depression Stable on Wellbutrin XL 300 mg daily. Continue same.    Leeanne Rio, PA-C

## 2016-10-31 NOTE — Assessment & Plan Note (Signed)
Stable on Wellbutrin XL 300 mg daily. Continue same.

## 2016-10-31 NOTE — Progress Notes (Signed)
Pre visit review using our clinic review tool, if applicable. No additional management support is needed unless otherwise documented below in the visit note. 

## 2016-10-31 NOTE — Patient Instructions (Signed)
Please go to the lab for blood work.   Our office will call you with your results unless you have chosen to receive results via MyChart.  If your blood work is normal we will follow-up each year for physicals and as scheduled for chronic medical problems.  If anything is abnormal we will treat accordingly and get you in for a follow-up.  Apply [facility] or Aspercreme to the neck. Apply heating pad for 10 minutes 2-3 x day. If not improving over the next week, please send me a message.   Preventive Care 40-64 Years, Male Preventive care refers to lifestyle choices and visits with your health care provider that can promote health and wellness. What does preventive care include?  A yearly physical exam. This is also called an annual well check.  Dental exams once or twice a year.  Routine eye exams. Ask your health care provider how often you should have your eyes checked.  Personal lifestyle choices, including: ? Daily care of your teeth and gums. ? Regular physical activity. ? Eating a healthy diet. ? Avoiding tobacco and drug use. ? Limiting alcohol use. ? Practicing safe sex. ? Taking low-dose aspirin every day starting at age 29. What happens during an annual well check? The services and screenings done by your health care provider during your annual well check will depend on your age, overall health, lifestyle risk factors, and family history of disease. Counseling Your health care provider may ask you questions about your:  Alcohol use.  Tobacco use.  Drug use.  Emotional well-being.  Home and relationship well-being.  Sexual activity.  Eating habits.  Work and work Statistician.  Screening You may have the following tests or measurements:  Height, weight, and BMI.  Blood pressure.  Lipid and cholesterol levels. These may be checked every 5 years, or more frequently if you are over 44 years old.  Skin check.  Lung cancer screening. You may have this  screening every year starting at age 33 if you have a 30-pack-year history of smoking and currently smoke or have quit within the past 15 years.  Fecal occult blood test (FOBT) of the stool. You may have this test every year starting at age 43.  Flexible sigmoidoscopy or colonoscopy. You may have a sigmoidoscopy every 5 years or a colonoscopy every 10 years starting at age 46.  Prostate cancer screening. Recommendations will vary depending on your family history and other risks.  Hepatitis C blood test.  Hepatitis B blood test.  Sexually transmitted disease (STD) testing.  Diabetes screening. This is done by checking your blood sugar (glucose) after you have not eaten for a while (fasting). You may have this done every 1-3 years.  Discuss your test results, treatment options, and if necessary, the need for more tests with your health care provider. Vaccines Your health care provider may recommend certain vaccines, such as:  Influenza vaccine. This is recommended every year.  Tetanus, diphtheria, and acellular pertussis (Tdap, Td) vaccine. You may need a Td booster every 10 years.  Varicella vaccine. You may need this if you have not been vaccinated.  Zoster vaccine. You may need this after age 68.  Measles, mumps, and rubella (MMR) vaccine. You may need at least one dose of MMR if you were born in 1957 or later. You may also need a second dose.  Pneumococcal 13-valent conjugate (PCV13) vaccine. You may need this if you have certain conditions and have not been vaccinated.  Pneumococcal polysaccharide (  PPSV23) vaccine. You may need one or two doses if you smoke cigarettes or if you have certain conditions.  Meningococcal vaccine. You may need this if you have certain conditions.  Hepatitis A vaccine. You may need this if you have certain conditions or if you travel or work in places where you may be exposed to hepatitis A.  Hepatitis B vaccine. You may need this if you have  certain conditions or if you travel or work in places where you may be exposed to hepatitis B.  Haemophilus influenzae type b (Hib) vaccine. You may need this if you have certain risk factors.  Talk to your health care provider about which screenings and vaccines you need and how often you need them. This information is not intended to replace advice given to you by your health care provider. Make sure you discuss any questions you have with your health care provider. Document Released: 02/13/2015 Document Revised: 10/07/2015 Document Reviewed: 11/18/2014 Elsevier Interactive Patient Education  2017 [employer]. .

## 2016-10-31 NOTE — Assessment & Plan Note (Signed)
Depression screen negative. Health Maintenance reviewed -- Colonoscopy recall 2021. Flu shot given today. Tetanus up-to-date. Preventive schedule discussed and handout given in AVS. Will obtain fasting labs today.

## 2016-10-31 NOTE — Assessment & Plan Note (Signed)
Taking Livalo 4 mg daily. Tolerating well without any side effects. Has been working on diet and exercise. Repeat labs today.

## 2016-10-31 NOTE — Assessment & Plan Note (Signed)
The natural history of prostate cancer and ongoing controversy regarding screening and potential treatment outcomes of prostate cancer has been discussed with the patient. The meaning of a false positive PSA and a false negative PSA has been discussed. He indicates understanding of the limitations of this screening test and wishes  to proceed with screening PSA testing.  

## 2016-10-31 NOTE — Assessment & Plan Note (Signed)
BP well-controlled. Asymptomatic. Repeat labs today.

## 2016-10-31 NOTE — Assessment & Plan Note (Signed)
Asymptomatic with PPI. Has been on for quite some time. Has been working hard on diet and exercise. Will decreased to QOD dosing for 2 weeks to see how he does. Patient to continue GERD diet.

## 2016-11-01 ENCOUNTER — Encounter: Payer: Self-pay | Admitting: Physician Assistant

## 2016-11-02 ENCOUNTER — Telehealth: Payer: Self-pay | Admitting: Emergency Medicine

## 2016-11-02 NOTE — Telephone Encounter (Signed)
Prior Authorization started on 11/01/16 for Livalo. Insurance approved Livalo until 11/01/17. Notified patient pharmacy.

## 2016-11-21 ENCOUNTER — Encounter: Payer: Self-pay | Admitting: Physician Assistant

## 2016-11-21 DIAGNOSIS — M542 Cervicalgia: Secondary | ICD-10-CM

## 2016-11-22 NOTE — Addendum Note (Signed)
Addended by: Brunetta Jeans on: 11/22/2016 10:46 AM   Modules accepted: Orders

## 2016-11-24 ENCOUNTER — Ambulatory Visit (INDEPENDENT_AMBULATORY_CARE_PROVIDER_SITE_OTHER): Payer: 59 | Admitting: Family Medicine

## 2016-11-24 ENCOUNTER — Encounter: Payer: Self-pay | Admitting: Family Medicine

## 2016-11-24 DIAGNOSIS — M542 Cervicalgia: Secondary | ICD-10-CM

## 2016-11-24 DIAGNOSIS — M546 Pain in thoracic spine: Secondary | ICD-10-CM

## 2016-11-24 DIAGNOSIS — G8929 Other chronic pain: Secondary | ICD-10-CM | POA: Diagnosis not present

## 2016-11-24 NOTE — Progress Notes (Signed)
PCP and consultation requested by: Brunetta Jeans, PA-C  Subjective:   HPI: Patient is a 60 y.o. male here for neck, back pain.  Patient reports he's had about 3-4 months of posterior neck pain. He has tried heat, aspercreme, icing, massage. Massage seemed to worsen this. Daughter is a PT and showed him some exercises to do that he has tried. He also reports he's had several years of pain left side below shoulder blade following an injury. He tried therapy, medications and still has intermittent problems where this will flare up with severe sharp pain for several hours to days. No skin changes, numbness. No radiation of pain. No bowel/bladder dysfunction.  Past Medical History:  Diagnosis Date  . Bulging lumbar disc   . Colon polyps   . Depression   . GERD (gastroesophageal reflux disease)   . Hyperlipidemia   . Hypertension   . Spinal stenosis     Current Outpatient Prescriptions on File Prior to Visit  Medication Sig Dispense Refill  . aspirin 81 MG tablet Take 81 mg by mouth daily.      Marland Kitchen buPROPion (WELLBUTRIN XL) 300 MG 24 hr tablet Take 1 tablet (300 mg total) by mouth daily. 90 tablet 1  . metoprolol (LOPRESSOR) 50 MG tablet TAKE ONE TABLET BY MOUTH TWICE DAILY 200 tablet 4  . Multiple Vitamin (MULTI VITAMIN DAILY PO) Take 1 tablet by mouth daily.    Marland Kitchen omeprazole (PRILOSEC) 20 MG capsule Take 1 capsule (20 mg total) by mouth daily. 90 capsule 1  . Pitavastatin Calcium (LIVALO) 4 MG TABS Take 1 tablet (4 mg total) by mouth daily. 30 tablet 5  . sildenafil (REVATIO) 20 MG tablet TAKE 1 TABLET BY MOUTH 3 TIMES DAILY 10 tablet 10  . tamsulosin (FLOMAX) 0.4 MG CAPS capsule Take 1 capsule (0.4 mg total) by mouth daily. 90 capsule 1  . triamterene-hydrochlorothiazide (MAXZIDE-25) 37.5-25 MG tablet Take 1 tablet by mouth daily. 90 tablet 3   No current facility-administered medications on file prior to visit.     Past Surgical History:  Procedure Laterality Date  .  broken nose   1974  . I&D EXTREMITY Left 12/18/2012   Procedure: I&D Left Middle Finger With Repair Recons.Prn;  Surgeon: Roseanne Kaufman, MD;  Location: [place];  Service: Orthopedics;  Laterality: Left;  . I&D EXTREMITY Left 12/20/2012   Procedure: LEFT HAND Incision and drainage  TO INCLUDE MIDDLE FINGER FLEXOR TENDON;  Surgeon: Roseanne Kaufman, MD;  Location: [place];  Service: Orthopedics;  Laterality: Left;  . I&D EXTREMITY Left 12/22/2012   Procedure: IRRIGATION AND DEBRIDEMENT HAND;  Surgeon: Roseanne Kaufman, MD;  Location: [place];  Service: Orthopedics;  Laterality: Left;  . TYMPANIC MEMBRANE REPAIR    . WISDOM TOOTH EXTRACTION      No Known Allergies  Social History   Social History  . Marital status: Single    Spouse name: N/A  . Number of children: 2  . Years of education: N/A   Occupational History  . CFO    Social History Main Topics  . Smoking status: Former Research scientist (life sciences)  . Smokeless tobacco: Never Used  . Alcohol use 0.0 oz/week     Comment: social  . Drug use: No  . Sexual activity: Yes   Other Topics Concern  . Not on file   Social History Narrative  . No narrative on file    Family History  Problem Relation Age of Onset  . Hypertension Father 29  Deceased  . Neuropathy Father   . Kidney cancer Mother 95       Deceased  . Kidney disease Mother   . Kidney cancer Maternal Grandmother   . Cancer Other        Aunts & Uncles  . Diabetes Brother   . Breast cancer Sister   . Healthy Brother        x1  . Healthy Daughter        x2    BP (!) 163/91   Ht 6' (1.829 m)   Wt 232 lb (105.2 kg)   BMI 31.46 kg/m   Review of Systems: See HPI above.     Objective:  Physical Exam:  Gen: NAD, comfortable in exam room  Neck/Back: No gross deformity, swelling, bruising. No TTP currently (inferior to left scapula states usually a location of pain).  No midline/bony TTP. FROM with mild pain bilateral cervical rotations. BUE strength 5/5.   Sensation  intact to light touch.   2+ equal reflexes in triceps, biceps, brachioradialis tendons. Negative spurlings. NV intact distal BUEs.   Assessment & Plan:  1. Neck pain - consistent with deep cervical strain and likely arthritis.  Ibuprofen 600mg  tid.  Consider prednisone, muscle relaxant, pain medication.  Start physical therapy.  Discussed ergonomic issues.  Heat for spasms.  F/u in 5-6 weeks.  2. Mid-back pain - consistent with spasms/strain of latissimus dorsi.  Try physical therapy with dry needling.  Consider chiropractic care if not helping.

## 2016-11-24 NOTE — Patient Instructions (Signed)
Your neck pain is due to cervical strain and likely mild arthritis. Take ibuprofen 600mg  three times a day with food as needed for pain and inflammation. Consider prednisone dose pack, muscle relaxant, pain medication. Simple range of motion exercises within limits of pain to prevent further stiffness. Start physical therapy for stretching, exercises, traction, and modalities. Heat 15 minutes at a time 3-4 times a day to help with spasms. Watch head position when on computers, texting, when sleeping in bed - should in line with back to prevent further spasms. Consider home traction unit if you get benefit with this in physical therapy. If not improving we will consider imaging Follow up with me in about 5-6 weeks.  Your low thoracic pain is consistent with muscle spasms of the latissimus muscle on the left side. Try physical therapy and dry needling for this. Consider chiropractic care ([facility] [phone] or Martyn Ehrich [phone]) if therapy doesn't seem to be helping.

## 2016-11-27 DIAGNOSIS — M549 Dorsalgia, unspecified: Secondary | ICD-10-CM | POA: Insufficient documentation

## 2016-11-27 DIAGNOSIS — M542 Cervicalgia: Secondary | ICD-10-CM | POA: Insufficient documentation

## 2016-11-27 DIAGNOSIS — G8929 Other chronic pain: Secondary | ICD-10-CM | POA: Insufficient documentation

## 2016-11-27 NOTE — Assessment & Plan Note (Signed)
consistent with spasms/strain of latissimus dorsi.  Try physical therapy with dry needling.  Consider chiropractic care if not helping.

## 2016-11-27 NOTE — Assessment & Plan Note (Signed)
consistent with deep cervical strain and likely arthritis.  Ibuprofen 600mg  tid.  Consider prednisone, muscle relaxant, pain medication.  Start physical therapy.  Discussed ergonomic issues.  Heat for spasms.  F/u in 5-6 weeks.

## 2016-11-29 ENCOUNTER — Other Ambulatory Visit: Payer: Self-pay | Admitting: Physician Assistant

## 2016-12-06 DIAGNOSIS — D224 Melanocytic nevi of scalp and neck: Secondary | ICD-10-CM | POA: Diagnosis not present

## 2016-12-06 DIAGNOSIS — D2261 Melanocytic nevi of right upper limb, including shoulder: Secondary | ICD-10-CM | POA: Diagnosis not present

## 2016-12-06 DIAGNOSIS — D225 Melanocytic nevi of trunk: Secondary | ICD-10-CM | POA: Diagnosis not present

## 2016-12-07 ENCOUNTER — Encounter: Payer: Self-pay | Admitting: Physician Assistant

## 2016-12-10 ENCOUNTER — Encounter: Payer: Self-pay | Admitting: Physician Assistant

## 2016-12-12 MED ORDER — PRAVASTATIN SODIUM 40 MG PO TABS: 40.0000 mg | ORAL_TABLET | Freq: Every day | ORAL | 1 refills | Status: DC

## 2016-12-12 MED ORDER — SILDENAFIL CITRATE 20 MG PO TABS: 20.0000 mg | ORAL_TABLET | Freq: Three times a day (TID) | ORAL | 5 refills | Status: DC

## 2016-12-12 NOTE — Addendum Note (Signed)
Addended by: Brunetta Jeans on: 12/12/2016 08:03 AM   Modules accepted: Orders

## 2016-12-12 NOTE — Addendum Note (Signed)
Addended by: Brunetta Jeans on: 12/12/2016 08:05 AM   Modules accepted: Orders

## 2016-12-14 DIAGNOSIS — H25813 Combined forms of age-related cataract, bilateral: Secondary | ICD-10-CM | POA: Diagnosis not present

## 2016-12-14 DIAGNOSIS — Z9889 Other specified postprocedural states: Secondary | ICD-10-CM | POA: Diagnosis not present

## 2016-12-30 DIAGNOSIS — K029 Dental caries, unspecified: Secondary | ICD-10-CM | POA: Diagnosis not present

## 2016-12-30 DIAGNOSIS — K063 Horizontal alveolar bone loss: Secondary | ICD-10-CM | POA: Diagnosis not present

## 2016-12-30 DIAGNOSIS — K05312 Chronic periodontitis, localized, moderate: Secondary | ICD-10-CM | POA: Diagnosis not present

## 2017-01-01 ENCOUNTER — Encounter: Payer: Self-pay | Admitting: Physician Assistant

## 2017-01-01 MED ORDER — SILDENAFIL CITRATE 20 MG PO TABS: ORAL_TABLET | ORAL | 1 refills | Status: DC

## 2017-01-05 ENCOUNTER — Ambulatory Visit: Payer: 59 | Admitting: Family Medicine

## 2017-02-11 ENCOUNTER — Other Ambulatory Visit: Payer: Self-pay | Admitting: Physician Assistant

## 2017-02-14 ENCOUNTER — Encounter: Payer: Self-pay | Admitting: Emergency Medicine

## 2017-02-14 ENCOUNTER — Other Ambulatory Visit: Payer: Self-pay | Admitting: Physician Assistant

## 2017-02-14 DIAGNOSIS — E7800 Pure hypercholesterolemia, unspecified: Secondary | ICD-10-CM

## 2017-02-14 NOTE — Telephone Encounter (Signed)
My chart message sent to patient to schedule a lab visit to recheck cholesterol.

## 2017-02-15 ENCOUNTER — Encounter: Payer: Self-pay | Admitting: Physician Assistant

## 2017-02-15 DIAGNOSIS — R361 Hematospermia: Secondary | ICD-10-CM

## 2017-02-23 ENCOUNTER — Other Ambulatory Visit: Payer: Self-pay | Admitting: Family Medicine

## 2017-02-24 ENCOUNTER — Encounter: Payer: Self-pay | Admitting: Physician Assistant

## 2017-02-27 ENCOUNTER — Other Ambulatory Visit: Payer: Self-pay | Admitting: Physician Assistant

## 2017-02-27 MED ORDER — BUPROPION HCL ER (XL) 300 MG PO TB24: 300.0000 mg | ORAL_TABLET | Freq: Every day | ORAL | 1 refills | Status: DC

## 2017-03-01 ENCOUNTER — Encounter: Payer: Self-pay | Admitting: Physician Assistant

## 2017-03-02 NOTE — Telephone Encounter (Signed)
Antonio Cooper,   Please see MyChart messages. Can we try to get patient in with another Urology group sooner? [facility] is a good location for patient as well. Please let me know what you can figure out and I will respond to patient. Thank you.

## 2017-03-07 DIAGNOSIS — R350 Frequency of micturition: Secondary | ICD-10-CM | POA: Diagnosis not present

## 2017-03-07 DIAGNOSIS — R361 Hematospermia: Secondary | ICD-10-CM | POA: Diagnosis not present

## 2017-03-07 DIAGNOSIS — N401 Enlarged prostate with lower urinary tract symptoms: Secondary | ICD-10-CM | POA: Diagnosis not present

## 2017-03-09 ENCOUNTER — Encounter: Payer: Self-pay | Admitting: Physician Assistant

## 2017-03-11 ENCOUNTER — Other Ambulatory Visit: Payer: Self-pay | Admitting: Physician Assistant

## 2017-03-13 ENCOUNTER — Encounter: Payer: Self-pay | Admitting: Emergency Medicine

## 2017-04-10 DIAGNOSIS — R361 Hematospermia: Secondary | ICD-10-CM | POA: Diagnosis not present

## 2017-04-11 ENCOUNTER — Encounter: Payer: Self-pay | Admitting: Physician Assistant

## 2017-04-18 ENCOUNTER — Encounter: Payer: Self-pay | Admitting: Physician Assistant

## 2017-04-19 ENCOUNTER — Encounter: Payer: Self-pay | Admitting: Physician Assistant

## 2017-04-19 ENCOUNTER — Other Ambulatory Visit: Payer: Self-pay | Admitting: Physician Assistant

## 2017-04-20 ENCOUNTER — Ambulatory Visit (INDEPENDENT_AMBULATORY_CARE_PROVIDER_SITE_OTHER): Payer: 59 | Admitting: Family Medicine

## 2017-04-20 ENCOUNTER — Encounter: Payer: Self-pay | Admitting: Family Medicine

## 2017-04-20 ENCOUNTER — Other Ambulatory Visit: Payer: Self-pay

## 2017-04-20 ENCOUNTER — Encounter: Payer: Self-pay | Admitting: *Deleted

## 2017-04-20 ENCOUNTER — Encounter: Payer: Self-pay | Admitting: Physician Assistant

## 2017-04-20 VITALS — BP 162/90 | HR 70 | Temp 98.0°F | Ht 72.0 in

## 2017-04-20 DIAGNOSIS — R6889 Other general symptoms and signs: Secondary | ICD-10-CM

## 2017-04-20 DIAGNOSIS — B349 Viral infection, unspecified: Secondary | ICD-10-CM | POA: Diagnosis not present

## 2017-04-20 LAB — POCT INFLUENZA A/B
INFLUENZA B, POC: NEGATIVE
Influenza A, POC: NEGATIVE

## 2017-04-20 NOTE — Progress Notes (Signed)
Subjective   CC:  Chief Complaint  Patient presents with  . Sore Throat    headache, sore throat x 4 days     HPI: Antonio Cooper is a 62 y.o. male who presents to the office today to address the problems listed above in the chief complaint.  Patient complains of flu like symptoms including myalgias, ST, mild cough and some congestion. He has some drainage from left eye without redness or pain. Malaise: had to miss an important work meeting this week because he didn't feel well enough to go.  Sxs have been present for 3-4  days. He has tried to alleviate the sxs with over-the-counter medicines with mild relief. No high risk factors for influenza complications or high risk household contacts are present. No SOB or CP are present. Taking in fluids adequately; decreased appetite bt no significant n/v/d. He has had the flu vaccine this season.  I reviewed the patients updated PMH, FH, and SocHx.    Patient Active Problem List   Diagnosis Date Noted  . Neck pain 11/27/2016  . Back pain 11/27/2016  . Prostate cancer screening 10/31/2015  . BPH (benign prostatic hyperplasia) 10/31/2015  . Visit for preventive health examination 10/13/2014  . ERECTILE DYSFUNCTION 01/04/2007  . Hyperlipidemia 09/27/2006  . Anxiety and depression 09/27/2006  . Essential hypertension 09/27/2006  . GERD 09/27/2006   Current Meds  Medication Sig  . aspirin 81 MG tablet Take 81 mg by mouth daily.    Marland Kitchen buPROPion (WELLBUTRIN XL) 300 MG 24 hr tablet Take 1 tablet (300 mg total) by mouth daily.  . metoprolol (LOPRESSOR) 50 MG tablet TAKE ONE TABLET BY MOUTH TWICE DAILY  . Multiple Vitamin (MULTI VITAMIN DAILY PO) Take 1 tablet by mouth daily.  Marland Kitchen omeprazole (PRILOSEC) 20 MG capsule TAKE 1 CAPSULE BY MOUTH EVERY DAY  . pravastatin (PRAVACHOL) 40 MG tablet TAKE 1 TABLET (40 MG TOTAL) DAILY BY MOUTH.  . sildenafil (REVATIO) 20 MG tablet Take 1-2 tablet by mouth as needed for ED. No more than one dose in 24 hours.    . tamsulosin (FLOMAX) 0.4 MG CAPS capsule TAKE 1 CAPSULE BY MOUTH EVERY DAY  . triamterene-hydrochlorothiazide (MAXZIDE-25) 37.5-25 MG tablet TAKE 1 TABLET BY MOUTH DAILY.   Family History: Patient family history includes Breast cancer in his sister; Cancer in his other; Diabetes in his brother; Healthy in his brother and daughter; Hypertension (age of onset: 39) in his father; Kidney cancer in his maternal grandmother; Kidney cancer (age of onset: 72) in his mother; Kidney disease in his mother; Neuropathy in his father. Social History:  Patient  reports that he has quit smoking. He has never used smokeless tobacco. He reports that he drinks alcohol. He reports that he does not use drugs.  Review of Systems: Constitutional: negative for fever or malaise Ophthalmic: negative for photophobia, double vision or loss of vision Cardiovascular: negative for chest pain, dyspnea on exertion, or new LE swelling Respiratory: negative for SOB or persistent cough Gastrointestinal: negative for abdominal pain, change in bowel habits or melena Genitourinary: negative for dysuria or gross hematuria Musculoskeletal: negative for new gait disturbance or muscular weakness Integumentary: negative for new or persistent rashes Neurological: negative for TIA or stroke symptoms Psychiatric: negative for SI or delusions Allergic/Immunologic: negative for hives  Objective  Vitals: BP (!) 162/90   Pulse 70   Temp 98 F (36.7 C)   Ht 6' (1.829 m)   BMI 31.46 kg/m  General: no acute  respiratory distress  Psych:  Alert and oriented, normal mood and affect HEENT: Normocephalic, left conjunctiva clear with drainage/crusting on lashes, nasal congestion present, TMs w/o erythema, OP with mild erythema w/o exudate, no LAD, supple neck  Cardiovascular:  RRR without murmur or gallop. no peripheral edema Respiratory:  Good breath sounds bilaterally, CTAB with normal respiratory effort Gastrointestinal: soft, flat  abdomen, normal active bowel sounds, no palpable masses, no hepatosplenomegaly, no appreciated hernias Skin:  Warm, no rashes Neurologic:   Mental status is normal. normal gait Office Visit on 04/20/2017  Component Date Value Ref Range Status  . Influenza A, POC 04/20/2017 Negative  Negative Final  . Influenza B, POC 04/20/2017 Negative  Negative Final    Assessment  1. Viral syndrome   2. Flu-like symptoms      Plan   Viral flu like illness; no signs of bacterial infection. Educated. Reassured. contine with nasacort, corcidin bp and add visine for eye discomfort and advil for ST and myalgias.   Follow up: prn    Commons side effects, risks, benefits, and alternatives for medications and treatment plan prescribed today were discussed, and the patient expressed understanding of the given instructions. Patient is instructed to call or message via MyChart if he/she has any questions or concerns regarding our treatment plan. No barriers to understanding were identified. We discussed Red Flag symptoms and signs in detail. Patient expressed understanding regarding what to do in case of urgent or emergency type symptoms.   Medication list was reconciled, printed and provided to the patient in AVS. Patient instructions and summary information was reviewed with the patient as documented in the AVS. This note was prepared with assistance of Dragon voice recognition software. Occasional wrong-word or sound-a-like substitutions may have occurred due to the inherent limitations of voice recognition software  Orders Placed This Encounter  Procedures  . POCT Influenza A/B   No orders of the defined types were placed in this encounter.

## 2017-04-20 NOTE — Patient Instructions (Signed)
Please follow up if symptoms do not improve or as needed.    Viral Illness, Adult Viruses are tiny germs that can get into a person's body and cause illness. There are many different types of viruses, and they cause many types of illness. Viral illnesses can range from mild to severe. They can affect various parts of the body. Common illnesses that are caused by a virus include colds and the flu. Viral illnesses also include serious conditions such as HIV/AIDS (human immunodeficiency virus/acquired immunodeficiency syndrome). A few viruses have been linked to certain cancers. What are the causes? Many types of viruses can cause illness. Viruses invade cells in your body, multiply, and cause the infected cells to malfunction or die. When the cell dies, it releases more of the virus. When this happens, you develop symptoms of the illness, and the virus continues to spread to other cells. If the virus takes over the function of the cell, it can cause the cell to divide and grow out of control, as is the case when a virus causes cancer. Different viruses get into the body in different ways. You can get a virus by:  Swallowing food or water that is contaminated with the virus.  Breathing in droplets that have been coughed or sneezed into the air by an infected person.  Touching a surface that has been contaminated with the virus and then touching your eyes, nose, or mouth.  Being bitten by an insect or animal that carries the virus.  Having sexual contact with a person who is infected with the virus.  Being exposed to blood or fluids that contain the virus, either through an open cut or during a transfusion.  If a virus enters your body, your body's defense system (immune system) will try to fight the virus. You may be at higher risk for a viral illness if your immune system is weak. What are the signs or symptoms? Symptoms vary depending on the type of virus and the location of the cells that it  invades. Common symptoms of the main types of viral illnesses include: Cold and flu viruses  Fever.  Headache.  Sore throat.  Muscle aches.  Nasal congestion.  Cough. Digestive system (gastrointestinal) viruses  Fever.  Abdominal pain.  Nausea.  Diarrhea. Liver viruses (hepatitis)  Loss of appetite.  Tiredness.  Yellowing of the skin (jaundice). Brain and spinal cord viruses  Fever.  Headache.  Stiff neck.  Nausea and vomiting.  Confusion or sleepiness. Skin viruses  Warts.  Itching.  Rash. Sexually transmitted viruses  Discharge.  Swelling.  Redness.  Rash. How is this treated? Viruses can be difficult to treat because they live within cells. Antibiotic medicines do not treat viruses because these drugs do not get inside cells. Treatment for a viral illness may include:  Resting and drinking plenty of fluids.  Medicines to relieve symptoms. These can include over-the-counter medicine for pain and fever, medicines for cough or congestion, and medicines to relieve diarrhea.  Antiviral medicines. These drugs are available only for certain types of viruses. They may help reduce flu symptoms if taken early. There are also many antiviral medicines for hepatitis and HIV/AIDS.  Some viral illnesses can be prevented with vaccinations. A common example is the flu shot. Follow these instructions at home: Medicines   Take over-the-counter and prescription medicines only as told by your health care provider.  If you were prescribed an antiviral medicine, take it as told by your health care provider. Do not   stop taking the medicine even if you start to feel better.  Be aware of when antibiotics are needed and when they are not needed. Antibiotics do not treat viruses. If your health care provider thinks that you may have a bacterial infection as well as a viral infection, you may get an antibiotic. ? Do not ask for an antibiotic prescription if you have  been diagnosed with a viral illness. That will not make your illness go away faster. ? Frequently taking antibiotics when they are not needed can lead to antibiotic resistance. When this develops, the medicine no longer works against the bacteria that it normally fights. General instructions  Drink enough fluids to keep your urine clear or pale yellow.  Rest as much as possible.  Return to your normal activities as told by your health care provider. Ask your health care provider what activities are safe for you.  Keep all follow-up visits as told by your health care provider. This is important. How is this prevented? Take these actions to reduce your risk of viral infection:  Eat a healthy diet and get enough rest.  Wash your hands often with soap and water. This is especially important when you are in public places. If soap and water are not available, use hand sanitizer.  Avoid close contact with friends and family who have a viral illness.  If you travel to areas where viral gastrointestinal infection is common, avoid drinking water or eating raw food.  Keep your immunizations up to date. Get a flu shot every year as told by your health care provider.  Do not share toothbrushes, nail clippers, razors, or needles with other people.  Always practice safe sex.  Contact a health care provider if:  You have symptoms of a viral illness that do not go away.  Your symptoms come back after going away.  Your symptoms get worse. Get help right away if:  You have trouble breathing.  You have a severe headache or a stiff neck.  You have severe vomiting or abdominal pain. This information is not intended to replace advice given to you by your health care provider. Make sure you discuss any questions you have with your health care provider. Document Released: 05/29/2015 Document Revised: 07/01/2015 Document Reviewed: 05/29/2015 Elsevier Interactive Patient Education  2018 [employer]   

## 2017-04-24 ENCOUNTER — Encounter: Payer: Self-pay | Admitting: Physician Assistant

## 2017-04-24 MED ORDER — BENZONATATE 100 MG PO CAPS: 100.0000 mg | ORAL_CAPSULE | Freq: Two times a day (BID) | ORAL | 0 refills | Status: DC | PRN

## 2017-04-24 MED ORDER — AZITHROMYCIN 250 MG PO TABS: ORAL_TABLET | ORAL | 0 refills | Status: DC

## 2017-04-24 MED ORDER — GUAIFENESIN-CODEINE 100-10 MG/5ML PO SOLN: 5.0000 mL | Freq: Four times a day (QID) | ORAL | 0 refills | Status: DC | PRN

## 2017-04-24 NOTE — Telephone Encounter (Signed)
Please call patient: Ordered antibiotic and two cough medications to see if will improve sxs.  Zpak, tessalon perles and robitussin with codeine for nighttime cough.

## 2017-05-01 ENCOUNTER — Other Ambulatory Visit: Payer: Self-pay | Admitting: Family Medicine

## 2017-05-01 ENCOUNTER — Encounter: Payer: Self-pay | Admitting: Physician Assistant

## 2017-05-01 MED ORDER — BENZONATATE 100 MG PO CAPS: 100.0000 mg | ORAL_CAPSULE | Freq: Two times a day (BID) | ORAL | 0 refills | Status: DC | PRN

## 2017-05-01 NOTE — Telephone Encounter (Signed)
Last OV 04/20/17  Both medications filled on 04/24/17 with 0 refills  Please advise if okay to refill?

## 2017-05-01 NOTE — Telephone Encounter (Signed)
I refilled the Tessalon. I refuse to refill the codeine syrup as I have not assessed him.

## 2017-05-01 NOTE — Telephone Encounter (Signed)
Antonio Cooper, Antonio Cooper is requesting more cough meds.  I read his last email message.  I'll let you decide what he needs.   Thanks, [employer]

## 2017-05-04 ENCOUNTER — Other Ambulatory Visit (INDEPENDENT_AMBULATORY_CARE_PROVIDER_SITE_OTHER): Payer: 59

## 2017-05-04 DIAGNOSIS — E7800 Pure hypercholesterolemia, unspecified: Secondary | ICD-10-CM | POA: Diagnosis not present

## 2017-05-04 LAB — LIPID PANEL
CHOL/HDL RATIO: 5
Cholesterol: 172 mg/dL (ref 0–200)
HDL: 37.2 mg/dL — ABNORMAL LOW (ref >39.00)
NonHDL: 134.33
Triglycerides: 218 mg/dL — ABNORMAL HIGH (ref 0.0–149.0)
VLDL: 43.6 mg/dL — ABNORMAL HIGH (ref 0.0–40.0)

## 2017-05-04 LAB — HEPATIC FUNCTION PANEL
ALK PHOS: 75 U/L (ref 39–117)
ALT: 22 U/L (ref 0–53)
AST: 15 U/L (ref 0–37)
Albumin: 3.7 g/dL (ref 3.5–5.2)
BILIRUBIN TOTAL: 0.5 mg/dL (ref 0.2–1.2)
Bilirubin, Direct: 0.1 mg/dL (ref 0.0–0.3)
Total Protein: 6.8 g/dL (ref 6.0–8.3)

## 2017-05-04 LAB — LDL CHOLESTEROL, DIRECT: Direct LDL: 86 mg/dL

## 2017-05-05 ENCOUNTER — Other Ambulatory Visit: Payer: Self-pay | Admitting: Physician Assistant

## 2017-05-21 ENCOUNTER — Other Ambulatory Visit: Payer: Self-pay | Admitting: Physician Assistant

## 2017-06-05 ENCOUNTER — Other Ambulatory Visit: Payer: Self-pay | Admitting: Family Medicine

## 2017-06-05 NOTE — Telephone Encounter (Signed)
This is PA Raiford Noble C, patient,please Advise.

## 2017-06-23 ENCOUNTER — Encounter: Payer: Self-pay | Admitting: Physician Assistant

## 2017-08-17 ENCOUNTER — Other Ambulatory Visit: Payer: Self-pay | Admitting: Physician Assistant

## 2017-10-29 ENCOUNTER — Other Ambulatory Visit: Payer: Self-pay | Admitting: Physician Assistant

## 2017-11-06 ENCOUNTER — Other Ambulatory Visit: Payer: Self-pay

## 2017-11-06 ENCOUNTER — Ambulatory Visit (INDEPENDENT_AMBULATORY_CARE_PROVIDER_SITE_OTHER): Payer: 59 | Admitting: Physician Assistant

## 2017-11-06 ENCOUNTER — Encounter: Payer: Self-pay | Admitting: Physician Assistant

## 2017-11-06 VITALS — BP 132/72 | HR 65 | Temp 98.4°F | Resp 14 | Ht 71.0 in | Wt 227.0 lb

## 2017-11-06 DIAGNOSIS — Z23 Encounter for immunization: Secondary | ICD-10-CM

## 2017-11-06 DIAGNOSIS — F329 Major depressive disorder, single episode, unspecified: Secondary | ICD-10-CM

## 2017-11-06 DIAGNOSIS — Z125 Encounter for screening for malignant neoplasm of prostate: Secondary | ICD-10-CM | POA: Diagnosis not present

## 2017-11-06 DIAGNOSIS — N401 Enlarged prostate with lower urinary tract symptoms: Secondary | ICD-10-CM

## 2017-11-06 DIAGNOSIS — F419 Anxiety disorder, unspecified: Secondary | ICD-10-CM

## 2017-11-06 DIAGNOSIS — E7800 Pure hypercholesterolemia, unspecified: Secondary | ICD-10-CM

## 2017-11-06 DIAGNOSIS — K2100 Gastro-esophageal reflux disease with esophagitis, without bleeding: Secondary | ICD-10-CM

## 2017-11-06 DIAGNOSIS — I10 Essential (primary) hypertension: Secondary | ICD-10-CM

## 2017-11-06 DIAGNOSIS — Z0000 Encounter for general adult medical examination without abnormal findings: Secondary | ICD-10-CM | POA: Diagnosis not present

## 2017-11-06 DIAGNOSIS — F32A Depression, unspecified: Secondary | ICD-10-CM

## 2017-11-06 LAB — COMPREHENSIVE METABOLIC PANEL
ALT: 20 U/L (ref 0–53)
AST: 18 U/L (ref 0–37)
Albumin: 4.3 g/dL (ref 3.5–5.2)
Alkaline Phosphatase: 75 U/L (ref 39–117)
BILIRUBIN TOTAL: 0.8 mg/dL (ref 0.2–1.2)
BUN: 15 mg/dL (ref 6–23)
CALCIUM: 9.5 mg/dL (ref 8.4–10.5)
CO2: 29 mEq/L (ref 19–32)
Chloride: 101 mEq/L (ref 96–112)
Creatinine, Ser: 1.01 mg/dL (ref 0.40–1.50)
GFR: 79.03 mL/min (ref >60.00)
Glucose, Bld: 106 mg/dL — ABNORMAL HIGH (ref 70–99)
POTASSIUM: 3.8 meq/L (ref 3.5–5.1)
Sodium: 139 mEq/L (ref 135–145)
Total Protein: 6.8 g/dL (ref 6.0–8.3)

## 2017-11-06 LAB — LIPID PANEL
Cholesterol: 224 mg/dL — ABNORMAL HIGH (ref 0–200)
HDL: 45.4 mg/dL (ref >39.00)
LDL Cholesterol: 142 mg/dL — ABNORMAL HIGH (ref 0–99)
NonHDL: 179.06
TRIGLYCERIDES: 186 mg/dL — AB (ref 0.0–149.0)
Total CHOL/HDL Ratio: 5
VLDL: 37.2 mg/dL (ref 0.0–40.0)

## 2017-11-06 LAB — CBC WITH DIFFERENTIAL/PLATELET
BASOS ABS: 0.1 10*3/uL (ref 0.0–0.1)
BASOS PCT: 1 % (ref 0.0–3.0)
EOS PCT: 2.2 % (ref 0.0–5.0)
Eosinophils Absolute: 0.1 10*3/uL (ref 0.0–0.7)
HCT: 42.4 % (ref 39.0–52.0)
Hemoglobin: 14.6 g/dL (ref 13.0–17.0)
Lymphocytes Relative: 38 % (ref 12.0–46.0)
Lymphs Abs: 2.4 10*3/uL (ref 0.7–4.0)
MCHC: 34.4 g/dL (ref 30.0–36.0)
MCV: 95.3 fl (ref 78.0–100.0)
Monocytes Absolute: 0.5 10*3/uL (ref 0.1–1.0)
Monocytes Relative: 7.4 % (ref 3.0–12.0)
NEUTROS PCT: 51.4 % (ref 43.0–77.0)
Neutro Abs: 3.2 10*3/uL (ref 1.4–7.7)
Platelets: 205 10*3/uL (ref 150.0–400.0)
RBC: 4.44 Mil/uL (ref 4.22–5.81)
RDW: 13.4 % (ref 11.5–15.5)
WBC: 6.2 10*3/uL (ref 4.0–10.5)

## 2017-11-06 LAB — PSA: PSA: 0.21 ng/mL (ref 0.10–4.00)

## 2017-11-06 LAB — HEMOGLOBIN A1C: Hgb A1c MFr Bld: 5.4 % (ref 4.6–6.5)

## 2017-11-06 NOTE — Patient Instructions (Signed)
Please go to the lab for blood work.   Our office will call you with your results unless you have chosen to receive results via MyChart.  If your blood work is normal we will follow-up each year for physicals and as scheduled for chronic medical problems.  If anything is abnormal we will treat accordingly and get you in for a follow-up.   Preventive Care 40-64 Years, Male Preventive care refers to lifestyle choices and visits with your health care provider that can promote health and wellness. What does preventive care include?  A yearly physical exam. This is also called an annual well check.  Dental exams once or twice a year.  Routine eye exams. Ask your health care provider how often you should have your eyes checked.  Personal lifestyle choices, including: ? Daily care of your teeth and gums. ? Regular physical activity. ? Eating a healthy diet. ? Avoiding tobacco and drug use. ? Limiting alcohol use. ? Practicing safe sex. ? Taking low-dose aspirin every day starting at age 57. What happens during an annual well check? The services and screenings done by your health care provider during your annual well check will depend on your age, overall health, lifestyle risk factors, and family history of disease. Counseling Your health care provider may ask you questions about your:  Alcohol use.  Tobacco use.  Drug use.  Emotional well-being.  Home and relationship well-being.  Sexual activity.  Eating habits.  Work and work Statistician.  Screening You may have the following tests or measurements:  Height, weight, and BMI.  Blood pressure.  Lipid and cholesterol levels. These may be checked every 5 years, or more frequently if you are over 68 years old.  Skin check.  Lung cancer screening. You may have this screening every year starting at age 49 if you have a 30-pack-year history of smoking and currently smoke or have quit within the past 15 years.  Fecal  occult blood test (FOBT) of the stool. You may have this test every year starting at age 65.  Flexible sigmoidoscopy or colonoscopy. You may have a sigmoidoscopy every 5 years or a colonoscopy every 10 years starting at age 56.  Prostate cancer screening. Recommendations will vary depending on your family history and other risks.  Hepatitis C blood test.  Hepatitis B blood test.  Sexually transmitted disease (STD) testing.  Diabetes screening. This is done by checking your blood sugar (glucose) after you have not eaten for a while (fasting). You may have this done every 1-3 years.  Discuss your test results, treatment options, and if necessary, the need for more tests with your health care provider. Vaccines Your health care provider may recommend certain vaccines, such as:  Influenza vaccine. This is recommended every year.  Tetanus, diphtheria, and acellular pertussis (Tdap, Td) vaccine. You may need a Td booster every 10 years.  Varicella vaccine. You may need this if you have not been vaccinated.  Zoster vaccine. You may need this after age 82.  Measles, mumps, and rubella (MMR) vaccine. You may need at least one dose of MMR if you were born in 1957 or later. You may also need a second dose.  Pneumococcal 13-valent conjugate (PCV13) vaccine. You may need this if you have certain conditions and have not been vaccinated.  Pneumococcal polysaccharide (PPSV23) vaccine. You may need one or two doses if you smoke cigarettes or if you have certain conditions.  Meningococcal vaccine. You may need this if you have certain  conditions.  Hepatitis A vaccine. You may need this if you have certain conditions or if you travel or work in places where you may be exposed to hepatitis A.  Hepatitis B vaccine. You may need this if you have certain conditions or if you travel or work in places where you may be exposed to hepatitis B.  Haemophilus influenzae type b (Hib) vaccine. You may need  this if you have certain risk factors.  Talk to your health care provider about which screenings and vaccines you need and how often you need them. This information is not intended to replace advice given to you by your health care provider. Make sure you discuss any questions you have with your health care provider. Document Released: 02/13/2015 Document Revised: 10/07/2015 Document Reviewed: 11/18/2014 Elsevier Interactive Patient Education  2018 Elsevier Inc. .      

## 2017-11-06 NOTE — Assessment & Plan Note (Signed)
The natural history of prostate cancer and ongoing controversy regarding screening and potential treatment outcomes of prostate cancer has been discussed with the patient. The meaning of a false positive PSA and a false negative PSA has been discussed. He indicates understanding of the limitations of this screening test and wishes  to proceed with screening PSA testing.  

## 2017-11-06 NOTE — Assessment & Plan Note (Signed)
BP stable. Asymptomatic. Continue current regimen. Repeat labs today.

## 2017-11-06 NOTE — Assessment & Plan Note (Signed)
Followed by Urology. Asymptomatic with current regimen. Continue care as directed by Urology.

## 2017-11-06 NOTE — Assessment & Plan Note (Signed)
Stable. Medications refilled. Follow-up 6 months.

## 2017-11-06 NOTE — Assessment & Plan Note (Signed)
Well-controlled. Continue diet and exercise. Hopefully will be able to try a wean off of PPI soon. Will monitor.

## 2017-11-06 NOTE — Progress Notes (Signed)
Patient presents to clinic today for annual exam.  Patient is fasting for labs.  Diet -- Is keeping a well-balanced diet and watching portion sizes. Is doing the Noom program.  Exercise -- Currently walking the dog several times per day. Is keeping up and active.   Acute Concerns: Denies acute concerns today.   Chronic Issues: Hypertension -- Is currently on a regimen of Lopressor 50 mg BID and Maxzide 37.5-25mg  daily. [name] taking as directed. Patient denies chest pain, palpitations, lightheadedness, dizziness, vision changes or frequent headaches.   Hyperlipidemia -- Currently on Pravastatin 40 mg daily. Keeping good diet and exercise.   GERD -- On Prilosec OTC PRN. Is doing very well on this regimen. Denies breakthrough symptoms.   Depression -- Currently on a regimen of Wellbutrin XL 300 mg daily. Is taking as directed. Out of medication currently. Denies SI/HI.   Health Maintenance: Immunizations -- Flu shot today. Other immunizations are update.  Colonoscopy -- Up-to-date.   Past Medical History:  Diagnosis Date  . Bulging lumbar disc   . Colon polyps   . Depression   . GERD (gastroesophageal reflux disease)   . Hyperlipidemia   . Hypertension   . Spinal stenosis     Past Surgical History:  Procedure Laterality Date  . broken nose   1974  . I&D EXTREMITY Left 12/18/2012   Procedure: I&D Left Middle Finger With Repair Recons.Prn;  Surgeon: Roseanne Kaufman, MD;  Location: [facility];  Service: Orthopedics;  Laterality: Left;  . I&D EXTREMITY Left 12/20/2012   Procedure: LEFT HAND Incision and drainage  TO INCLUDE MIDDLE FINGER FLEXOR TENDON;  Surgeon: Roseanne Kaufman, MD;  Location: [facility];  Service: Orthopedics;  Laterality: Left;  . I&D EXTREMITY Left 12/22/2012   Procedure: IRRIGATION AND DEBRIDEMENT HAND;  Surgeon: Roseanne Kaufman, MD;  Location: [facility];  Service: Orthopedics;  Laterality: Left;  . TYMPANIC MEMBRANE REPAIR    . WISDOM TOOTH EXTRACTION       Current Outpatient Medications on File Prior to Visit  Medication Sig Dispense Refill  . buPROPion (WELLBUTRIN XL) 300 MG 24 hr tablet Take 1 tablet (300 mg total) by mouth daily. 90 tablet 1  . carboxymethylcellulose 1 % ophthalmic solution 1 drop 2 times daily.    . finasteride (PROSCAR) 5 MG tablet Take 5 mg by mouth daily.  3  . metoprolol tartrate (LOPRESSOR) 50 MG tablet TAKE ONE TABLET BY MOUTH TWICE DAILY 180 tablet 1  . Multiple Vitamin (MULTI VITAMIN DAILY PO) Take 1 tablet by mouth daily.    . Multiple Vitamins-Minerals (MULTIVITAMIN WITH MINERALS) tablet Take by mouth.    Marland Kitchen omeprazole (PRILOSEC) 20 MG capsule TAKE 1 CAPSULE BY MOUTH EVERY DAY 90 capsule 1  . pravastatin (PRAVACHOL) 40 MG tablet TAKE 1 TABLET (40 MG TOTAL) DAILY BY MOUTH. 90 tablet 0  . sildenafil (REVATIO) 20 MG tablet Take 1-2 tablet by mouth as needed for ED. No more than one dose in 24 hours. 30 tablet 1  . tamsulosin (FLOMAX) 0.4 MG CAPS capsule TAKE 1 CAPSULE BY MOUTH EVERY DAY 90 capsule 1  . triamterene-hydrochlorothiazide (MAXZIDE-25) 37.5-25 MG tablet TAKE 1 TABLET BY MOUTH DAILY. 90 tablet 2   No current facility-administered medications on file prior to visit.     No Known Allergies  Family History  Problem Relation Age of Onset  . Hypertension Father 5       Deceased  . Neuropathy Father   . Kidney cancer Mother 68  Deceased  . Kidney disease Mother   . Kidney cancer Maternal Grandmother   . Cancer Other        Aunts & Uncles  . Diabetes Brother   . Breast cancer Sister   . Healthy Brother        x1  . Healthy Daughter        x2    Social History   Socioeconomic History  . Marital status: Single    Spouse name: Not on file  . Number of children: 2  . Years of education: Not on file  . Highest education level: Not on file  Occupational History  . Occupation: CFO  Social Needs  . Financial resource strain: Not on file  . Food insecurity:    Worry: Not on file     Inability: Not on file  . Transportation needs:    Medical: Not on file    Non-medical: Not on file  Tobacco Use  . Smoking status: Former Research scientist (life sciences)  . Smokeless tobacco: Never Used  Substance and Sexual Activity  . Alcohol use: Yes    Alcohol/week: 0.0 standard drinks    Comment: social  . Drug use: No  . Sexual activity: Yes  Lifestyle  . Physical activity:    Days per week: Not on file    Minutes per session: Not on file  . Stress: Not on file  Relationships  . Social connections:    Talks on phone: Not on file    Gets together: Not on file    Attends religious service: Not on file    Active member of club or organization: Not on file    Attends meetings of clubs or organizations: Not on file    Relationship status: Not on file  . Intimate partner violence:    Fear of current or ex partner: Not on file    Emotionally abused: Not on file    Physically abused: Not on file    Forced sexual activity: Not on file  Other Topics Concern  . Not on file  Social History Narrative  . Not on file   Review of Systems  Constitutional: Negative for fever and weight loss.  HENT: Negative for ear discharge, ear pain, hearing loss and tinnitus.   Eyes: Negative for blurred vision, double vision, photophobia and pain.  Respiratory: Negative for cough and shortness of breath.   Cardiovascular: Negative for chest pain and palpitations.  Gastrointestinal: Negative for abdominal pain, blood in stool, constipation, diarrhea, heartburn, melena, nausea and vomiting.  Genitourinary: Negative for dysuria, flank pain, frequency, hematuria and urgency.  Musculoskeletal: Negative for falls.  Neurological: Negative for dizziness, loss of consciousness and headaches.  Endo/Heme/Allergies: Negative for environmental allergies.  Psychiatric/Behavioral: Negative for depression, hallucinations, substance abuse and suicidal ideas. The patient is not nervous/anxious and does not have insomnia.    BP 132/72    Pulse 65   Temp 98.4 F (36.9 C) (Oral)   Resp 14   Ht 5\' 11"  (1.803 m)   Wt 227 lb (103 kg)   SpO2 99%   BMI 31.66 kg/m   Physical Exam  Constitutional: He is oriented to person, place, and time. He appears well-developed and well-nourished. No distress.  HENT:  Head: Normocephalic and atraumatic.  Right Ear: Tympanic membrane, external ear and ear canal normal.  Left Ear: Tympanic membrane, external ear and ear canal normal.  Nose: Nose normal.  Mouth/Throat: Oropharynx is clear and moist and mucous membranes are normal. No posterior  oropharyngeal edema or posterior oropharyngeal erythema.  Eyes: Pupils are equal, round, and reactive to light. Conjunctivae are normal.  Neck: Neck supple. No thyromegaly present.  Cardiovascular: Normal rate, regular rhythm, normal heart sounds and intact distal pulses.  Pulmonary/Chest: Effort normal and breath sounds normal. No respiratory distress. He has no wheezes. He has no rales. He exhibits no tenderness.  Abdominal: Soft. Bowel sounds are normal. He exhibits no distension and no mass. There is no tenderness. There is no rebound and no guarding.  Lymphadenopathy:    He has no cervical adenopathy.  Neurological: He is alert and oriented to person, place, and time. No cranial nerve deficit.  Skin: Skin is warm and dry. No rash noted. He is not diaphoretic.  Psychiatric: He has a normal mood and affect.  Vitals reviewed.  Assessment/Plan: Essential hypertension BP stable. Asymptomatic. Continue current regimen. Repeat labs today.   GERD Well-controlled. Continue diet and exercise. Hopefully will be able to try a wean off of PPI soon. Will monitor.   BPH (benign prostatic hyperplasia) Followed by Urology. Asymptomatic with current regimen. Continue care as directed by Urology.  Anxiety and depression Stable. Medications refilled. Follow-up 6 months.   Prostate cancer screening The natural history of prostate cancer and ongoing  controversy regarding screening and potential treatment outcomes of prostate cancer has been discussed with the patient. The meaning of a false positive PSA and a false negative PSA has been discussed. He indicates understanding of the limitations of this screening test and wishes  to proceed with screening PSA testing.   Hyperlipidemia Repeat fasting labs today.   Visit for preventive health examination Depression screen negative. Health Maintenance reviewed. Flu shot updated. Preventive schedule discussed and handout given in AVS. Will obtain fasting labs today.     Leeanne Rio, PA-C

## 2017-11-06 NOTE — Assessment & Plan Note (Signed)
Depression screen negative. Health Maintenance reviewed. Flu shot updated. Preventive schedule discussed and handout given in AVS. Will obtain fasting labs today.

## 2017-11-06 NOTE — Assessment & Plan Note (Signed)
Repeat fasting labs today. 

## 2017-11-08 ENCOUNTER — Other Ambulatory Visit: Payer: Self-pay | Admitting: Physician Assistant

## 2017-11-08 DIAGNOSIS — E7800 Pure hypercholesterolemia, unspecified: Secondary | ICD-10-CM

## 2017-11-17 ENCOUNTER — Other Ambulatory Visit: Payer: Self-pay | Admitting: Physician Assistant

## 2017-11-20 ENCOUNTER — Other Ambulatory Visit: Payer: Self-pay | Admitting: Physician Assistant

## 2017-11-30 ENCOUNTER — Other Ambulatory Visit: Payer: Self-pay | Admitting: Physician Assistant

## 2017-12-19 ENCOUNTER — Other Ambulatory Visit: Payer: Self-pay | Admitting: Physician Assistant

## 2018-01-11 DIAGNOSIS — D225 Melanocytic nevi of trunk: Secondary | ICD-10-CM | POA: Diagnosis not present

## 2018-01-11 DIAGNOSIS — D224 Melanocytic nevi of scalp and neck: Secondary | ICD-10-CM | POA: Diagnosis not present

## 2018-01-11 DIAGNOSIS — D2261 Melanocytic nevi of right upper limb, including shoulder: Secondary | ICD-10-CM | POA: Diagnosis not present

## 2018-02-19 ENCOUNTER — Other Ambulatory Visit: Payer: Self-pay | Admitting: Physician Assistant

## 2018-05-18 ENCOUNTER — Other Ambulatory Visit: Payer: Self-pay | Admitting: Physician Assistant

## 2018-05-18 ENCOUNTER — Encounter: Payer: Self-pay | Admitting: Emergency Medicine

## 2018-05-18 NOTE — Telephone Encounter (Signed)
Virtual Visit scheduled for 05/22/18.

## 2018-05-21 ENCOUNTER — Other Ambulatory Visit: Payer: Self-pay | Admitting: Physician Assistant

## 2018-05-22 ENCOUNTER — Other Ambulatory Visit: Payer: Self-pay

## 2018-05-22 ENCOUNTER — Ambulatory Visit (INDEPENDENT_AMBULATORY_CARE_PROVIDER_SITE_OTHER): Payer: 59 | Admitting: Physician Assistant

## 2018-05-22 ENCOUNTER — Encounter: Payer: Self-pay | Admitting: Physician Assistant

## 2018-05-22 VITALS — HR 65 | Ht 71.0 in | Wt 230.0 lb

## 2018-05-22 DIAGNOSIS — I10 Essential (primary) hypertension: Secondary | ICD-10-CM | POA: Diagnosis not present

## 2018-05-22 DIAGNOSIS — F329 Major depressive disorder, single episode, unspecified: Secondary | ICD-10-CM

## 2018-05-22 DIAGNOSIS — F419 Anxiety disorder, unspecified: Secondary | ICD-10-CM

## 2018-05-22 DIAGNOSIS — E7800 Pure hypercholesterolemia, unspecified: Secondary | ICD-10-CM

## 2018-05-22 DIAGNOSIS — F32A Depression, unspecified: Secondary | ICD-10-CM

## 2018-05-22 MED ORDER — ALPRAZOLAM 0.25 MG PO TABS: 0.2500 mg | ORAL_TABLET | Freq: Two times a day (BID) | ORAL | 0 refills | Status: DC | PRN

## 2018-05-22 NOTE — Progress Notes (Signed)
I have discussed the procedure for the virtual visit with the patient who has given consent to proceed with assessment and treatment.   Charrisse Masley S Vincenta Steffey, CMA     

## 2018-05-22 NOTE — Assessment & Plan Note (Signed)
Taking statin as directed. Has started new diet with weight loss. Exercise recommendations reviewed with patient. Lab appt scheduled for fasting lipid panel and CMP. He does express interest in going back to [facility] if insurance will pay for it this year. Will check on this and get back to patient.

## 2018-05-22 NOTE — Progress Notes (Signed)
Virtual Visit via Video   I connected with patient on 05/22/18 at 11:00 AM EDT by a video enabled telemedicine application and verified that I am speaking with the correct person using two identifiers.  Location patient: Home Location provider: [facility], Office Persons participating in the virtual visit: Patient, Provider, [place] (Patina Moore)  I discussed the limitations of evaluation and management by telemedicine and the availability of in person appointments. The patient expressed understanding and agreed to proceed.  Subjective:   HPI:   Patient presents via [employer] today for follow-up of hyperlipidemia, hypertension, anxiety/depression and GERD.   Hypertension  -- Patient is currently on a regimen of Lopressor 50 mg BID and Maxzide 37.5-25 mg QD. Endorses taking medications as directed. Notes BP when checked at home looks good. Patient denies chest pain, palpitations, lightheadedness, dizziness, vision changes or frequent headaches.  BP Readings from Last 3 Encounters:  11/06/17 132/72  04/20/17 (!) 162/90  11/24/16 (!) 163/91   Hyperlipidemia  -- Patient is taking his 40 mg Pravastatin daily. Has recently start [employer] plan. Notes that . He has lost 10 pounds thus far. Is increasing exercise due to being at home more. Is doing yard work and going on daily walks over the past couple of weeks. .  Anxiety/Depression  -- Patient is currently on a regimen of Wellbutrin XL 300 mg daily. Endorses taking medications as directed. Has previously been well-controlled on this regimen. Notes mood is stable overall. Denies depressed mood or anhedonia. 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 Is affecting sleep but only minimally. Notes thankfully he is a very positive person. Denies SI/HI.    ROS:   See pertinent positives and negatives per HPI.  Patient Active Problem List   Diagnosis Date Noted  . Neck pain 11/27/2016  . Back pain 11/27/2016  . Prostate cancer screening 10/31/2015  . BPH (benign prostatic hyperplasia) 10/31/2015  . Visit for preventive health examination 10/13/2014  . ERECTILE DYSFUNCTION 01/04/2007  . Hyperlipidemia 09/27/2006  . Anxiety and depression 09/27/2006  . Essential hypertension 09/27/2006  . GERD 09/27/2006    Social History   Tobacco Use  . Smoking status: Former Research scientist (life sciences)  . Smokeless tobacco: Never Used  Substance Use Topics  . Alcohol use: Yes    Alcohol/week: 0.0 standard drinks    Comment: social    Current Outpatient Medications:  .  buPROPion (WELLBUTRIN XL) 300 MG 24 hr tablet, TAKE 1 TABLET BY MOUTH EVERY DAY, Disp: 90 tablet, Rfl: 1 .  carboxymethylcellulose 1 % ophthalmic solution, 1 drop 2 times daily., Disp: , Rfl:  .  finasteride (PROSCAR) 5 MG tablet, Take 5 mg by mouth daily., Disp: , Rfl: 3 .  metoprolol tartrate (LOPRESSOR) 50 MG tablet, TAKE 1 TABLET BY MOUTH TWICE A DAY, Disp: 180 tablet, Rfl: 1 .  Multiple Vitamin (MULTI VITAMIN DAILY PO), Take 1 tablet by mouth daily., Disp: , Rfl:  .  Multiple Vitamins-Minerals (MULTIVITAMIN WITH MINERALS) tablet, Take by mouth., Disp: , Rfl:  .  omeprazole (PRILOSEC) 20 MG capsule, TAKE 1 CAPSULE BY MOUTH EVERY DAY, Disp: 90 capsule, Rfl: 1 .  pravastatin (PRAVACHOL) 40 MG tablet, TAKE 1 TABLET (40 MG TOTAL) DAILY BY MOUTH., Disp: 90 tablet, Rfl: 0 .  sildenafil (REVATIO) 20 MG tablet, Take 1-2 tablet by mouth as needed for ED. No more than one dose in 24 hours., Disp: 30 tablet, Rfl: 1 .  sildenafil (REVATIO) 20 MG tablet, TAKE 1 TABLET (20 MG TOTAL) 3 (THREE) TIMES DAILY BY MOUTH., Disp: 10 tablet, Rfl: 5 .  tamsulosin (FLOMAX) 0.4 MG CAPS capsule, TAKE 1 CAPSULE BY MOUTH EVERY DAY, Disp: 90 capsule, Rfl: 1 .  triamterene-hydrochlorothiazide (MAXZIDE-25) 37.5-25 MG tablet, TAKE 1 TABLET BY MOUTH DAILY., Disp: 90 tablet, Rfl: 2  No Known Allergies  Objective:    Wt 230 lb (104.3 kg)   BMI 32.08 kg/m   Patient is well-developed, well-nourished in no acute distress.  Resting comfortably outside at home.  Head is normocephalic, atraumatic.  No labored breathing.  Speech is clear and coherent with logical contest.  Patient is alert and oriented at baseline.    Assessment and Plan:       Essential hypertension BP has been controlled on this regimen. Endorses compliance with medications. HR stable today. Asymptomatic. Continue current regimen. Lab appointment scheduled for repeat CMP and Lipids. Will assess BP at that time. Continue diet and work on goal of 150 minutes of aerobic activity per week. Will monitor weight.   Anxiety and depression Mood overall stable but increased anxiety due to financial stressors secondary to COVID. Has a very stressful job already and this is only adding to it. Will continue Wellbutrin at current dose. Will start Alprazolam 0.25 mg PRN for acute anxiety. Recommend he download the Calm app and start some of the exercises. Instructions sent to MyChart. He is to keep up updated on how he is doing.   Hyperlipidemia Taking statin as directed. Has started new diet with weight loss. Exercise recommendations reviewed with patient. Lab appt scheduled for fasting lipid panel and CMP. He does express interest in going back to [facility] if insurance will pay for it this year. Will check on this and get back to patient.      Leeanne Rio, PA-C 05/22/2018

## 2018-05-22 NOTE — Patient Instructions (Addendum)
Instructions sent to MyChart.  For Blood Pressure and Cholesterol: - Keep on current medications. When you come in for lab appointment next week, we will recheck BP to make sure this is stable. Make sure to have taken your medication before that visit. Keep up with new diet. Work on a goal of at least 150 minutes of aerobic exercise (things that get heart rate up a bit) per week. I will let you know once I have found out more information on the coverage of [facility] this year.   For Anxiety: - Continue the current regimen of Wellbutrin. Download either the Calm or HeadSpace app on your phone and try out some of the stress-relief and mindfulness exercises there. These are very beneficial for anxiety. The Alprazoalm is to use no more than as directed, only If needed for severe acute anxiety. Let me know how you are doing in the next couple of weeks.   We will schedule true follow-up based on your lab results. Again that appointment is Next Wednesday 05/30/2018 at 10:45 AM. You will pull around to the back of the office for this.   Take care!

## 2018-05-22 NOTE — Assessment & Plan Note (Signed)
Mood overall stable but increased anxiety due to financial stressors secondary to COVID. Has a very stressful job already and this is only adding to it. Will continue Wellbutrin at current dose. Will start Alprazolam 0.25 mg PRN for acute anxiety. Recommend he download the Calm app and start some of the exercises. Instructions sent to MyChart. He is to keep up updated on how he is doing.

## 2018-05-22 NOTE — Assessment & Plan Note (Signed)
BP has been controlled on this regimen. Endorses compliance with medications. HR stable today. Asymptomatic. Continue current regimen. Lab appointment scheduled for repeat CMP and Lipids. Will assess BP at that time. Continue diet and work on goal of 150 minutes of aerobic activity per week. Will monitor weight.

## 2018-05-24 ENCOUNTER — Other Ambulatory Visit: Payer: Self-pay | Admitting: Physician Assistant

## 2018-05-30 ENCOUNTER — Other Ambulatory Visit (INDEPENDENT_AMBULATORY_CARE_PROVIDER_SITE_OTHER): Payer: 59

## 2018-05-30 DIAGNOSIS — E7800 Pure hypercholesterolemia, unspecified: Secondary | ICD-10-CM | POA: Diagnosis not present

## 2018-05-30 DIAGNOSIS — I10 Essential (primary) hypertension: Secondary | ICD-10-CM

## 2018-05-30 LAB — COMPREHENSIVE METABOLIC PANEL WITH GFR
ALT: 23 U/L (ref 0–53)
AST: 17 U/L (ref 0–37)
Albumin: 4.4 g/dL (ref 3.5–5.2)
Alkaline Phosphatase: 82 U/L (ref 39–117)
BUN: 17 mg/dL (ref 6–23)
CO2: 29 meq/L (ref 19–32)
Calcium: 9.4 mg/dL (ref 8.4–10.5)
Chloride: 100 meq/L (ref 96–112)
Creatinine, Ser: 1 mg/dL (ref 0.40–1.50)
GFR: 75.09 mL/min
Glucose, Bld: 106 mg/dL — ABNORMAL HIGH (ref 70–99)
Potassium: 4.3 meq/L (ref 3.5–5.1)
Sodium: 139 meq/L (ref 135–145)
Total Bilirubin: 0.7 mg/dL (ref 0.2–1.2)
Total Protein: 7 g/dL (ref 6.0–8.3)

## 2018-05-30 LAB — LIPID PANEL
Cholesterol: 253 mg/dL — ABNORMAL HIGH (ref 0–200)
HDL: 47.7 mg/dL (ref >39.00)
LDL Cholesterol: 167 mg/dL — ABNORMAL HIGH (ref 0–99)
NonHDL: 204.91
Total CHOL/HDL Ratio: 5
Triglycerides: 190 mg/dL — ABNORMAL HIGH (ref 0.0–149.0)
VLDL: 38 mg/dL (ref 0.0–40.0)

## 2018-06-08 ENCOUNTER — Encounter: Payer: Self-pay | Admitting: Physician Assistant

## 2018-07-26 ENCOUNTER — Encounter: Payer: Self-pay | Admitting: Physician Assistant

## 2018-07-26 DIAGNOSIS — M25562 Pain in left knee: Secondary | ICD-10-CM

## 2018-08-01 ENCOUNTER — Other Ambulatory Visit: Payer: Self-pay | Admitting: Physician Assistant

## 2018-08-01 NOTE — Addendum Note (Signed)
Addended by: Brunetta Jeans on: 08/01/2018 07:25 AM   Modules accepted: Orders

## 2018-08-02 ENCOUNTER — Ambulatory Visit (INDEPENDENT_AMBULATORY_CARE_PROVIDER_SITE_OTHER): Payer: 59 | Admitting: Family Medicine

## 2018-08-02 ENCOUNTER — Encounter: Payer: Self-pay | Admitting: Family Medicine

## 2018-08-02 ENCOUNTER — Ambulatory Visit: Payer: Self-pay

## 2018-08-02 ENCOUNTER — Other Ambulatory Visit: Payer: Self-pay

## 2018-08-02 VITALS — BP 142/86 | HR 101 | Ht 72.0 in | Wt 235.0 lb

## 2018-08-02 DIAGNOSIS — M25561 Pain in right knee: Secondary | ICD-10-CM | POA: Diagnosis not present

## 2018-08-02 MED ORDER — PENNSAID 2 % TD SOLN: 1.0000 "application " | Freq: Two times a day (BID) | TRANSDERMAL | 3 refills | Status: DC

## 2018-08-02 MED ORDER — TRIAMCINOLONE ACETONIDE 40 MG/ML IJ SUSP
40.0000 mg | Freq: Once | INTRAMUSCULAR | Status: AC
  Administered 2018-08-02: 40 mg via INTRA_ARTICULAR

## 2018-08-02 NOTE — Progress Notes (Signed)
Antonio Cooper - 63 y.o. male MRN [id number]  Date of birth: 02-Oct-1955  SUBJECTIVE:  Including CC & ROS.  Chief Complaint  Patient presents with  . Knee Pain    right knee x 2 weeks    BEKIM Cooper is a 63 y.o. male that is presenting with acute right knee pain.  He felt that he may have twisted his knee while he was out mowing.  Since that time he has had this medial joint line pain that is severe.  He is having to use crutches to ambulate at times.  Pain is localized to the knee.  He has had intermittent swelling but the ibuprofen has improved with that.  Denies any mechanical symptoms today.  No history of surgery or trauma to the knee.  Pain can be moderate to severe.  It can be sharp and stabbing..    Review of Systems  Constitutional: Negative for fever.  HENT: Negative for congestion.   Respiratory: Negative for cough.   Cardiovascular: Negative for chest pain.  Gastrointestinal: Negative for abdominal pain.  Musculoskeletal: Positive for gait problem.  Skin: Negative for color change.  Neurological: Negative for weakness.  Hematological: Negative for adenopathy.    HISTORY: Past Medical, Surgical, Social, and Family History Reviewed & Updated per EMR.   Pertinent Historical Findings include:  Past Medical History:  Diagnosis Date  . Bulging lumbar disc   . Colon polyps   . Depression   . GERD (gastroesophageal reflux disease)   . Hyperlipidemia   . Hypertension   . Spinal stenosis     Past Surgical History:  Procedure Laterality Date  . broken nose   1974  . I&D EXTREMITY Left 12/18/2012   Procedure: I&D Left Middle Finger With Repair Recons.Prn;  Surgeon: Roseanne Kaufman, MD;  Location: [place];  Service: Orthopedics;  Laterality: Left;  . I&D EXTREMITY Left 12/20/2012   Procedure: LEFT HAND Incision and drainage  TO INCLUDE MIDDLE FINGER FLEXOR TENDON;  Surgeon: Roseanne Kaufman, MD;  Location: [place];  Service: Orthopedics;  Laterality: Left;  . I&D EXTREMITY  Left 12/22/2012   Procedure: IRRIGATION AND DEBRIDEMENT HAND;  Surgeon: Roseanne Kaufman, MD;  Location: [place];  Service: Orthopedics;  Laterality: Left;  . TYMPANIC MEMBRANE REPAIR    . WISDOM TOOTH EXTRACTION      No Known Allergies  Family History  Problem Relation Age of Onset  . Hypertension Father 32       Deceased  . Neuropathy Father   . Kidney cancer Mother 33       Deceased  . Kidney disease Mother   . Kidney cancer Maternal Grandmother   . Cancer Other        Aunts & Uncles  . Diabetes Brother   . Breast cancer Sister   . Healthy Brother        x1  . Healthy Daughter        x2     Social History   Socioeconomic History  . Marital status: Single    Spouse name: Not on file  . Number of children: 2  . Years of education: Not on file  . Highest education level: Not on file  Occupational History  . Occupation: CFO  Social Needs  . Financial resource strain: Not on file  . Food insecurity    Worry: Not on file    Inability: Not on file  . Transportation needs    Medical: Not on file  Non-medical: Not on file  Tobacco Use  . Smoking status: Former Research scientist (life sciences)  . Smokeless tobacco: Never Used  Substance and Sexual Activity  . Alcohol use: Yes    Alcohol/week: 0.0 standard drinks    Comment: social  . Drug use: No  . Sexual activity: Yes  Lifestyle  . Physical activity    Days per week: Not on file    Minutes per session: Not on file  . Stress: Not on file  Relationships  . Social Herbalist on phone: Not on file    Gets together: Not on file    Attends religious service: Not on file    Active member of club or organization: Not on file    Attends meetings of clubs or organizations: Not on file    Relationship status: Not on file  . Intimate partner violence    Fear of current or ex partner: Not on file    Emotionally abused: Not on file    Physically abused: Not on file    Forced sexual activity: Not on file  Other Topics Concern  .  Not on file  Social History Narrative  . Not on file     PHYSICAL EXAM:  VS: BP (!) 142/86   Pulse (!) 101   Ht 6' (1.829 m)   Wt 235 lb (106.6 kg)   BMI 31.87 kg/m  Physical Exam Gen: NAD, alert, cooperative with exam, well-appearing ENT: normal lips, normal nasal mucosa,  Eye: normal EOM, normal conjunctiva and lids CV:  no edema, +2 pedal pulses   Resp: no accessory muscle use, non-labored,   Skin: no rashes, no areas of induration  Neuro: normal tone, normal sensation to touch Psych:  normal insight, alert and oriented MSK:  Right knee:  No effusion  Normal ROM  Normal strength to resistance  No instability  TTP along the medial joint line.  No pain with patellar grind  Positive McMurray's test  Neurovascularly intact   Limited ultrasound: Right knee:  No effusion in the SPP  Normal appearing QT and PT  Normal appearing lateral joint line  Medial joint line with mild degenerative changes.  Medial meniscus with outpouching and degenerative changes   Summary: Finding suggestive of degenerative meniscal changes and irritation.  Ultrasound and interpretation by Clearance Coots, MD    Aspiration/Injection Procedure Note Lauretta Grill 07-27-1955  Procedure: Injection Indications: Right knee pain  Procedure Details Consent: Risks of procedure as well as the alternatives and risks of each were explained to the (patient/caregiver).  Consent for procedure obtained. Time Out: Verified patient identification, verified procedure, site/side was marked, verified correct patient position, special equipment/implants available, medications/allergies/relevent history reviewed, required imaging and test results available.  Performed.  The area was cleaned with iodine and alcohol swabs.    The right knee superior lateral suprapatellar pouch was injected using 1 cc's of 40 mg Kenalog and 4 cc's of 0.25 % bupivacaine with a 22 1 1/2" needle.  Ultrasound was used. Images were  obtained in long views showing the injection.     A sterile dressing was applied.  Patient did tolerate procedure well.     ASSESSMENT & PLAN:   Acute pain of right knee Pain seems to be related to degenerative meniscal changes.  Has mild degenerative joint line changes.  No instability or mechanical symptoms. -Injection. -Counseled on home exercise therapy and supportive care. -Pennsaid. -If no improvement will consider x-ray or physical therapy.

## 2018-08-02 NOTE — Progress Notes (Signed)
Medication Samples have been provided to the patient.  Drug name: Pennsaid      Strength: 2%        Qty: 1 Box LOT: [id number]  Exp.Date: 03/2019  Dosing instructions: Use a peasize amount and rub qently.  The patient has been instructed regarding the correct time, dose, and frequency of taking this medication, including desired effects and most common side effects.   Sherrie George, MA 1:11 PM 08/02/2018

## 2018-08-02 NOTE — Patient Instructions (Addendum)
Nice to meet you Please try the exercises  Please try ice  Please wear the brace for the next couple of weeks.  Try to avoid maneuvers that cause pain.   Please send me a message in MyChart with any questions or updates.  Please see me back in 4 weeks.   --Dr. Raeford Razor

## 2018-08-02 NOTE — Assessment & Plan Note (Signed)
Pain seems to be related to degenerative meniscal changes.  Has mild degenerative joint line changes.  No instability or mechanical symptoms. -Injection. -Counseled on home exercise therapy and supportive care. -Pennsaid. -If no improvement will consider x-ray or physical therapy.

## 2018-08-05 ENCOUNTER — Encounter: Payer: Self-pay | Admitting: Family Medicine

## 2018-08-07 ENCOUNTER — Other Ambulatory Visit: Payer: Self-pay | Admitting: Family Medicine

## 2018-08-07 MED ORDER — PREDNISONE 5 MG PO TABS: ORAL_TABLET | ORAL | 0 refills | Status: DC

## 2018-08-16 ENCOUNTER — Other Ambulatory Visit: Payer: Self-pay | Admitting: Physician Assistant

## 2018-08-16 ENCOUNTER — Other Ambulatory Visit: Payer: Self-pay | Admitting: Emergency Medicine

## 2018-08-16 DIAGNOSIS — E7800 Pure hypercholesterolemia, unspecified: Secondary | ICD-10-CM

## 2018-09-05 ENCOUNTER — Encounter: Payer: Self-pay | Admitting: Family Medicine

## 2018-09-05 ENCOUNTER — Ambulatory Visit (HOSPITAL_BASED_OUTPATIENT_CLINIC_OR_DEPARTMENT_OTHER)
Admission: RE | Admit: 2018-09-05 | Discharge: 2018-09-05 | Disposition: A | Discharge status: Home / Self Care | Payer: 59 | Source: Ambulatory Visit | Attending: Family Medicine | Admitting: Family Medicine

## 2018-09-05 ENCOUNTER — Other Ambulatory Visit: Payer: Self-pay

## 2018-09-05 ENCOUNTER — Ambulatory Visit (INDEPENDENT_AMBULATORY_CARE_PROVIDER_SITE_OTHER): Payer: 59 | Admitting: Family Medicine

## 2018-09-05 VITALS — BP 129/75 | HR 72 | Ht 72.0 in | Wt 235.0 lb

## 2018-09-05 DIAGNOSIS — M25561 Pain in right knee: Secondary | ICD-10-CM | POA: Insufficient documentation

## 2018-09-05 NOTE — Patient Instructions (Signed)
Good to see you Please try the compression  Please try the exercises  Please try ice  Tylenol can help   I will call you with the results from today Please send me a message in MyChart with any questions or updates.  Please see me back in 4 weeks if the pain hasn't improved.   --Dr. Raeford Razor

## 2018-09-05 NOTE — Assessment & Plan Note (Signed)
Pain likely related to degenerative changes.  Pain today seems more arthritic in nature. -X-ray. -Counseled on compression -Counseled on home exercise therapy and supportive care. -If no improvement will consider gel injections or physical therapy.

## 2018-09-05 NOTE — Progress Notes (Signed)
Antonio Cooper - 63 y.o. male MRN [id number]  Date of birth: 1955-12-12  SUBJECTIVE:  Including CC & ROS.  Chief Complaint  Patient presents with  . Follow-up    follow up for right knee    [name] is a 63 y.o. male that is following up for his right knee pain.  He received an intra-articular injection with limited improvement.  He also received a course of oral steroids.  He still has intermittent mild medial joint space pain from time to time.  It is worse when it is in mild flexion.  He has pain when he drives for long distances.  Is localized to the knee.  Has some pain when he is trying to do yard work.  Pain is mild to moderate.  It is throbbing in nature.   Review of Systems  Constitutional: Negative for fever.  HENT: Negative for congestion.   Respiratory: Negative for cough.   Cardiovascular: Negative for chest pain.  Gastrointestinal: Negative for abdominal pain.  Musculoskeletal: Positive for gait problem.  Skin: Negative for color change.  Neurological: Negative for weakness.  Hematological: Negative for adenopathy.    HISTORY: Past Medical, Surgical, Social, and Family History Reviewed & Updated per EMR.   Pertinent Historical Findings include:  Past Medical History:  Diagnosis Date  . Bulging lumbar disc   . Colon polyps   . Depression   . GERD (gastroesophageal reflux disease)   . Hyperlipidemia   . Hypertension   . Spinal stenosis     Past Surgical History:  Procedure Laterality Date  . broken nose   1974  . I&D EXTREMITY Left 12/18/2012   Procedure: I&D Left Middle Finger With Repair Recons.Prn;  Surgeon: Roseanne Kaufman, MD;  Location: [place];  Service: Orthopedics;  Laterality: Left;  . I&D EXTREMITY Left 12/20/2012   Procedure: LEFT HAND Incision and drainage  TO INCLUDE MIDDLE FINGER FLEXOR TENDON;  Surgeon: Roseanne Kaufman, MD;  Location: [place];  Service: Orthopedics;  Laterality: Left;  . I&D EXTREMITY Left 12/22/2012   Procedure: IRRIGATION  AND DEBRIDEMENT HAND;  Surgeon: Roseanne Kaufman, MD;  Location: [place];  Service: Orthopedics;  Laterality: Left;  . TYMPANIC MEMBRANE REPAIR    . WISDOM TOOTH EXTRACTION      No Known Allergies  Family History  Problem Relation Age of Onset  . Hypertension Father 76       Deceased  . Neuropathy Father   . Kidney cancer Mother 64       Deceased  . Kidney disease Mother   . Kidney cancer Maternal Grandmother   . Cancer Other        Aunts & Uncles  . Diabetes Brother   . Breast cancer Sister   . Healthy Brother        x1  . Healthy Daughter        x2     Social History   Socioeconomic History  . Marital status: Single    Spouse name: Not on file  . Number of children: 2  . Years of education: Not on file  . Highest education level: Not on file  Occupational History  . Occupation: CFO  Social Needs  . Financial resource strain: Not on file  . Food insecurity    Worry: Not on file    Inability: Not on file  . Transportation needs    Medical: Not on file    Non-medical: Not on file  Tobacco Use  . Smoking  status: Former Research scientist (life sciences)  . Smokeless tobacco: Never Used  Substance and Sexual Activity  . Alcohol use: Yes    Alcohol/week: 0.0 standard drinks    Comment: social  . Drug use: No  . Sexual activity: Yes  Lifestyle  . Physical activity    Days per week: Not on file    Minutes per session: Not on file  . Stress: Not on file  Relationships  . Social Herbalist on phone: Not on file    Gets together: Not on file    Attends religious service: Not on file    Active member of club or organization: Not on file    Attends meetings of clubs or organizations: Not on file    Relationship status: Not on file  . Intimate partner violence    Fear of current or ex partner: Not on file    Emotionally abused: Not on file    Physically abused: Not on file    Forced sexual activity: Not on file  Other Topics Concern  . Not on file  Social History Narrative  .  Not on file     PHYSICAL EXAM:  VS: BP 129/75   Pulse 72   Ht 6' (1.829 m)   Wt 235 lb (106.6 kg)   BMI 31.87 kg/m  Physical Exam Gen: NAD, alert, cooperative with exam, well-appearing ENT: normal lips, normal nasal mucosa,  Eye: normal EOM, normal conjunctiva and lids CV:  no edema, +2 pedal pulses   Resp: no accessory muscle use, non-labored,  Skin: no rashes, no areas of induration  Neuro: normal tone, normal sensation to touch Psych:  normal insight, alert and oriented MSK:  Right knee: No obvious effusion. Normal range of motion. Normal strength resistance. Negative McMurray's test. No pain with patellar grind. Neurovascular intact     ASSESSMENT & PLAN:   Acute pain of right knee Pain likely related to degenerative changes.  Pain today seems more arthritic in nature. -X-ray. -Counseled on compression -Counseled on home exercise therapy and supportive care. -If no improvement will consider gel injections or physical therapy.

## 2018-09-06 ENCOUNTER — Telehealth: Payer: Self-pay | Admitting: Family Medicine

## 2018-09-06 ENCOUNTER — Encounter: Payer: Self-pay | Admitting: Family Medicine

## 2018-09-06 NOTE — Telephone Encounter (Signed)
Left VM for patient. If he calls back please have him speak with a nurse/CMA and inform that his xrays shows mild degenerative changes on the inside of the knee.   If any questions then please take the best time and phone number to call and I will try to call him back.   Rosemarie Ax, MD [facility] 09/06/2018, 8:44 AM

## 2018-09-07 ENCOUNTER — Encounter: Payer: Self-pay | Admitting: Family Medicine

## 2018-11-09 ENCOUNTER — Other Ambulatory Visit: Payer: Self-pay

## 2018-11-09 ENCOUNTER — Encounter: Payer: Self-pay | Admitting: Physician Assistant

## 2018-11-09 ENCOUNTER — Ambulatory Visit (INDEPENDENT_AMBULATORY_CARE_PROVIDER_SITE_OTHER): Payer: 59 | Admitting: Physician Assistant

## 2018-11-09 VITALS — BP 118/70 | HR 64 | Temp 98.2°F | Resp 16 | Ht 71.0 in | Wt 242.0 lb

## 2018-11-09 DIAGNOSIS — Z23 Encounter for immunization: Secondary | ICD-10-CM | POA: Diagnosis not present

## 2018-11-09 DIAGNOSIS — I10 Essential (primary) hypertension: Secondary | ICD-10-CM | POA: Diagnosis not present

## 2018-11-09 DIAGNOSIS — Z125 Encounter for screening for malignant neoplasm of prostate: Secondary | ICD-10-CM

## 2018-11-09 DIAGNOSIS — Z0000 Encounter for general adult medical examination without abnormal findings: Secondary | ICD-10-CM | POA: Diagnosis not present

## 2018-11-09 DIAGNOSIS — F329 Major depressive disorder, single episode, unspecified: Secondary | ICD-10-CM

## 2018-11-09 DIAGNOSIS — N401 Enlarged prostate with lower urinary tract symptoms: Secondary | ICD-10-CM

## 2018-11-09 DIAGNOSIS — F419 Anxiety disorder, unspecified: Secondary | ICD-10-CM

## 2018-11-09 DIAGNOSIS — L989 Disorder of the skin and subcutaneous tissue, unspecified: Secondary | ICD-10-CM

## 2018-11-09 DIAGNOSIS — E7800 Pure hypercholesterolemia, unspecified: Secondary | ICD-10-CM

## 2018-11-09 DIAGNOSIS — H9193 Unspecified hearing loss, bilateral: Secondary | ICD-10-CM | POA: Insufficient documentation

## 2018-11-09 DIAGNOSIS — F32A Depression, unspecified: Secondary | ICD-10-CM

## 2018-11-09 LAB — CBC WITH DIFFERENTIAL/PLATELET
Basophils Absolute: 0.1 10*3/uL (ref 0.0–0.1)
Basophils Relative: 1.4 % (ref 0.0–3.0)
Eosinophils Absolute: 0.4 10*3/uL (ref 0.0–0.7)
Eosinophils Relative: 5.2 % — ABNORMAL HIGH (ref 0.0–5.0)
HCT: 44.1 % (ref 39.0–52.0)
Hemoglobin: 14.8 g/dL (ref 13.0–17.0)
Lymphocytes Relative: 38.8 % (ref 12.0–46.0)
Lymphs Abs: 2.9 10*3/uL (ref 0.7–4.0)
MCHC: 33.6 g/dL (ref 30.0–36.0)
MCV: 95.7 fl (ref 78.0–100.0)
Monocytes Absolute: 0.5 10*3/uL (ref 0.1–1.0)
Monocytes Relative: 6.3 % (ref 3.0–12.0)
Neutro Abs: 3.6 10*3/uL (ref 1.4–7.7)
Neutrophils Relative %: 48.3 % (ref 43.0–77.0)
Platelets: 221 10*3/uL (ref 150.0–400.0)
RBC: 4.61 Mil/uL (ref 4.22–5.81)
RDW: 13.2 % (ref 11.5–15.5)
WBC: 7.5 10*3/uL (ref 4.0–10.5)

## 2018-11-09 LAB — COMPREHENSIVE METABOLIC PANEL
ALT: 22 U/L (ref 0–53)
AST: 16 U/L (ref 0–37)
Albumin: 4.4 g/dL (ref 3.5–5.2)
Alkaline Phosphatase: 73 U/L (ref 39–117)
BUN: 16 mg/dL (ref 6–23)
CO2: 31 mEq/L (ref 19–32)
Calcium: 9.8 mg/dL (ref 8.4–10.5)
Chloride: 102 mEq/L (ref 96–112)
Creatinine, Ser: 1 mg/dL (ref 0.40–1.50)
GFR: 74.98 mL/min (ref >60.00)
Glucose, Bld: 99 mg/dL (ref 70–99)
Potassium: 4.1 mEq/L (ref 3.5–5.1)
Sodium: 140 mEq/L (ref 135–145)
Total Bilirubin: 0.8 mg/dL (ref 0.2–1.2)
Total Protein: 6.9 g/dL (ref 6.0–8.3)

## 2018-11-09 LAB — LIPID PANEL
Cholesterol: 219 mg/dL — ABNORMAL HIGH (ref 0–200)
HDL: 47.8 mg/dL (ref >39.00)
LDL Cholesterol: 141 mg/dL — ABNORMAL HIGH (ref 0–99)
NonHDL: 171.1
Total CHOL/HDL Ratio: 5
Triglycerides: 152 mg/dL — ABNORMAL HIGH (ref 0.0–149.0)
VLDL: 30.4 mg/dL (ref 0.0–40.0)

## 2018-11-09 LAB — HEMOGLOBIN A1C: Hgb A1c MFr Bld: 5.9 % (ref 4.6–6.5)

## 2018-11-09 LAB — PSA: PSA: 0.19 ng/mL (ref 0.10–4.00)

## 2018-11-09 NOTE — Patient Instructions (Signed)
Please go to the lab for blood work.   Our office will call you with your results unless you have chosen to receive results via MyChart.  If your blood work is normal we will follow-up each year for physicals and as scheduled for chronic medical problems.  If anything is abnormal we will treat accordingly and get you in for a follow-up.  Make sure to schedule follow-up with your Dermatologist. You will be contacted by Audiology for a comprehensive hearing examination.   Preventive Care 65 Years and Older, Male Preventive care refers to lifestyle choices and visits with your health care provider that can promote health and wellness. This includes:  A yearly physical exam. This is also called an annual well check.  Regular dental and eye exams.  Immunizations.  Screening for certain conditions.  Healthy lifestyle choices, such as diet and exercise. What can I expect for my preventive care visit? Physical exam Your health care provider will check:  Height and weight. These may be used to calculate body mass index (BMI), which is a measurement that tells if you are at a healthy weight.  Heart rate and blood pressure.  Your skin for abnormal spots. Counseling Your health care provider may ask you questions about:  Alcohol, tobacco, and drug use.  Emotional well-being.  Home and relationship well-being.  Sexual activity.  Eating habits.  History of falls.  Memory and ability to understand (cognition).  Work and work Statistician. What immunizations do I need?  Influenza (flu) vaccine  This is recommended every year. Tetanus, diphtheria, and pertussis (Tdap) vaccine  You may need a Td booster every 10 years. Varicella (chickenpox) vaccine  You may need this vaccine if you have not already been vaccinated. Zoster (shingles) vaccine  You may need this after age 65. Pneumococcal conjugate (PCV13) vaccine  One dose is recommended after age 65. Pneumococcal  polysaccharide (PPSV23) vaccine  One dose is recommended after age 65. Measles, mumps, and rubella (MMR) vaccine  You may need at least one dose of MMR if you were born in 1957 or later. You may also need a second dose. Meningococcal conjugate (MenACWY) vaccine  You may need this if you have certain conditions. Hepatitis A vaccine  You may need this if you have certain conditions or if you travel or work in places where you may be exposed to hepatitis A. Hepatitis B vaccine  You may need this if you have certain conditions or if you travel or work in places where you may be exposed to hepatitis B. Haemophilus influenzae type b (Hib) vaccine  You may need this if you have certain conditions. You may receive vaccines as individual doses or as more than one vaccine together in one shot (combination vaccines). Talk with your health care provider about the risks and benefits of combination vaccines. What tests do I need? Blood tests  Lipid and cholesterol levels. These may be checked every 5 years, or more frequently depending on your overall health.  Hepatitis C test.  Hepatitis B test. Screening  Lung cancer screening. You may have this screening every year starting at age 65 if you have a 30-pack-year history of smoking and currently smoke or have quit within the past 15 years.  Colorectal cancer screening. All adults should have this screening starting at age 65 and continuing until age 65. Your health care provider may recommend screening at age 65 if you are at increased risk. You will have tests every 1-10 years, depending on  your results and the type of screening test.  Prostate cancer screening. Recommendations will vary depending on your family history and other risks.  Diabetes screening. This is done by checking your blood sugar (glucose) after you have not eaten for a while (fasting). You may have this done every 1-3 years.  Abdominal aortic aneurysm (AAA) screening. You  may need this if you are a current or former smoker.  Sexually transmitted disease (STD) testing. Follow these instructions at home: Eating and drinking  Eat a diet that includes fresh fruits and vegetables, whole grains, lean protein, and low-fat dairy products. Limit your intake of foods with high amounts of sugar, saturated fats, and salt.  Take vitamin and mineral supplements as recommended by your health care provider.  Do not drink alcohol if your health care provider tells you not to drink.  If you drink alcohol: ? Limit how much you have to 0-2 drinks a day. ? Be aware of how much alcohol is in your drink. In the [place] one drink equals one 12 oz bottle of beer (355 mL), one 5 oz glass of wine (148 mL), or one 1 oz glass of hard liquor (44 mL). Lifestyle  Take daily care of your teeth and gums.  Stay active. Exercise for at least 30 minutes on 5 or more days each week.  Do not use any products that contain nicotine or tobacco, such as cigarettes, e-cigarettes, and chewing tobacco. If you need help quitting, ask your health care provider.  If you are sexually active, practice safe sex. Use a condom or other form of protection to prevent STIs (sexually transmitted infections).  Talk with your health care provider about taking a low-dose aspirin or statin. What's next?  Visit your health care provider once a year for a well check visit.  Ask your health care provider how often you should have your eyes and teeth checked.  Stay up to date on all vaccines. This information is not intended to replace advice given to you by your health care provider. Make sure you discuss any questions you have with your health care provider. Document Released: 02/13/2015 Document Revised: 01/11/2018 Document Reviewed: 01/11/2018 Elsevier Patient Education  2020 [employer].

## 2018-11-09 NOTE — Progress Notes (Signed)
Patient presents to clinic today for annual exam.  Patient is fasting for labs.  Acute Concerns: Endorses gradual decrease in hearing bilaterally x 2 years. Notes hard time picking out words, especially if there are multiple people talking. Denies tinnitus, fullness of ear, dizziness. Denies nasal congestion, sinus pressure.   Endorses lesion of lower back first noted in July. Was initially thought to be a boil with some pain and drainage. Denies any residual pain, or drainage. Notes periodic itching. Sometimes seems a bit smaller but then comes back. .  Chronic Issues: Hypertension -- Currently on regimen of Lopressor 50 mg BID, Maxzide 37.5-25 mg QD. Endorses taking daily as directed. Patient denies chest pain, palpitations, lightheadedness, dizziness, vision changes or frequent headaches.  BP Readings from Last 3 Encounters:  11/09/18 118/70  09/05/18 129/75  08/02/18 (!) 142/86   Hyperlipidemia -- Is currently on regimen of Pravachol 40 mg BID to get total daily dose of Pravachol. Has been working hard on diet and exercise. Body mass index is 33.75 kg/m.  Depression/Anxiety -- Taking Wellbutrin 300 mg QD with Alprazolam PRN. Notes rare use of Alprazolam. Doing very well overall despite all of the stressors going on currently with COVID. Denies breakthrough depression. Denies SI/HI.  Health Maintenance: Immunizations -- Agrees to Flu shot and Prevnar today Colonoscopy -- UTD  Past Medical History:  Diagnosis Date  . Bulging lumbar disc   . Colon polyps   . Depression   . GERD (gastroesophageal reflux disease)   . Hyperlipidemia   . Hypertension   . Spinal stenosis     Past Surgical History:  Procedure Laterality Date  . broken nose   1974  . I&D EXTREMITY Left 12/18/2012   Procedure: I&D Left Middle Finger With Repair Recons.Prn;  Surgeon: Roseanne Kaufman, MD;  Location: [facility];  Service: Orthopedics;  Laterality: Left;  . I&D EXTREMITY Left 12/20/2012   Procedure:  LEFT HAND Incision and drainage  TO INCLUDE MIDDLE FINGER FLEXOR TENDON;  Surgeon: Roseanne Kaufman, MD;  Location: [facility];  Service: Orthopedics;  Laterality: Left;  . I&D EXTREMITY Left 12/22/2012   Procedure: IRRIGATION AND DEBRIDEMENT HAND;  Surgeon: Roseanne Kaufman, MD;  Location: [facility];  Service: Orthopedics;  Laterality: Left;  . TYMPANIC MEMBRANE REPAIR    . WISDOM TOOTH EXTRACTION      Current Outpatient Medications on File Prior to Visit  Medication Sig Dispense Refill  . ALPRAZolam (XANAX) 0.25 MG tablet TAKE 1 TABLET BY MOUTH 2 TIMES DAILY AS NEEDED FOR ANXIETY. 20 tablet 0  . buPROPion (WELLBUTRIN XL) 300 MG 24 hr tablet TAKE 1 TABLET BY MOUTH EVERY DAY 90 tablet 1  . carboxymethylcellulose 1 % ophthalmic solution 1 drop 2 times daily.    . Diclofenac Sodium (PENNSAID) 2 % SOLN Place 1 application onto the skin 2 (two) times daily. 112 g 3  . finasteride (PROSCAR) 5 MG tablet Take 5 mg by mouth daily.  3  . metoprolol tartrate (LOPRESSOR) 50 MG tablet TAKE 1 TABLET BY MOUTH TWICE A DAY 180 tablet 1  . Multiple Vitamins-Minerals (MULTIVITAMIN WITH MINERALS) tablet Take by mouth.    Marland Kitchen omeprazole (PRILOSEC) 20 MG capsule TAKE 1 CAPSULE BY MOUTH EVERY DAY 90 capsule 1  . pravastatin (PRAVACHOL) 40 MG tablet Take 1 tablet (40 mg total) by mouth daily. Please schedule a lab visit for recheck of cholesterol. 90 tablet 0  . sildenafil (REVATIO) 20 MG tablet TAKE 1 TABLET (20 MG TOTAL) 3 (THREE) TIMES DAILY  BY MOUTH. 10 tablet 5  . tamsulosin (FLOMAX) 0.4 MG CAPS capsule TAKE 1 CAPSULE BY MOUTH EVERY DAY 90 capsule 1  . triamterene-hydrochlorothiazide (MAXZIDE-25) 37.5-25 MG tablet TAKE 1 TABLET BY MOUTH DAILY. 90 tablet 2   No current facility-administered medications on file prior to visit.     No Known Allergies  Family History  Problem Relation Age of Onset  . Hypertension Father 35       Deceased  . Neuropathy Father   . Kidney cancer Mother 51       Deceased  . Kidney disease  Mother   . Kidney cancer Maternal Grandmother   . Cancer Other        Aunts & Uncles  . Diabetes Brother   . Breast cancer Sister   . Healthy Brother        x1  . Healthy Daughter        x2    Social History   Socioeconomic History  . Marital status: Single    Spouse name: Not on file  . Number of children: 2  . Years of education: Not on file  . Highest education level: Not on file  Occupational History  . Occupation: CFO  Social Needs  . Financial resource strain: Not on file  . Food insecurity    Worry: Not on file    Inability: Not on file  . Transportation needs    Medical: Not on file    Non-medical: Not on file  Tobacco Use  . Smoking status: Former Research scientist (life sciences)  . Smokeless tobacco: Never Used  Substance and Sexual Activity  . Alcohol use: Yes    Alcohol/week: 0.0 standard drinks    Comment: social  . Drug use: No  . Sexual activity: Yes  Lifestyle  . Physical activity    Days per week: Not on file    Minutes per session: Not on file  . Stress: Not on file  Relationships  . Social Herbalist on phone: Not on file    Gets together: Not on file    Attends religious service: Not on file    Active member of club or organization: Not on file    Attends meetings of clubs or organizations: Not on file    Relationship status: Not on file  . Intimate partner violence    Fear of current or ex partner: Not on file    Emotionally abused: Not on file    Physically abused: Not on file    Forced sexual activity: Not on file  Other Topics Concern  . Not on file  Social History Narrative  . Not on file    Review of Systems  Constitutional: Negative for fever and weight loss.  HENT: Positive for hearing loss. Negative for ear discharge, ear pain and tinnitus.   Eyes: Negative for blurred vision, double vision, photophobia and pain.  Respiratory: Negative for cough and shortness of breath.   Cardiovascular: Negative for chest pain and palpitations.   Gastrointestinal: Negative for abdominal pain, blood in stool, constipation, diarrhea, heartburn, melena, nausea and vomiting.  Genitourinary: Negative for dysuria, flank pain, frequency, hematuria and urgency.  Musculoskeletal: Negative for falls.  Neurological: Negative for dizziness, loss of consciousness and headaches.  Endo/Heme/Allergies: Negative for environmental allergies.  Psychiatric/Behavioral: Negative for depression, hallucinations, substance abuse and suicidal ideas. The patient is not nervous/anxious and does not have insomnia.    BP 118/70   Pulse 64   Temp 98.2 F (  36.8 C) (Temporal)   Resp 16   Ht 5\' 11"  (1.803 m)   Wt 242 lb (109.8 kg)   SpO2 99%   BMI 33.75 kg/m   Physical Exam Vitals signs reviewed.  Constitutional:      General: He is not in acute distress.    Appearance: Normal appearance. He is well-developed. He is not diaphoretic.  HENT:     Head: Normocephalic and atraumatic.     Jaw: There is normal jaw occlusion.     Right Ear: Tympanic membrane, ear canal and external ear normal.     Left Ear: Tympanic membrane, ear canal and external ear normal.     Nose: Nose normal.     Mouth/Throat:     Pharynx: No posterior oropharyngeal erythema.  Eyes:     Conjunctiva/sclera: Conjunctivae normal.     Pupils: Pupils are equal, round, and reactive to light.  Neck:     Musculoskeletal: Neck supple.     Thyroid: No thyromegaly.  Cardiovascular:     Rate and Rhythm: Normal rate and regular rhythm.     Pulses: Normal pulses.     Heart sounds: Normal heart sounds.  Pulmonary:     Effort: Pulmonary effort is normal. No respiratory distress.     Breath sounds: Normal breath sounds. No wheezing or rales.  Chest:     Chest wall: No tenderness.  Abdominal:     General: Bowel sounds are normal. There is no distension.     Palpations: Abdomen is soft. There is no mass.     Tenderness: There is no abdominal tenderness. There is no guarding or rebound.   Lymphadenopathy:     Cervical: No cervical adenopathy.  Skin:    General: Skin is warm and dry.     Findings: No rash.       Neurological:     Mental Status: He is alert and oriented to person, place, and time.     Cranial Nerves: No cranial nerve deficit.    Assessment/Plan: 1. Visit for preventive health examination Depression screen negative. Health Maintenance reviewed. Preventive schedule discussed and handout given in AVS. Will obtain fasting labs today.  - CBC with Differential/Platelet - Comprehensive metabolic panel - Hemoglobin A1c - Lipid panel  2. Prostate cancer screening The natural history of prostate cancer and ongoing controversy regarding screening and potential treatment outcomes of prostate cancer has been discussed with the patient. The meaning of a false positive PSA and a false negative PSA has been discussed. He indicates understanding of the limitations of this screening test and wishes to proceed with screening PSA testing.  - PSA  3. Need for immunization against influenza - Flu Vaccine QUAD High Dose(Fluad)  4. Need for vaccination against Streptococcus pneumoniae using pneumococcal conjugate vaccine 13 - Pneumococcal conjugate vaccine 13-valent IM  5. Essential hypertension BP normotensive. Asymptomatic. Continue current regimen. Repeat labs today.  6. Pure hypercholesterolemia Taking medications as directed. Recheck fasting lipids, CMP and A1C. - Comprehensive metabolic panel - Hemoglobin A1c - Lipid panel  7. Benign prostatic hyperplasia with lower urinary tract symptoms, symptom details unspecified Doing well on current medication. Repeat PSA today. Continue current regimen.  - PSA  8. Anxiety and depression Stable. Continue current regimen.   9. Bilateral hearing loss, unspecified hearing loss type Failed in-office simple screen. Referral to Audiology placed for comprehensive assessment and management.  10. Skin lesion Cyst. No  active infection. He has upcoming appointment with Dermatology and will discuss removal with  them.    Leeanne Rio, PA-C

## 2018-11-10 ENCOUNTER — Encounter: Payer: Self-pay | Admitting: Physician Assistant

## 2018-11-12 ENCOUNTER — Other Ambulatory Visit: Payer: Self-pay | Admitting: Physician Assistant

## 2018-11-12 MED ORDER — SERTRALINE HCL 25 MG PO TABS: 25.0000 mg | ORAL_TABLET | Freq: Every day | ORAL | 1 refills | Status: DC

## 2018-11-12 MED ORDER — LIVALO 4 MG PO TABS: 4.0000 mg | ORAL_TABLET | Freq: Every day | ORAL | 3 refills | Status: DC

## 2018-11-12 NOTE — Addendum Note (Signed)
Addended by: Brunetta Jeans on: 11/12/2018 12:21 PM   Modules accepted: Orders

## 2018-11-12 NOTE — Addendum Note (Signed)
Addended by: Brunetta Jeans on: 11/12/2018 04:52 PM   Modules accepted: Orders

## 2018-11-13 ENCOUNTER — Other Ambulatory Visit: Payer: Self-pay | Admitting: Physician Assistant

## 2018-11-30 ENCOUNTER — Encounter: Payer: Self-pay | Admitting: Physician Assistant

## 2018-12-09 ENCOUNTER — Other Ambulatory Visit: Payer: Self-pay | Admitting: Physician Assistant

## 2019-01-01 ENCOUNTER — Other Ambulatory Visit: Payer: Self-pay | Admitting: Physician Assistant

## 2019-02-09 ENCOUNTER — Other Ambulatory Visit: Payer: Self-pay | Admitting: Physician Assistant

## 2019-02-12 ENCOUNTER — Other Ambulatory Visit: Payer: Self-pay | Admitting: *Deleted

## 2019-02-12 DIAGNOSIS — E781 Pure hyperglyceridemia: Secondary | ICD-10-CM

## 2019-02-12 NOTE — Telephone Encounter (Signed)
Pt has lab appointment on 02/18/2019

## 2019-02-13 ENCOUNTER — Other Ambulatory Visit: Payer: Self-pay | Admitting: Physician Assistant

## 2019-02-15 ENCOUNTER — Other Ambulatory Visit: Payer: Self-pay

## 2019-02-18 ENCOUNTER — Ambulatory Visit (INDEPENDENT_AMBULATORY_CARE_PROVIDER_SITE_OTHER): Payer: 59

## 2019-02-18 DIAGNOSIS — E781 Pure hyperglyceridemia: Secondary | ICD-10-CM | POA: Diagnosis not present

## 2019-02-18 LAB — LIPID PANEL
Cholesterol: 202 mg/dL — ABNORMAL HIGH (ref 0–200)
HDL: 48.4 mg/dL (ref >39.00)
LDL Cholesterol: 122 mg/dL — ABNORMAL HIGH (ref 0–99)
NonHDL: 153.36
Total CHOL/HDL Ratio: 4
Triglycerides: 156 mg/dL — ABNORMAL HIGH (ref 0.0–149.0)
VLDL: 31.2 mg/dL (ref 0.0–40.0)

## 2019-02-18 LAB — HEPATIC FUNCTION PANEL
ALT: 23 U/L (ref 0–53)
AST: 16 U/L (ref 0–37)
Albumin: 4.2 g/dL (ref 3.5–5.2)
Alkaline Phosphatase: 75 U/L (ref 39–117)
Bilirubin, Direct: 0.1 mg/dL (ref 0.0–0.3)
Total Bilirubin: 0.6 mg/dL (ref 0.2–1.2)
Total Protein: 6.7 g/dL (ref 6.0–8.3)

## 2019-03-11 ENCOUNTER — Other Ambulatory Visit: Payer: Self-pay | Admitting: Emergency Medicine

## 2019-03-11 DIAGNOSIS — Z1211 Encounter for screening for malignant neoplasm of colon: Secondary | ICD-10-CM

## 2019-03-15 ENCOUNTER — Encounter: Payer: Self-pay | Admitting: Gastroenterology

## 2019-03-31 ENCOUNTER — Other Ambulatory Visit: Payer: Self-pay | Admitting: Physician Assistant

## 2019-04-10 ENCOUNTER — Ambulatory Visit (AMBULATORY_SURGERY_CENTER): Payer: Self-pay | Admitting: *Deleted

## 2019-04-10 ENCOUNTER — Other Ambulatory Visit: Payer: Self-pay

## 2019-04-10 VITALS — Temp 97.3°F | Ht 71.0 in | Wt 243.2 lb

## 2019-04-10 DIAGNOSIS — Z1211 Encounter for screening for malignant neoplasm of colon: Secondary | ICD-10-CM

## 2019-04-10 NOTE — Progress Notes (Signed)
Pt received 2nd dose of covid vaccine on 04-08-19- no need for pre procedure test   No trouble with anesthesia, difficulty with moving neck or hx/fam hx of malignant hyperthermia per pt   Pt is aware that care partner will wait in the car during procedure; if they feel like they will be too hot or cold to wait in the car; they may wait in the 4 th floor lobby. Patient is aware to bring only one care partner. We want them to wear a mask (we do not have any that we can provide them), practice social distancing, and we will check their temperatures when they get here.  I did remind the patient that their care partner needs to stay in the parking lot the entire time and have a cell phone available, we will call them when the pt is ready for discharge. Patient will wear mask into building.   No egg or soy allergy  No home oxygen use   No medications for weight loss taken  emmi information given

## 2019-04-19 ENCOUNTER — Encounter: Payer: Self-pay | Admitting: Gastroenterology

## 2019-04-24 ENCOUNTER — Encounter: Payer: Self-pay | Admitting: Gastroenterology

## 2019-04-24 ENCOUNTER — Ambulatory Visit (AMBULATORY_SURGERY_CENTER): Payer: 59 | Admitting: Gastroenterology

## 2019-04-24 ENCOUNTER — Other Ambulatory Visit: Payer: Self-pay

## 2019-04-24 VITALS — BP 111/57 | HR 57 | Temp 97.3°F | Resp 14 | Ht 71.0 in | Wt 243.2 lb

## 2019-04-24 DIAGNOSIS — Z1211 Encounter for screening for malignant neoplasm of colon: Secondary | ICD-10-CM

## 2019-04-24 DIAGNOSIS — K635 Polyp of colon: Secondary | ICD-10-CM | POA: Diagnosis not present

## 2019-04-24 DIAGNOSIS — D123 Benign neoplasm of transverse colon: Secondary | ICD-10-CM

## 2019-04-24 DIAGNOSIS — Z86010 Personal history of colonic polyps: Secondary | ICD-10-CM

## 2019-04-24 MED ORDER — SODIUM CHLORIDE 0.9 % IV SOLN: 500.0000 mL | Freq: Once | INTRAVENOUS | Status: DC

## 2019-04-24 NOTE — Patient Instructions (Signed)
YOU HAD AN ENDOSCOPIC PROCEDURE TODAY AT THE [facility]:   Refer to the procedure report that was given to you for any specific questions about what was found during the examination.  If the procedure report does not answer your questions, please call your gastroenterologist to clarify.  If you requested that your care partner not be given the details of your procedure findings, then the procedure report has been included in a sealed envelope for you to review at your convenience later.  YOU SHOULD EXPECT: Some feelings of bloating in the abdomen. Passage of more gas than usual.  Walking can help get rid of the air that was put into your GI tract during the procedure and reduce the bloating. If you had a lower endoscopy (such as a colonoscopy or flexible sigmoidoscopy) you may notice spotting of blood in your stool or on the toilet paper. If you underwent a bowel prep for your procedure, you may not have a normal bowel movement for a few days.  Please Note:  You might notice some irritation and congestion in your nose or some drainage.  This is from the oxygen used during your procedure.  There is no need for concern and it should clear up in a day or so.  SYMPTOMS TO REPORT IMMEDIATELY:  Following lower endoscopy (colonoscopy or flexible sigmoidoscopy):  Excessive amounts of blood in the stool  Significant tenderness or worsening of abdominal pains  Swelling of the abdomen that is new, acute  Fever of 100F or higher    For urgent or emergent issues, a gastroenterologist can be reached at any hour by calling [phone]. Do not use MyChart messaging for urgent concerns.    DIET:  We do recommend a small meal at first, but then you may proceed to your regular diet.  Drink plenty of fluids but you should avoid alcoholic beverages for 24 hours.  ACTIVITY:  You should plan to take it easy for the rest of today and you should NOT DRIVE or use heavy machinery until tomorrow (because  of the sedation medicines used during the test).    FOLLOW UP: Our staff will call the number listed on your records 48-72 hours following your procedure to check on you and address any questions or concerns that you may have regarding the information given to you following your procedure. If we do not reach you, we will leave a message.  We will attempt to reach you two times.  During this call, we will ask if you have developed any symptoms of COVID 19. If you develop any symptoms (ie: fever, flu-like symptoms, shortness of breath, cough etc.) before then, please call [phone].  If you test positive for Covid 19 in the 2 weeks post procedure, please call and report this information to us.    If any biopsies were taken you will be contacted by phone or by letter within the next 1-3 weeks.  Please call us at [phone] if you have not heard about the biopsies in 3 weeks.    SIGNATURES/CONFIDENTIALITY: You and/or your care partner have signed paperwork which will be entered into your electronic medical record.  These signatures attest to the fact that that the information above on your After Visit Summary has been reviewed and is understood.  Full responsibility of the confidentiality of this discharge information lies with you and/or your care-partner.    Resume medications. Information given on polyps. 

## 2019-04-24 NOTE — Progress Notes (Signed)
Pt Drowsy. VSS. To PACU, report to RN. No anesthetic complications noted.  

## 2019-04-24 NOTE — Progress Notes (Signed)
Called to room to assist during endoscopic procedure.  Patient ID and intended procedure confirmed with present staff. Received instructions for my participation in the procedure from the performing physician.  

## 2019-04-24 NOTE — Progress Notes (Signed)
Temp-LC VS-CW  Pt's states no medical or surgical changes since previsit or office visit.  

## 2019-04-24 NOTE — Op Note (Signed)
[facility] Patient Name: Antonio Cooper Procedure Date: 04/24/2019 11:02 AM MRN: [id number] Endoscopist: Milus Banister , MD Age: 64 Referring MD:  Date of Birth: 1956-01-28 Gender: Male Account #: [id number] Procedure:                Colonoscopy Indications:              Screening for colorectal malignant neoplasm.                            Colonoscopy Dr. Earlean Shawl February 2011 done for                            "surveillance examination at five-year interval".                            He found one 6 mm polyp removed with cold biopsy.                            This was proven to be a hyperplastic polyp on                            pathology. He had a colonoscopy November 2005 done                            for routine screening. Dr. Earlean Shawl documented a                            diminutive 5 mm polyp as well as a 9 mm polyp.                            These were both in the "left colon and they were                            adjacent to each other. One was removed with snare                            cautery and the other was removed with hot biopsy                            forceps. 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 Medicines:                Monitored Anesthesia Care Procedure:                Pre-Anesthesia Assessment:                           - Prior to the procedure, a History and Physical                            was performed, and patient medications and  allergies were reviewed. The patient's tolerance of                            previous anesthesia was also reviewed. The risks                            and benefits of the procedure and the sedation                            options and risks were discussed with the patient.                            All questions were answered, and informed consent                            was obtained. Prior Anticoagulants: The patient  has                            taken no previous anticoagulant or antiplatelet                            agents. ASA Grade Assessment: II - A patient with                            mild systemic disease. After reviewing the risks                            and benefits, the patient was deemed in                            satisfactory condition to undergo the procedure.                           After obtaining informed consent, the colonoscope                            was passed under direct vision. Throughout the                            procedure, the patient's blood pressure, pulse, and                            oxygen saturations were monitored continuously. The                            Colonoscope was introduced through the anus and                            advanced to the the cecum, identified by                            appendiceal orifice and ileocecal valve. The  colonoscopy was performed without difficulty. The                            patient tolerated the procedure well. The quality                            of the bowel preparation was good. The ileocecal                            valve, appendiceal orifice, and rectum were                            photographed. Scope In: 11:08:14 AM Scope Out: 11:22:32 AM Scope Withdrawal Time: 0 hours 10 minutes 10 seconds  Total Procedure Duration: 0 hours 14 minutes 18 seconds  Findings:                 Two sessile polyps were found in the transverse                            colon. The polyps were 2 to 3 mm in size. These                            polyps were removed with a cold snare. Resection                            and retrieval were complete.                           The exam was otherwise without abnormality on                            direct and retroflexion views. Complications:            No immediate complications. Estimated blood loss:                             None. Estimated Blood Loss:     Estimated blood loss: none. Impression:               - Two 2 to 3 mm polyps in the transverse colon,                            removed with a cold snare. Resected and retrieved.                           - The examination was otherwise normal on direct                            and retroflexion views. Recommendation:           - Patient has a contact number available for                            emergencies. The signs and symptoms of potential  delayed complications were discussed with the                            patient. Return to normal activities tomorrow.                            Written discharge instructions were provided to the                            patient.                           - Resume previous diet.                           - Continue present medications.                           - Await pathology results. Milus Banister, MD 04/24/2019 11:28:35 AM This report has been signed electronically.

## 2019-04-26 ENCOUNTER — Telehealth: Payer: Self-pay | Admitting: *Deleted

## 2019-04-26 NOTE — Telephone Encounter (Signed)
Attempted f/u phone call. No answer, left message.

## 2019-04-26 NOTE — Telephone Encounter (Signed)
  Follow up Call-  Call back number 04/24/2019  Post procedure Call Back phone  # [phone]  Permission to leave phone message Yes  Some recent data might be hidden     Patient questions:  Do you have a fever, pain , or abdominal swelling? No. Pain Score  0 *  Have you tolerated food without any problems? Yes.    Have you been able to return to your normal activities? Yes.    Do you have any questions about your discharge instructions: Diet   No. Medications  No. Follow up visit  No.  Do you have questions or concerns about your Care? No.  Actions: * If pain score is 4 or above: No action needed, pain <4.  1. Have you developed a fever since your procedure? no  2.   Have you had an respiratory symptoms (SOB or cough) since your procedure? no  3.   Have you tested positive for COVID 19 since your procedure no  4.   Have you had any family members/close contacts diagnosed with the COVID 19 since your procedure? no   If yes to any of these questions please route to Joylene John, RN and Erenest Rasher, RN

## 2019-04-30 ENCOUNTER — Encounter: Payer: Self-pay | Admitting: Gastroenterology

## 2019-05-13 ENCOUNTER — Other Ambulatory Visit: Payer: Self-pay | Admitting: Physician Assistant

## 2019-05-14 ENCOUNTER — Encounter: Payer: Self-pay | Admitting: Emergency Medicine

## 2019-05-19 ENCOUNTER — Other Ambulatory Visit: Payer: Self-pay | Admitting: Physician Assistant

## 2019-06-22 ENCOUNTER — Other Ambulatory Visit: Payer: Self-pay | Admitting: Physician Assistant

## 2019-06-22 ENCOUNTER — Encounter: Payer: Self-pay | Admitting: Physician Assistant

## 2019-07-26 ENCOUNTER — Other Ambulatory Visit: Payer: Self-pay | Admitting: Physician Assistant

## 2019-08-10 ENCOUNTER — Other Ambulatory Visit: Payer: Self-pay | Admitting: Physician Assistant

## 2019-08-16 ENCOUNTER — Encounter: Payer: Self-pay | Admitting: Physician Assistant

## 2019-08-22 ENCOUNTER — Ambulatory Visit: Payer: 59 | Admitting: Physician Assistant

## 2019-08-27 ENCOUNTER — Encounter: Payer: Self-pay | Admitting: Physician Assistant

## 2019-08-27 ENCOUNTER — Other Ambulatory Visit: Payer: Self-pay

## 2019-08-27 ENCOUNTER — Ambulatory Visit (INDEPENDENT_AMBULATORY_CARE_PROVIDER_SITE_OTHER): Payer: 59 | Admitting: Physician Assistant

## 2019-08-27 VITALS — BP 130/80 | HR 68 | Temp 97.8°F | Resp 16 | Ht 71.0 in | Wt 236.0 lb

## 2019-08-27 DIAGNOSIS — E7800 Pure hypercholesterolemia, unspecified: Secondary | ICD-10-CM

## 2019-08-27 DIAGNOSIS — F32A Depression, unspecified: Secondary | ICD-10-CM

## 2019-08-27 DIAGNOSIS — F419 Anxiety disorder, unspecified: Secondary | ICD-10-CM

## 2019-08-27 DIAGNOSIS — F329 Major depressive disorder, single episode, unspecified: Secondary | ICD-10-CM | POA: Diagnosis not present

## 2019-08-27 DIAGNOSIS — I10 Essential (primary) hypertension: Secondary | ICD-10-CM | POA: Diagnosis not present

## 2019-08-27 LAB — COMPREHENSIVE METABOLIC PANEL
ALT: 19 U/L (ref 0–53)
AST: 16 U/L (ref 0–37)
Albumin: 4.3 g/dL (ref 3.5–5.2)
Alkaline Phosphatase: 75 U/L (ref 39–117)
BUN: 15 mg/dL (ref 6–23)
CO2: 31 mEq/L (ref 19–32)
Calcium: 9.6 mg/dL (ref 8.4–10.5)
Chloride: 101 mEq/L (ref 96–112)
Creatinine, Ser: 1.05 mg/dL (ref 0.40–1.50)
GFR: 70.7 mL/min (ref >60.00)
Glucose, Bld: 105 mg/dL — ABNORMAL HIGH (ref 70–99)
Potassium: 4.1 mEq/L (ref 3.5–5.1)
Sodium: 139 mEq/L (ref 135–145)
Total Bilirubin: 0.7 mg/dL (ref 0.2–1.2)
Total Protein: 6.7 g/dL (ref 6.0–8.3)

## 2019-08-27 LAB — LIPID PANEL
Cholesterol: 197 mg/dL (ref 0–200)
HDL: 46.9 mg/dL (ref >39.00)
NonHDL: 149.91
Total CHOL/HDL Ratio: 4
Triglycerides: 210 mg/dL — ABNORMAL HIGH (ref 0.0–149.0)
VLDL: 42 mg/dL — ABNORMAL HIGH (ref 0.0–40.0)

## 2019-08-27 LAB — LDL CHOLESTEROL, DIRECT: Direct LDL: 112 mg/dL

## 2019-08-27 MED ORDER — SERTRALINE HCL 50 MG PO TABS
50.0000 mg | ORAL_TABLET | Freq: Every day | ORAL | 3 refills | Status: DC
Start: 2019-08-27 — End: 2019-09-02

## 2019-08-27 NOTE — Patient Instructions (Signed)
Please go to the lab today for blood work.  I will call you with your results. We will alter treatment regimen(s) if indicated by your results.   Start the new dose of Sertraline (50 mg) daily. Follow-up with me in 4 weeks or so to let me know how things are going.  Continue other medications as directed.  Hang in there!   Cholesterol Content in Foods Cholesterol is a waxy, fat-like substance that helps to carry fat in the blood. The body needs cholesterol in small amounts, but too much cholesterol can cause damage to the arteries and heart. Most people should eat less than 200 milligrams (mg) of cholesterol a day. Foods with cholesterol  Cholesterol is found in animal-based foods, such as meat, seafood, and dairy. Generally, low-fat dairy and lean meats have less cholesterol than full-fat dairy and fatty meats. The milligrams of cholesterol per serving (mg per serving) of common cholesterol-containing foods are listed below. Meat and other proteins  Egg -- one large whole egg has 186 mg.  Veal shank -- 4 oz has 141 mg.  Lean ground [place] (93% lean) -- 4 oz has 118 mg.  Fat-trimmed lamb loin -- 4 oz has 106 mg.  Lean ground beef (90% lean) -- 4 oz has 100 mg.  Lobster -- 3.5 oz has 90 mg.  Pork loin chops -- 4 oz has 86 mg.  Canned salmon -- 3.5 oz has 83 mg.  Fat-trimmed beef top loin -- 4 oz has 78 mg.  Frankfurter -- 1 frank (3.5 oz) has 77 mg.  Crab -- 3.5 oz has 71 mg.  Roasted chicken without skin, white meat -- 4 oz has 66 mg.  Light bologna -- 2 oz has 45 mg.  Deli-cut [place] -- 2 oz has 31 mg.  Canned tuna -- 3.5 oz has 31 mg.  Berniece Salines -- 1 oz has 29 mg.  Oysters and mussels (raw) -- 3.5 oz has 25 mg.  Mackerel -- 1 oz has 22 mg.  Trout -- 1 oz has 20 mg.  Pork sausage -- 1 link (1 oz) has 17 mg.  Salmon -- 1 oz has 16 mg.  Tilapia -- 1 oz has 14 mg. Dairy  Soft-serve ice cream --  cup (4 oz) has 103 mg.  Whole-milk yogurt -- 1 cup (8 oz) has  29 mg.  Cheddar cheese -- 1 oz has 28 mg.  American cheese -- 1 oz has 28 mg.  Whole milk -- 1 cup (8 oz) has 23 mg.  2% milk -- 1 cup (8 oz) has 18 mg.  Cream cheese -- 1 tablespoon (Tbsp) has 15 mg.  Cottage cheese --  cup (4 oz) has 14 mg.  Low-fat (1%) milk -- 1 cup (8 oz) has 10 mg.  Sour cream -- 1 Tbsp has 8.5 mg.  Low-fat yogurt -- 1 cup (8 oz) has 8 mg.  Nonfat Greek yogurt -- 1 cup (8 oz) has 7 mg.  Half-and-half cream -- 1 Tbsp has 5 mg. Fats and oils  Cod liver oil -- 1 tablespoon (Tbsp) has 82 mg.  Butter -- 1 Tbsp has 15 mg.  Lard -- 1 Tbsp has 14 mg.  Bacon grease -- 1 Tbsp has 14 mg.  Mayonnaise -- 1 Tbsp has 5-10 mg.  Margarine -- 1 Tbsp has 3-10 mg. Exact amounts of cholesterol in these foods may vary depending on specific ingredients and brands. Foods without cholesterol Most plant-based foods do not have cholesterol unless you combine  them with a food that has cholesterol. Foods without cholesterol include:  Grains and cereals.  Vegetables.  Fruits.  Vegetable oils, such as olive, canola, and sunflower oil.  Legumes, such as peas, beans, and lentils.  Nuts and seeds.  Egg whites. Summary  The body needs cholesterol in small amounts, but too much cholesterol can cause damage to the arteries and heart.  Most people should eat less than 200 milligrams (mg) of cholesterol a day. This information is not intended to replace advice given to you by your health care provider. Make sure you discuss any questions you have with your health care provider. Document Revised: 12/30/2016 Document Reviewed: 09/13/2016 Elsevier Patient Education  [place].

## 2019-08-27 NOTE — Progress Notes (Signed)
Patient presents to clinic today for follow-up of chronic medical conditions.   Hypertension -- Patient is currently on Metoprolol 50 mg BID and Maxzide 37.5-25 mg QD. Endorses taking medications daily as directed. Still tolerating well. Patient denies chest pain, palpitations, lightheadedness, dizziness, vision changes or frequent headaches.  BP Readings from Last 3 Encounters:  08/27/19 (!) 130/80  04/24/19 (!) 111/57  11/09/18 118/70   Hyperlipidemia -- Patient is currently on a regimen of Livalo 4 mg daily. Endorses taking as directed. Tolerating well. Is trying to keep a well-balanced diet. Has been walking 1 mile daily..   Anxiety/Depression -- Currently on a combination of Wellbutrin XL 300 mg and Sertraline 25 mg daily. Endorses taking as directed. Notes this was helping well before but due to recent increase in stressors is having breakthrough symptoms. Denies SI/HI.   Past Medical History:  Diagnosis Date  . Allergy   . Bulging lumbar disc   . Cataract   . Colon polyps   . Depression   . GERD (gastroesophageal reflux disease)   . Hyperlipidemia   . Hypertension   . Spinal stenosis     Current Outpatient Medications on File Prior to Visit  Medication Sig Dispense Refill  . buPROPion (WELLBUTRIN XL) 300 MG 24 hr tablet TAKE 1 TABLET BY MOUTH EVERY DAY 90 tablet 0  . carboxymethylcellulose 1 % ophthalmic solution 1 drop 2 times daily.    . finasteride (PROSCAR) 5 MG tablet Take 5 mg by mouth daily.  3  . LIVALO 4 MG TABS TAKE 1 TABLET BY MOUTH EVERY DAY 90 tablet 0  . metoprolol tartrate (LOPRESSOR) 50 MG tablet TAKE 1 TABLET BY MOUTH TWICE A DAY 180 tablet 0  . Multiple Vitamins-Minerals (MULTIVITAMIN WITH MINERALS) tablet Take by mouth.    Marland Kitchen omeprazole (PRILOSEC) 20 MG capsule TAKE 1 CAPSULE BY MOUTH EVERY DAY 90 capsule 0  . sertraline (ZOLOFT) 25 MG tablet TAKE 1 TABLET BY MOUTH EVERY DAY 90 tablet 1  . sildenafil (REVATIO) 20 MG tablet TAKE 1 TABLET (20 MG TOTAL) 3  (THREE) TIMES DAILY BY MOUTH. 10 tablet 5  . triamterene-hydrochlorothiazide (MAXZIDE-25) 37.5-25 MG tablet TAKE 1 TABLET BY MOUTH EVERY DAY 90 tablet 2   No current facility-administered medications on file prior to visit.    No Known Allergies  Family History  Problem Relation Age of Onset  . Hypertension Father 40       Deceased  . Neuropathy Father   . Kidney cancer Mother 54       Deceased  . Kidney disease Mother   . Kidney cancer Maternal Grandmother   . Cancer Other        Aunts & Uncles  . Colon cancer Other   . Diabetes Brother   . Breast cancer Sister   . Healthy Brother        x1  . Healthy Daughter        x2  . Esophageal cancer Neg Hx   . Rectal cancer Neg Hx   . Stomach cancer Neg Hx     Social History   Socioeconomic History  . Marital status: Single    Spouse name: Not on file  . Number of children: 2  . Years of education: Not on file  . Highest education level: Not on file  Occupational History  . Occupation: CFO  Tobacco Use  . Smoking status: Former Research scientist (life sciences)  . Smokeless tobacco: Never Used  Vaping Use  . Vaping Use: Never  used  Substance and Sexual Activity  . Alcohol use: Yes    Alcohol/week: 0.0 standard drinks    Comment: social  . Drug use: No  . Sexual activity: Yes  Other Topics Concern  . Not on file  Social History Narrative  . Not on file   Social Determinants of Health   Financial Resource Strain:   . Difficulty of Paying Living Expenses:   Food Insecurity:   . Worried About Charity fundraiser in the Last Year:   . Arboriculturist in the Last Year:   Transportation Needs:   . Film/video editor (Medical):   Marland Kitchen Lack of Transportation (Non-Medical):   Physical Activity:   . Days of Exercise per Week:   . Minutes of Exercise per Session:   Stress:   . Feeling of Stress :   Social Connections:   . Frequency of Communication with Friends and Family:   . Frequency of Social Gatherings with Friends and Family:   .  Attends Religious Services:   . Active Member of Clubs or Organizations:   . Attends Archivist Meetings:   Marland Kitchen Marital Status:    Review of Systems - See HPI.  All other ROS are negative.  Resp 16   Ht '5\' 11"'  (1.803 m)   Wt (!) 236 lb (107 kg)   BMI 32.92 kg/m   Physical Exam Vitals reviewed.  Constitutional:      Appearance: Normal appearance.  HENT:     Head: Normocephalic and atraumatic.  Cardiovascular:     Rate and Rhythm: Normal rate and regular rhythm.  Musculoskeletal:     Cervical back: Neck supple.  Neurological:     Mental Status: He is alert.    Assessment/Plan: 1. Pure hypercholesterolemia Taking medications as directed.  Dietary and exercise recommendations reviewed.  Repeat fasting lipids and CMP today.  Will alter regimen according to results. - Comp Met (CMET) - Lipid Profile  2. Essential hypertension BP stable.  Asymptomatic.  Repeat labs today.  3. Anxiety and depression Slight deterioration due to increased stressors.  No alarm signs or symptoms.  Will increase sertraline to 50 mg daily.  Follow-up 4 to 6 weeks for reassessment.  Sooner if needed.  This visit occurred during the SARS-CoV-2 public health emergency.  Safety protocols were in place, including screening questions prior to the visit, additional usage of staff PPE, and extensive cleaning of exam room while observing appropriate contact time as indicated for disinfecting solutions.     Leeanne Rio, PA-C

## 2019-08-29 ENCOUNTER — Other Ambulatory Visit (INDEPENDENT_AMBULATORY_CARE_PROVIDER_SITE_OTHER): Payer: 59

## 2019-08-29 DIAGNOSIS — R7309 Other abnormal glucose: Secondary | ICD-10-CM

## 2019-08-29 LAB — HEMOGLOBIN A1C: Hgb A1c MFr Bld: 5.6 % (ref 4.6–6.5)

## 2019-09-02 ENCOUNTER — Other Ambulatory Visit: Payer: Self-pay | Admitting: Physician Assistant

## 2019-09-02 MED ORDER — TRIAMTERENE-HCTZ 37.5-25 MG PO TABS: 1.0000 | ORAL_TABLET | Freq: Every day | ORAL | 0 refills | Status: DC

## 2019-09-02 MED ORDER — BUPROPION HCL ER (XL) 300 MG PO TB24: 300.0000 mg | ORAL_TABLET | Freq: Every day | ORAL | 0 refills | Status: DC

## 2019-09-02 MED ORDER — SERTRALINE HCL 50 MG PO TABS: 50.0000 mg | ORAL_TABLET | Freq: Every day | ORAL | 0 refills | Status: DC

## 2019-09-17 ENCOUNTER — Other Ambulatory Visit: Payer: Self-pay | Admitting: Physician Assistant

## 2019-10-22 ENCOUNTER — Other Ambulatory Visit: Payer: Self-pay | Admitting: Physician Assistant

## 2019-11-05 ENCOUNTER — Other Ambulatory Visit: Payer: Self-pay | Admitting: Physician Assistant

## 2019-11-07 ENCOUNTER — Other Ambulatory Visit: Payer: Self-pay | Admitting: Physician Assistant

## 2019-12-13 ENCOUNTER — Other Ambulatory Visit: Payer: Self-pay | Admitting: Physician Assistant

## 2020-01-06 ENCOUNTER — Other Ambulatory Visit: Payer: Self-pay

## 2020-01-06 ENCOUNTER — Encounter: Payer: 59 | Admitting: Physician Assistant

## 2020-01-06 ENCOUNTER — Ambulatory Visit (INDEPENDENT_AMBULATORY_CARE_PROVIDER_SITE_OTHER): Payer: PPO | Admitting: Physician Assistant

## 2020-01-06 ENCOUNTER — Encounter: Payer: Self-pay | Admitting: Physician Assistant

## 2020-01-06 VITALS — BP 110/80 | HR 59 | Temp 98.2°F | Resp 16 | Ht 71.0 in | Wt 233.0 lb

## 2020-01-06 DIAGNOSIS — E7800 Pure hypercholesterolemia, unspecified: Secondary | ICD-10-CM | POA: Diagnosis not present

## 2020-01-06 DIAGNOSIS — I10 Essential (primary) hypertension: Secondary | ICD-10-CM | POA: Diagnosis not present

## 2020-01-06 DIAGNOSIS — F419 Anxiety disorder, unspecified: Secondary | ICD-10-CM

## 2020-01-06 DIAGNOSIS — Z125 Encounter for screening for malignant neoplasm of prostate: Secondary | ICD-10-CM

## 2020-01-06 DIAGNOSIS — Z23 Encounter for immunization: Secondary | ICD-10-CM

## 2020-01-06 DIAGNOSIS — Z0000 Encounter for general adult medical examination without abnormal findings: Secondary | ICD-10-CM

## 2020-01-06 DIAGNOSIS — N401 Enlarged prostate with lower urinary tract symptoms: Secondary | ICD-10-CM | POA: Diagnosis not present

## 2020-01-06 DIAGNOSIS — F32A Depression, unspecified: Secondary | ICD-10-CM | POA: Diagnosis not present

## 2020-01-06 LAB — CBC WITH DIFFERENTIAL/PLATELET
Basophils Absolute: 0.1 10*3/uL (ref 0.0–0.1)
Basophils Relative: 0.9 % (ref 0.0–3.0)
Eosinophils Absolute: 0.3 10*3/uL (ref 0.0–0.7)
Eosinophils Relative: 4.1 % (ref 0.0–5.0)
HCT: 43.9 % (ref 39.0–52.0)
Hemoglobin: 14.9 g/dL (ref 13.0–17.0)
Lymphocytes Relative: 37 % (ref 12.0–46.0)
Lymphs Abs: 3 10*3/uL (ref 0.7–4.0)
MCHC: 34 g/dL (ref 30.0–36.0)
MCV: 93.5 fl (ref 78.0–100.0)
Monocytes Absolute: 0.6 10*3/uL (ref 0.1–1.0)
Monocytes Relative: 7.2 % (ref 3.0–12.0)
Neutro Abs: 4.1 10*3/uL (ref 1.4–7.7)
Neutrophils Relative %: 50.8 % (ref 43.0–77.0)
Platelets: 203 10*3/uL (ref 150.0–400.0)
RBC: 4.69 Mil/uL (ref 4.22–5.81)
RDW: 13.1 % (ref 11.5–15.5)
WBC: 8.1 10*3/uL (ref 4.0–10.5)

## 2020-01-06 LAB — COMPREHENSIVE METABOLIC PANEL
ALT: 29 U/L (ref 0–53)
AST: 19 U/L (ref 0–37)
Albumin: 4.4 g/dL (ref 3.5–5.2)
Alkaline Phosphatase: 79 U/L (ref 39–117)
BUN: 15 mg/dL (ref 6–23)
CO2: 30 mEq/L (ref 19–32)
Calcium: 9.5 mg/dL (ref 8.4–10.5)
Chloride: 101 mEq/L (ref 96–112)
Creatinine, Ser: 1.08 mg/dL (ref 0.40–1.50)
GFR: 71.66 mL/min (ref >60.00)
Glucose, Bld: 90 mg/dL (ref 70–99)
Potassium: 4.2 mEq/L (ref 3.5–5.1)
Sodium: 139 mEq/L (ref 135–145)
Total Bilirubin: 0.7 mg/dL (ref 0.2–1.2)
Total Protein: 6.8 g/dL (ref 6.0–8.3)

## 2020-01-06 LAB — LIPID PANEL
Cholesterol: 194 mg/dL (ref 0–200)
HDL: 46.9 mg/dL (ref >39.00)
LDL Cholesterol: 118 mg/dL — ABNORMAL HIGH (ref 0–99)
NonHDL: 146.93
Total CHOL/HDL Ratio: 4
Triglycerides: 146 mg/dL (ref 0.0–149.0)
VLDL: 29.2 mg/dL (ref 0.0–40.0)

## 2020-01-06 LAB — PSA: PSA: 0.09 ng/mL — ABNORMAL LOW (ref 0.10–4.00)

## 2020-01-06 LAB — HEMOGLOBIN A1C: Hgb A1c MFr Bld: 5.7 % (ref 4.6–6.5)

## 2020-01-06 MED ORDER — SERTRALINE HCL 50 MG PO TABS: 50.0000 mg | ORAL_TABLET | Freq: Every day | ORAL | 1 refills | Status: DC

## 2020-01-06 MED ORDER — SILDENAFIL CITRATE 20 MG PO TABS: 60.0000 mg | ORAL_TABLET | Freq: Every day | ORAL | 5 refills | Status: DC

## 2020-01-06 NOTE — Patient Instructions (Signed)
Please go to the lab for blood work.   Our office will call you with your results unless you have chosen to receive results via MyChart.  If your blood work is normal we will follow-up each year for physicals and as scheduled for chronic medical problems.  If anything is abnormal we will treat accordingly and get you in for a follow-up.  Increase the Sildenafil to 60 mg per dose. No more than 1 dose in 24 hour period.   Preventive Care 66 Years and Older, Male Preventive care refers to lifestyle choices and visits with your health care provider that can promote health and wellness. This includes:  A yearly physical exam. This is also called an annual well check.  Regular dental and eye exams.  Immunizations.  Screening for certain conditions.  Healthy lifestyle choices, such as diet and exercise. What can I expect for my preventive care visit? Physical exam Your health care provider will check:  Height and weight. These may be used to calculate body mass index (BMI), which is a measurement that tells if you are at a healthy weight.  Heart rate and blood pressure.  Your skin for abnormal spots. Counseling Your health care provider may ask you questions about:  Alcohol, tobacco, and drug use.  Emotional well-being.  Home and relationship well-being.  Sexual activity.  Eating habits.  History of falls.  Memory and ability to understand (cognition).  Work and work Statistician. What immunizations do I need?  Influenza (flu) vaccine  This is recommended every year. Tetanus, diphtheria, and pertussis (Tdap) vaccine  You may need a Td booster every 10 years. Varicella (chickenpox) vaccine  You may need this vaccine if you have not already been vaccinated. Zoster (shingles) vaccine  You may need this after age 7. Pneumococcal conjugate (PCV13) vaccine  One dose is recommended after age 80. Pneumococcal polysaccharide (PPSV23) vaccine  One dose is  recommended after age 69. Measles, mumps, and rubella (MMR) vaccine  You may need at least one dose of MMR if you were born in 1957 or later. You may also need a second dose. Meningococcal conjugate (MenACWY) vaccine  You may need this if you have certain conditions. Hepatitis A vaccine  You may need this if you have certain conditions or if you travel or work in places where you may be exposed to hepatitis A. Hepatitis B vaccine  You may need this if you have certain conditions or if you travel or work in places where you may be exposed to hepatitis B. Haemophilus influenzae type b (Hib) vaccine  You may need this if you have certain conditions. You may receive vaccines as individual doses or as more than one vaccine together in one shot (combination vaccines). Talk with your health care provider about the risks and benefits of combination vaccines. What tests do I need? Blood tests  Lipid and cholesterol levels. These may be checked every 5 years, or more frequently depending on your overall health.  Hepatitis C test.  Hepatitis B test. Screening  Lung cancer screening. You may have this screening every year starting at age 26 if you have a 30-pack-year history of smoking and currently smoke or have quit within the past 15 years.  Colorectal cancer screening. All adults should have this screening starting at age 52 and continuing until age 36. Your health care provider may recommend screening at age 55 if you are at increased risk. You will have tests every 1-10 years, depending on your results  and the type of screening test.  Prostate cancer screening. Recommendations will vary depending on your family history and other risks.  Diabetes screening. This is done by checking your blood sugar (glucose) after you have not eaten for a while (fasting). You may have this done every 1-3 years.  Abdominal aortic aneurysm (AAA) screening. You may need this if you are a current or former  smoker.  Sexually transmitted disease (STD) testing. Follow these instructions at home: Eating and drinking  Eat a diet that includes fresh fruits and vegetables, whole grains, lean protein, and low-fat dairy products. Limit your intake of foods with high amounts of sugar, saturated fats, and salt.  Take vitamin and mineral supplements as recommended by your health care provider.  Do not drink alcohol if your health care provider tells you not to drink.  If you drink alcohol: ? Limit how much you have to 0-2 drinks a day. ? Be aware of how much alcohol is in your drink. In the [place] one drink equals one 12 oz bottle of beer (355 mL), one 5 oz glass of wine (148 mL), or one 1 oz glass of hard liquor (44 mL). Lifestyle  Take daily care of your teeth and gums.  Stay active. Exercise for at least 30 minutes on 5 or more days each week.  Do not use any products that contain nicotine or tobacco, such as cigarettes, e-cigarettes, and chewing tobacco. If you need help quitting, ask your health care provider.  If you are sexually active, practice safe sex. Use a condom or other form of protection to prevent STIs (sexually transmitted infections).  Talk with your health care provider about taking a low-dose aspirin or statin. What's next?  Visit your health care provider once a year for a well check visit.  Ask your health care provider how often you should have your eyes and teeth checked.  Stay up to date on all vaccines. This information is not intended to replace advice given to you by your health care provider. Make sure you discuss any questions you have with your health care provider. Document Revised: 01/11/2018 Document Reviewed: 01/11/2018 Elsevier Patient Education  2020 [employer].

## 2020-01-06 NOTE — Progress Notes (Signed)
Patient presents to clinic today for annual exam.  Patient is fasting for labs.  Acute Concerns: Patient with history of BPH and erectile dysfunction, currently on a regimen of finasteride 5 mg daily and sildenafil 20 to 40 mg as needed for erectile dysfunction.  Patient notes urinary symptoms are completely resolved with use of his finasteride.  Is noting that 40 mg of sildenafil is not working as well for him.  Would like to discuss adjustment in dose.  Chronic Issues: Hypertension --patient is currently on a regimen of metoprolol and triamterene-hydrochlorothiazide daily.  Endorses taking as directed and tolerating well. Patient denies chest pain, palpitations, lightheadedness, dizziness, vision changes or frequent headaches.  BP Readings from Last 3 Encounters:  01/06/20 110/80  08/27/19 (!) 130/80  04/24/19 (!) 111/57   Hyperlipidemia --patient currently on a regimen of Livalo 4 mg daily.  Is taking as directed.  Is trying to keep a well-balanced diet and staying active.  Notes diet has been better since he retired earlier this year.  Anxiety and depression --on a combination of sertraline 50 mg daily and Wellbutrin 300 mg daily.  Is taking as directed and tolerating well.  Notes substantial improvement in mood with this regimen.  Denies breakthrough symptoms.  Health Maintenance: Immunizations --due for influenza vaccine and Pneumovax.  Agrees to these today. Colon Cancer Screening --up-to-date  Past Medical History:  Diagnosis Date  . Allergy   . Bulging lumbar disc   . Cataract   . Colon polyps   . Depression   . GERD (gastroesophageal reflux disease)   . Hyperlipidemia   . Hypertension   . Spinal stenosis     Past Surgical History:  Procedure Laterality Date  . broken nose   1974  . COLONOSCOPY    . I & D EXTREMITY Left 12/18/2012   Procedure: I&D Left Middle Finger With Repair Recons.Prn;  Surgeon: Roseanne Kaufman, MD;  Location: [facility];  Service: Orthopedics;   Laterality: Left;  . I & D EXTREMITY Left 12/20/2012   Procedure: LEFT HAND Incision and drainage  TO INCLUDE MIDDLE FINGER FLEXOR TENDON;  Surgeon: Roseanne Kaufman, MD;  Location: [facility];  Service: Orthopedics;  Laterality: Left;  . I & D EXTREMITY Left 12/22/2012   Procedure: IRRIGATION AND DEBRIDEMENT HAND;  Surgeon: Roseanne Kaufman, MD;  Location: [facility];  Service: Orthopedics;  Laterality: Left;  . REFRACTIVE SURGERY    . TYMPANIC MEMBRANE REPAIR    . WISDOM TOOTH EXTRACTION      Current Outpatient Medications on File Prior to Visit  Medication Sig Dispense Refill  . buPROPion (WELLBUTRIN XL) 300 MG 24 hr tablet TAKE 1 TABLET BY MOUTH EVERY DAY 90 tablet 1  . carboxymethylcellulose 1 % ophthalmic solution 1 drop 2 times daily.    . finasteride (PROSCAR) 5 MG tablet Take 5 mg by mouth daily.  3  . LIVALO 4 MG TABS TAKE 1 TABLET BY MOUTH EVERY DAY 90 tablet 1  . metoprolol tartrate (LOPRESSOR) 50 MG tablet TAKE 1 TABLET BY MOUTH TWICE A DAY 180 tablet 0  . Multiple Vitamins-Minerals (MULTIVITAMIN WITH MINERALS) tablet Take by mouth.    Marland Kitchen omeprazole (PRILOSEC) 20 MG capsule TAKE 1 CAPSULE BY MOUTH EVERY DAY 90 capsule 1  . sertraline (ZOLOFT) 50 MG tablet Take 1 tablet (50 mg total) by mouth daily. 14 tablet 0  . sildenafil (REVATIO) 20 MG tablet TAKE 1 TABLET (20 MG TOTAL) 3 (THREE) TIMES DAILY BY MOUTH. 10 tablet 5  .  triamterene-hydrochlorothiazide (MAXZIDE-25) 37.5-25 MG tablet TAKE 1 TABLET BY MOUTH EVERY DAY 90 tablet 1   No current facility-administered medications on file prior to visit.    No Known Allergies  Family History  Problem Relation Age of Onset  . Hypertension Father 54       Deceased  . Neuropathy Father   . Kidney cancer Mother 54       Deceased  . Kidney disease Mother   . Kidney cancer Maternal Grandmother   . Cancer Other        Aunts & Uncles  . Colon cancer Other   . Diabetes Brother   . Breast cancer Sister   . Healthy Brother        x1  . Healthy  Daughter        x2  . Esophageal cancer Neg Hx   . Rectal cancer Neg Hx   . Stomach cancer Neg Hx     Social History   Socioeconomic History  . Marital status: Single    Spouse name: Not on file  . Number of children: 2  . Years of education: Not on file  . Highest education level: Not on file  Occupational History  . Occupation: CFO  Tobacco Use  . Smoking status: Former Research scientist (life sciences)  . Smokeless tobacco: Never Used  Vaping Use  . Vaping Use: Never used  Substance and Sexual Activity  . Alcohol use: Yes    Alcohol/week: 0.0 standard drinks    Comment: social  . Drug use: No  . Sexual activity: Yes  Other Topics Concern  . Not on file  Social History Narrative  . Not on file   Social Determinants of Health   Financial Resource Strain:   . Difficulty of Paying Living Expenses: Not on file  Food Insecurity:   . Worried About Charity fundraiser in the Last Year: Not on file  . Ran Out of Food in the Last Year: Not on file  Transportation Needs:   . Lack of Transportation (Medical): Not on file  . Lack of Transportation (Non-Medical): Not on file  Physical Activity:   . Days of Exercise per Week: Not on file  . Minutes of Exercise per Session: Not on file  Stress:   . Feeling of Stress : Not on file  Social Connections:   . Frequency of Communication with Friends and Family: Not on file  . Frequency of Social Gatherings with Friends and Family: Not on file  . Attends Religious Services: Not on file  . Active Member of Clubs or Organizations: Not on file  . Attends Archivist Meetings: Not on file  . Marital Status: Not on file  Intimate Partner Violence:   . Fear of Current or Ex-Partner: Not on file  . Emotionally Abused: Not on file  . Physically Abused: Not on file  . Sexually Abused: Not on file   Review of Systems  Constitutional: Negative for fever and weight loss.  HENT: Negative for ear discharge, ear pain, hearing loss and tinnitus.   Eyes:  Negative for blurred vision, double vision, photophobia and pain.  Respiratory: Negative for cough and shortness of breath.   Cardiovascular: Negative for chest pain and palpitations.  Gastrointestinal: Negative for abdominal pain, blood in stool, constipation, diarrhea, heartburn, melena, nausea and vomiting.  Genitourinary: Negative for dysuria, flank pain, frequency, hematuria and urgency.  Musculoskeletal: Negative for falls.  Neurological: Negative for dizziness, loss of consciousness and headaches.  Endo/Heme/Allergies: Negative  for environmental allergies.  Psychiatric/Behavioral: Negative for depression, hallucinations, substance abuse and suicidal ideas. The patient is not nervous/anxious and does not have insomnia.     BP 110/80   Pulse (!) 59   Temp 98.2 F (36.8 C) (Temporal)   Resp 16   Ht 5\' 11"  (1.803 m)   Wt 233 lb (105.7 kg)   SpO2 99%   BMI 32.50 kg/m   Physical Exam Vitals reviewed.  Constitutional:      General: He is not in acute distress.    Appearance: He is well-developed. He is not diaphoretic.  HENT:     Head: Normocephalic and atraumatic.     Right Ear: Tympanic membrane, ear canal and external ear normal.     Left Ear: Tympanic membrane, ear canal and external ear normal.     Nose: Nose normal.     Mouth/Throat:     Pharynx: No posterior oropharyngeal erythema.  Eyes:     Conjunctiva/sclera: Conjunctivae normal.     Pupils: Pupils are equal, round, and reactive to light.  Neck:     Thyroid: No thyromegaly.  Cardiovascular:     Rate and Rhythm: Normal rate and regular rhythm.     Heart sounds: Normal heart sounds.  Pulmonary:     Effort: Pulmonary effort is normal. No respiratory distress.     Breath sounds: Normal breath sounds. No wheezing or rales.  Chest:     Chest wall: No tenderness.  Abdominal:     General: Bowel sounds are normal. There is no distension.     Palpations: Abdomen is soft. There is no mass.     Tenderness: There is no  abdominal tenderness. There is no guarding or rebound.  Musculoskeletal:     Cervical back: Neck supple.  Lymphadenopathy:     Cervical: No cervical adenopathy.  Skin:    General: Skin is warm and dry.     Findings: No rash.  Neurological:     Mental Status: He is alert and oriented to person, place, and time.     Cranial Nerves: No cranial nerve deficit.    Assessment/Plan: 1. Visit for preventive health examination Depression screen negative. Health Maintenance reviewed. Preventive schedule discussed and handout given in AVS. Will obtain fasting labs today.  - CBC with Differential/Platelet  2. Essential hypertension BP normotensive.  Asymptomatic.  Continue current regimen.  Dietary and exercise recommendations reviewed with patient.  Repeat fasting labs today. - Comprehensive metabolic panel - Lipid panel  3. Pure hypercholesterolemia Taking medication as directed.  Dietary and exercise recommendations reviewed.  Repeat fasting labs today. - Comprehensive metabolic panel - Lipid panel - Hemoglobin A1c  4. Anxiety and depression Doing very well.  Continue current regimen. - sertraline (ZOLOFT) 50 MG tablet; Take 1 tablet (50 mg total) by mouth daily.  Dispense: 90 tablet; Refill: 1  5. Benign prostatic hyperplasia with lower urinary tract symptoms, symptom details unspecified Notes doing very well with his current regimen of finasteride.  Some decline in erectile function.  Will check PSA today.  Continue finasteride at current dose.  Increase sildenafil to 60 mg per dose. - PSA  6. Prostate cancer screening He indicates understanding of the limitations of this screening test and wishes to proceed with screening PSA testing.  - PSA  7. Need for pneumococcal vaccination - Pneumococcal polysaccharide vaccine 23-valent greater than or equal to 2yo subcutaneous/IM  8. Need for immunization against influenza - Flu Vaccine QUAD High Dose(Fluad)  This visit occurred  during the SARS-CoV-2 public health emergency.  Safety protocols were in place, including screening questions prior to the visit, additional usage of staff PPE, and extensive cleaning of exam room while observing appropriate contact time as indicated for disinfecting solutions.    Leeanne Rio, PA-C

## 2020-01-07 ENCOUNTER — Encounter: Payer: Self-pay | Admitting: Physician Assistant

## 2020-02-23 ENCOUNTER — Other Ambulatory Visit: Payer: Self-pay | Admitting: Physician Assistant

## 2020-03-28 ENCOUNTER — Other Ambulatory Visit: Payer: Self-pay | Admitting: Physician Assistant

## 2020-04-21 ENCOUNTER — Other Ambulatory Visit: Payer: Self-pay | Admitting: Physician Assistant

## 2020-04-21 DIAGNOSIS — N401 Enlarged prostate with lower urinary tract symptoms: Secondary | ICD-10-CM | POA: Diagnosis not present

## 2020-04-21 DIAGNOSIS — R351 Nocturia: Secondary | ICD-10-CM | POA: Diagnosis not present

## 2020-04-21 DIAGNOSIS — R350 Frequency of micturition: Secondary | ICD-10-CM | POA: Diagnosis not present

## 2020-04-21 DIAGNOSIS — N5201 Erectile dysfunction due to arterial insufficiency: Secondary | ICD-10-CM | POA: Diagnosis not present

## 2020-04-24 NOTE — Telephone Encounter (Signed)
Pt called in asking for an update on this,   Please advise

## 2020-06-22 ENCOUNTER — Other Ambulatory Visit: Payer: Self-pay | Admitting: Family

## 2020-06-22 DIAGNOSIS — F32A Depression, unspecified: Secondary | ICD-10-CM

## 2020-06-24 NOTE — Telephone Encounter (Signed)
Patient needs Sertaline and Metoprolol - [facility] on  [address]

## 2020-07-07 ENCOUNTER — Other Ambulatory Visit: Payer: Self-pay | Admitting: Family Medicine

## 2020-07-31 ENCOUNTER — Other Ambulatory Visit: Payer: Self-pay

## 2020-07-31 ENCOUNTER — Other Ambulatory Visit: Payer: Self-pay | Admitting: Family Medicine

## 2020-07-31 DIAGNOSIS — E7800 Pure hypercholesterolemia, unspecified: Secondary | ICD-10-CM

## 2020-07-31 MED ORDER — LIVALO 4 MG PO TABS: 1.0000 | ORAL_TABLET | Freq: Every day | ORAL | 0 refills | Status: DC

## 2020-08-23 ENCOUNTER — Other Ambulatory Visit: Payer: Self-pay | Admitting: Family Medicine

## 2020-08-23 DIAGNOSIS — F32A Depression, unspecified: Secondary | ICD-10-CM

## 2020-08-23 DIAGNOSIS — F419 Anxiety disorder, unspecified: Secondary | ICD-10-CM

## 2020-09-07 ENCOUNTER — Other Ambulatory Visit: Payer: Self-pay | Admitting: Family Medicine

## 2020-09-17 ENCOUNTER — Telehealth: Payer: Self-pay | Admitting: Physician Assistant

## 2020-09-17 NOTE — Chronic Care Management (AMB) (Signed)
  Chronic Care Management   Outreach Note  09/17/2020 Name: Antonio Cooper MRN: [id number] DOB: 1955-03-17  Referred by: Brunetta Jeans, PA-C Reason for referral : No chief complaint on file.   An unsuccessful telephone outreach was attempted today. The patient was referred to the pharmacist for assistance with care management and care coordination.   Follow Up Plan:   Tatjana Dellinger Upstream Scheduler

## 2020-09-21 DIAGNOSIS — L820 Inflamed seborrheic keratosis: Secondary | ICD-10-CM | POA: Diagnosis not present

## 2020-09-21 DIAGNOSIS — D2372 Other benign neoplasm of skin of left lower limb, including hip: Secondary | ICD-10-CM | POA: Diagnosis not present

## 2020-09-21 DIAGNOSIS — L821 Other seborrheic keratosis: Secondary | ICD-10-CM | POA: Diagnosis not present

## 2020-09-21 DIAGNOSIS — D2272 Melanocytic nevi of left lower limb, including hip: Secondary | ICD-10-CM | POA: Diagnosis not present

## 2020-09-21 DIAGNOSIS — D225 Melanocytic nevi of trunk: Secondary | ICD-10-CM | POA: Diagnosis not present

## 2020-09-21 DIAGNOSIS — L249 Irritant contact dermatitis, unspecified cause: Secondary | ICD-10-CM | POA: Diagnosis not present

## 2020-09-21 DIAGNOSIS — D2271 Melanocytic nevi of right lower limb, including hip: Secondary | ICD-10-CM | POA: Diagnosis not present

## 2020-09-21 DIAGNOSIS — D2261 Melanocytic nevi of right upper limb, including shoulder: Secondary | ICD-10-CM | POA: Diagnosis not present

## 2020-09-21 DIAGNOSIS — D2371 Other benign neoplasm of skin of right lower limb, including hip: Secondary | ICD-10-CM | POA: Diagnosis not present

## 2020-09-21 DIAGNOSIS — D224 Melanocytic nevi of scalp and neck: Secondary | ICD-10-CM | POA: Diagnosis not present

## 2020-09-21 DIAGNOSIS — L719 Rosacea, unspecified: Secondary | ICD-10-CM | POA: Diagnosis not present

## 2020-09-21 DIAGNOSIS — D2230 Melanocytic nevi of unspecified part of face: Secondary | ICD-10-CM | POA: Diagnosis not present

## 2020-09-25 ENCOUNTER — Other Ambulatory Visit: Payer: Self-pay | Admitting: Family Medicine

## 2020-09-25 ENCOUNTER — Telehealth: Payer: Self-pay | Admitting: Physician Assistant

## 2020-09-25 DIAGNOSIS — F32A Depression, unspecified: Secondary | ICD-10-CM

## 2020-09-25 NOTE — Progress Notes (Signed)
  Chronic Care Management   Note  09/25/2020 Name: NEEKO GAYHART MRN: [id number] DOB: 06-Oct-1955  Lauretta Grill is a 65 y.o. year old male who is a primary care patient of Delorse Limber. I reached out to Lauretta Grill by phone today in response to a referral sent by Mr. Coppock PCP, Brunetta Jeans, PA-C.   Mr. Hovel was given information about Chronic Care Management services today including:  CCM service includes personalized support from designated clinical staff supervised by his physician, including individualized plan of care and coordination with other care providers 24/7 contact phone numbers for assistance for urgent and routine care needs. Service will only be billed when office clinical staff spend 20 minutes or more in a month to coordinate care. Only one practitioner may furnish and bill the service in a calendar month. The patient may stop CCM services at any time (effective at the end of the month) by phone call to the office staff.  Patient agreed to services and verbal consent obtained.    Follow up plan:   Tatjana Secretary/administrator

## 2020-10-09 ENCOUNTER — Ambulatory Visit (INDEPENDENT_AMBULATORY_CARE_PROVIDER_SITE_OTHER): Payer: PPO | Admitting: Registered Nurse

## 2020-10-09 ENCOUNTER — Other Ambulatory Visit: Payer: Self-pay

## 2020-10-09 ENCOUNTER — Encounter: Payer: Self-pay | Admitting: Registered Nurse

## 2020-10-09 VITALS — BP 128/80 | HR 64 | Temp 98.2°F | Resp 17 | Ht 71.0 in | Wt 245.6 lb

## 2020-10-09 DIAGNOSIS — Z1329 Encounter for screening for other suspected endocrine disorder: Secondary | ICD-10-CM

## 2020-10-09 DIAGNOSIS — I10 Essential (primary) hypertension: Secondary | ICD-10-CM

## 2020-10-09 DIAGNOSIS — E7800 Pure hypercholesterolemia, unspecified: Secondary | ICD-10-CM

## 2020-10-09 DIAGNOSIS — Z13220 Encounter for screening for lipoid disorders: Secondary | ICD-10-CM | POA: Diagnosis not present

## 2020-10-09 DIAGNOSIS — Z23 Encounter for immunization: Secondary | ICD-10-CM | POA: Diagnosis not present

## 2020-10-09 DIAGNOSIS — Z130 Encounter for screening for diseases of the blood and blood-forming organs and certain disorders involving the immune mechanism: Secondary | ICD-10-CM

## 2020-10-09 DIAGNOSIS — Z125 Encounter for screening for malignant neoplasm of prostate: Secondary | ICD-10-CM | POA: Diagnosis not present

## 2020-10-09 DIAGNOSIS — Z13228 Encounter for screening for other metabolic disorders: Secondary | ICD-10-CM | POA: Diagnosis not present

## 2020-10-09 NOTE — Patient Instructions (Signed)
Mr. Antonio Cooper to meet you  Labs within the next few weeks  Let me know if you need refills  See you in 6 mo, sooner if concerns arise  Thank you  Denice Paradise

## 2020-10-09 NOTE — Progress Notes (Signed)
Established Patient Office Visit  Subjective:  Patient ID: Antonio Cooper, male    DOB: [birth year]  Age: 65 y.o. MRN: [id number]  CC:  Chief Complaint  Patient presents with   Transitions Of Care    HPI Antonio Cooper presents for toc  Formerly pt of Elyn Aquas  Histories reviewed and updated with patient.   Hypertension: Patient Currently taking: metoprolol '50mg'$  po qd, triamterene-hctz 37.5-'25mg'$  po qd Good effect. No AEs. Denies CV symptoms including: chest pain, shob, doe, headache, visual changes, fatigue, claudication, and dependent edema.   Previous readings and labs: BP Readings from Last 3 Encounters:  10/09/20 128/80  01/06/20 110/80  08/27/19 (!) 130/80   Lab Results  Component Value Date   CREATININE 1.08 01/06/2020     Hld Taking pitavasatatin '4mg'$  po qd  Good effect, no AE Hopes to continue  Anxiety and depression Sertraline '50mg'$  po qd Good effect, no AE Hopes to continue  ED Uses sildenafil '20mg'$  po qd prn Good effect, no AE, hopes to continue.  Past Medical History:  Diagnosis Date   Allergy    Bulging lumbar disc    Cataract    Colon polyps    Depression    GERD (gastroesophageal reflux disease)    Hyperlipidemia    Hypertension    Spinal stenosis     Past Surgical History:  Procedure Laterality Date   broken nose   1974   COLONOSCOPY     I & D EXTREMITY Left 12/18/2012   Procedure: I&D Left Middle Finger With Repair Recons.Prn;  Surgeon: Roseanne Kaufman, MD;  Location: [facility];  Service: Orthopedics;  Laterality: Left;   I & D EXTREMITY Left 12/20/2012   Procedure: LEFT HAND Incision and drainage  TO INCLUDE MIDDLE FINGER FLEXOR TENDON;  Surgeon: Roseanne Kaufman, MD;  Location: [facility];  Service: Orthopedics;  Laterality: Left;   I & D EXTREMITY Left 12/22/2012   Procedure: IRRIGATION AND DEBRIDEMENT HAND;  Surgeon: Roseanne Kaufman, MD;  Location: [facility];  Service: Orthopedics;  Laterality: Left;   REFRACTIVE SURGERY     TYMPANIC  MEMBRANE REPAIR     WISDOM TOOTH EXTRACTION      Family History  Problem Relation Age of Onset   Hypertension Father 23       Deceased   Neuropathy Father    Kidney cancer Mother 71       Deceased   Kidney disease Mother    Kidney cancer Maternal Grandmother    Cancer Other        Aunts & Uncles   Colon cancer Other    Diabetes Brother    Breast cancer Sister    Healthy Brother        x1   Healthy Daughter        x2   Esophageal cancer Neg Hx    Rectal cancer Neg Hx    Stomach cancer Neg Hx     Social History   Socioeconomic History   Marital status: Single    Spouse name: Not on file   Number of children: 2   Years of education: Not on file   Highest education level: Not on file  Occupational History   Occupation: CFO  Tobacco Use   Smoking status: Former   Smokeless tobacco: Never  Scientific laboratory technician Use: Never used  Substance and Sexual Activity   Alcohol use: Yes    Alcohol/week: 0.0 standard drinks    Comment: social  Drug use: No   Sexual activity: Yes  Other Topics Concern   Not on file  Social History Narrative   Not on file   Social Determinants of Health   Financial Resource Strain: Not on file  Food Insecurity: Not on file  Transportation Needs: Not on file  Physical Activity: Not on file  Stress: Not on file  Social Connections: Not on file  Intimate Partner Violence: Not on file    Outpatient Medications Prior to Visit  Medication Sig Dispense Refill   buPROPion (WELLBUTRIN XL) 300 MG 24 hr tablet TAKE 1 TABLET BY MOUTH EVERY DAY 90 tablet 0   carboxymethylcellulose 1 % ophthalmic solution 1 drop 2 times daily.     finasteride (PROSCAR) 5 MG tablet Take 5 mg by mouth daily.  3   metoprolol tartrate (LOPRESSOR) 50 MG tablet TAKE 1 TABLET BY MOUTH TWICE A DAY 180 tablet 0   Multiple Vitamins-Minerals (MULTIVITAMIN WITH MINERALS) tablet Take by mouth.     omeprazole (PRILOSEC) 20 MG capsule TAKE 1 CAPSULE BY MOUTH EVERY DAY 90 capsule  0   Pitavastatin Calcium (LIVALO) 4 MG TABS Take 1 tablet (4 mg total) by mouth daily. 90 tablet 0   sertraline (ZOLOFT) 50 MG tablet TAKE 1 TABLET BY MOUTH EVERY DAY 90 tablet 0   sildenafil (REVATIO) 20 MG tablet TAKE 1 TABLET (20 MG TOTAL) 3 (THREE) TIMES DAILY BY MOUTH. 10 tablet 5   triamterene-hydrochlorothiazide (MAXZIDE-25) 37.5-25 MG tablet TAKE 1 TABLET BY MOUTH EVERY DAY 90 tablet 0   No facility-administered medications prior to visit.    No Known Allergies  ROS Review of Systems  Constitutional: Negative.   HENT: Negative.    Eyes: Negative.   Respiratory: Negative.    Cardiovascular: Negative.   Gastrointestinal: Negative.   Genitourinary: Negative.   Musculoskeletal: Negative.   Skin: Negative.   Neurological: Negative.   Psychiatric/Behavioral: Negative.    All other systems reviewed and are negative.    Objective:    Physical Exam Constitutional:      General: He is not in acute distress.    Appearance: Normal appearance. He is normal weight. He is not ill-appearing, toxic-appearing or diaphoretic.  Cardiovascular:     Rate and Rhythm: Normal rate and regular rhythm.     Heart sounds: Normal heart sounds. No murmur heard.   No friction rub. No gallop.  Pulmonary:     Effort: Pulmonary effort is normal. No respiratory distress.     Breath sounds: Normal breath sounds. No stridor. No wheezing, rhonchi or rales.  Chest:     Chest wall: No tenderness.  Neurological:     General: No focal deficit present.     Mental Status: He is alert and oriented to person, place, and time. Mental status is at baseline.  Psychiatric:        Mood and Affect: Mood normal.        Behavior: Behavior normal.        Thought Content: Thought content normal.        Judgment: Judgment normal.    BP 128/80   Pulse 64   Temp 98.2 F (36.8 C) (Temporal)   Resp 17   Ht '5\' 11"'$  (1.803 m)   Wt 245 lb 9.6 oz (111.4 kg)   SpO2 98%   BMI 34.25 kg/m  Wt Readings from Last 3  Encounters:  10/09/20 245 lb 9.6 oz (111.4 kg)  01/06/20 233 lb (105.7 kg)  08/27/19 (!) 236  lb (107 kg)     Health Maintenance Due  Topic Date Due   COVID-19 Vaccine (4 - Booster for Pfizer series) 05/20/2020    There are no preventive care reminders to display for this patient.  Lab Results  Component Value Date   TSH 1.46 10/28/2015   Lab Results  Component Value Date   WBC 8.1 01/06/2020   HGB 14.9 01/06/2020   HCT 43.9 01/06/2020   MCV 93.5 01/06/2020   PLT 203.0 01/06/2020   Lab Results  Component Value Date   NA 139 01/06/2020   K 4.2 01/06/2020   CO2 30 01/06/2020   GLUCOSE 90 01/06/2020   BUN 15 01/06/2020   CREATININE 1.08 01/06/2020   BILITOT 0.7 01/06/2020   ALKPHOS 79 01/06/2020   AST 19 01/06/2020   ALT 29 01/06/2020   PROT 6.8 01/06/2020   ALBUMIN 4.4 01/06/2020   CALCIUM 9.5 01/06/2020   GFR 71.66 01/06/2020   Lab Results  Component Value Date   CHOL 194 01/06/2020   Lab Results  Component Value Date   HDL 46.90 01/06/2020   Lab Results  Component Value Date   LDLCALC 118 (H) 01/06/2020   Lab Results  Component Value Date   TRIG 146.0 01/06/2020   Lab Results  Component Value Date   CHOLHDL 4 01/06/2020   Lab Results  Component Value Date   HGBA1C 5.7 01/06/2020      Assessment & Plan:   Problem List Items Addressed This Visit       Cardiovascular and Mediastinum   Essential hypertension   Relevant Orders   CBC with Differential/Platelet   Comprehensive metabolic panel     Other   Hyperlipidemia   Relevant Orders   Lipid panel   Other Visit Diagnoses     Need for shingles vaccine    -  Primary   Relevant Orders   Varicella-zoster vaccine IM (Shingrix) (Completed)   Flu vaccine need       Relevant Orders   Flu Vaccine QUAD High Dose(Fluad) (Completed)   Screening for endocrine, metabolic and immunity disorder       Relevant Orders   Hemoglobin A1c   TSH   Lipid screening       Relevant Orders   Lipid  panel   Screening PSA (prostate specific antigen)       Relevant Orders   PSA, Medicare ( [facility] only)       No orders of the defined types were placed in this encounter.   Follow-up: Return in about 6 months (around 04/08/2021) for htn, hld.   PLAN No med refills needed at this time. Bp well controlled, continue current regimen Labs collected. Will follow up with the patient as warranted. Patient encouraged to call clinic with any questions, comments, or concerns.  Maximiano Coss, NP

## 2020-10-28 ENCOUNTER — Other Ambulatory Visit: Payer: Self-pay

## 2020-10-28 ENCOUNTER — Other Ambulatory Visit (INDEPENDENT_AMBULATORY_CARE_PROVIDER_SITE_OTHER): Payer: PPO

## 2020-10-28 ENCOUNTER — Other Ambulatory Visit: Payer: Self-pay | Admitting: Family

## 2020-10-28 DIAGNOSIS — Z13220 Encounter for screening for lipoid disorders: Secondary | ICD-10-CM

## 2020-10-28 DIAGNOSIS — Z1329 Encounter for screening for other suspected endocrine disorder: Secondary | ICD-10-CM

## 2020-10-28 DIAGNOSIS — Z130 Encounter for screening for diseases of the blood and blood-forming organs and certain disorders involving the immune mechanism: Secondary | ICD-10-CM

## 2020-10-28 DIAGNOSIS — E7800 Pure hypercholesterolemia, unspecified: Secondary | ICD-10-CM

## 2020-10-28 DIAGNOSIS — Z13228 Encounter for screening for other metabolic disorders: Secondary | ICD-10-CM

## 2020-10-28 DIAGNOSIS — Z125 Encounter for screening for malignant neoplasm of prostate: Secondary | ICD-10-CM | POA: Diagnosis not present

## 2020-10-28 DIAGNOSIS — I10 Essential (primary) hypertension: Secondary | ICD-10-CM | POA: Diagnosis not present

## 2020-10-28 LAB — COMPREHENSIVE METABOLIC PANEL
ALT: 19 U/L (ref 0–53)
AST: 17 U/L (ref 0–37)
Albumin: 4.3 g/dL (ref 3.5–5.2)
Alkaline Phosphatase: 68 U/L (ref 39–117)
BUN: 17 mg/dL (ref 6–23)
CO2: 27 mEq/L (ref 19–32)
Calcium: 9.3 mg/dL (ref 8.4–10.5)
Chloride: 101 mEq/L (ref 96–112)
Creatinine, Ser: 0.95 mg/dL (ref 0.40–1.50)
GFR: 83.11 mL/min (ref >60.00)
Glucose, Bld: 97 mg/dL (ref 70–99)
Potassium: 3.9 mEq/L (ref 3.5–5.1)
Sodium: 139 mEq/L (ref 135–145)
Total Bilirubin: 0.7 mg/dL (ref 0.2–1.2)
Total Protein: 6.7 g/dL (ref 6.0–8.3)

## 2020-10-28 LAB — HEMOGLOBIN A1C: Hgb A1c MFr Bld: 5.8 % (ref 4.6–6.5)

## 2020-10-28 LAB — CBC WITH DIFFERENTIAL/PLATELET
Basophils Absolute: 0.1 10*3/uL (ref 0.0–0.1)
Basophils Relative: 0.9 % (ref 0.0–3.0)
Eosinophils Absolute: 1.2 10*3/uL — ABNORMAL HIGH (ref 0.0–0.7)
Eosinophils Relative: 15.2 % — ABNORMAL HIGH (ref 0.0–5.0)
HCT: 42.4 % (ref 39.0–52.0)
Hemoglobin: 14.3 g/dL (ref 13.0–17.0)
Lymphocytes Relative: 34.9 % (ref 12.0–46.0)
Lymphs Abs: 2.7 10*3/uL (ref 0.7–4.0)
MCHC: 33.8 g/dL (ref 30.0–36.0)
MCV: 94.7 fl (ref 78.0–100.0)
Monocytes Absolute: 0.4 10*3/uL (ref 0.1–1.0)
Monocytes Relative: 5.8 % (ref 3.0–12.0)
Neutro Abs: 3.3 10*3/uL (ref 1.4–7.7)
Neutrophils Relative %: 43.2 % (ref 43.0–77.0)
Platelets: 207 10*3/uL (ref 150.0–400.0)
RBC: 4.48 Mil/uL (ref 4.22–5.81)
RDW: 13.6 % (ref 11.5–15.5)
WBC: 7.7 10*3/uL (ref 4.0–10.5)

## 2020-10-28 LAB — LIPID PANEL
Cholesterol: 201 mg/dL — ABNORMAL HIGH (ref 0–200)
HDL: 44.8 mg/dL (ref >39.00)
LDL Cholesterol: 119 mg/dL — ABNORMAL HIGH (ref 0–99)
NonHDL: 156.06
Total CHOL/HDL Ratio: 4
Triglycerides: 186 mg/dL — ABNORMAL HIGH (ref 0.0–149.0)
VLDL: 37.2 mg/dL (ref 0.0–40.0)

## 2020-10-28 LAB — PSA, MEDICARE: PSA: 0.09 ng/ml — ABNORMAL LOW (ref 0.10–4.00)

## 2020-10-28 LAB — TSH: TSH: 1.94 u[IU]/mL (ref 0.35–5.50)

## 2020-10-30 ENCOUNTER — Encounter: Payer: Self-pay | Admitting: Registered Nurse

## 2020-11-01 ENCOUNTER — Encounter: Payer: Self-pay | Admitting: Registered Nurse

## 2020-11-05 ENCOUNTER — Other Ambulatory Visit: Payer: Self-pay | Admitting: Family

## 2020-11-09 ENCOUNTER — Encounter: Payer: Self-pay | Admitting: Registered Nurse

## 2020-11-09 MED ORDER — BUPROPION HCL ER (XL) 300 MG PO TB24: 300.0000 mg | ORAL_TABLET | Freq: Every day | ORAL | 3 refills | Status: DC

## 2020-11-09 MED ORDER — OMEPRAZOLE 20 MG PO CPDR: DELAYED_RELEASE_CAPSULE | ORAL | 3 refills | Status: DC

## 2020-11-09 NOTE — Telephone Encounter (Signed)
Rx not prescribed by you. Please advise. Rx pended.   Pt sent mychart message on 10/02 stating the following:  Basophils - Why do these always come in at the top of the range at .1 the last several years? Are they associated with the Eosinophils?   I have always had the CBC lab test done but this is the first time that I had the CBC/DIFF//PLT - why did we start the new test? Is it more thorough?  Please advise on questions above.

## 2020-11-10 DIAGNOSIS — Z23 Encounter for immunization: Secondary | ICD-10-CM | POA: Diagnosis not present

## 2020-11-10 DIAGNOSIS — L820 Inflamed seborrheic keratosis: Secondary | ICD-10-CM | POA: Diagnosis not present

## 2020-12-02 ENCOUNTER — Ambulatory Visit: Payer: PPO

## 2020-12-09 ENCOUNTER — Other Ambulatory Visit: Payer: Self-pay | Admitting: Family

## 2020-12-09 DIAGNOSIS — F32A Depression, unspecified: Secondary | ICD-10-CM

## 2020-12-11 ENCOUNTER — Other Ambulatory Visit: Payer: Self-pay | Admitting: Family Medicine

## 2020-12-11 DIAGNOSIS — F419 Anxiety disorder, unspecified: Secondary | ICD-10-CM

## 2020-12-11 MED ORDER — SERTRALINE HCL 50 MG PO TABS: 50.0000 mg | ORAL_TABLET | Freq: Every day | ORAL | 0 refills | Status: DC

## 2021-02-20 ENCOUNTER — Other Ambulatory Visit: Payer: Self-pay | Admitting: Registered Nurse

## 2021-03-09 ENCOUNTER — Other Ambulatory Visit: Payer: Self-pay | Admitting: Registered Nurse

## 2021-03-09 DIAGNOSIS — Z23 Encounter for immunization: Secondary | ICD-10-CM | POA: Diagnosis not present

## 2021-03-09 DIAGNOSIS — F419 Anxiety disorder, unspecified: Secondary | ICD-10-CM

## 2021-03-09 DIAGNOSIS — L309 Dermatitis, unspecified: Secondary | ICD-10-CM | POA: Diagnosis not present

## 2021-03-09 DIAGNOSIS — F32A Depression, unspecified: Secondary | ICD-10-CM

## 2021-03-31 ENCOUNTER — Encounter: Payer: Self-pay | Admitting: Registered Nurse

## 2021-04-01 NOTE — Telephone Encounter (Signed)
I have my 6 month follow up with you next week. ?I want to be sure that we schedule lab test for the two items that were high last time. I believe one may have been high in lab done in 2021 also. ?I have been treated by my Dermatologist for itchy skin over the last several months. Since both of my parents had kidney issues I just want to be sure that we are proactive in that area. Thanks! Antonio Cooper ?

## 2021-04-05 ENCOUNTER — Encounter: Payer: Self-pay | Admitting: Registered Nurse

## 2021-04-06 DIAGNOSIS — L309 Dermatitis, unspecified: Secondary | ICD-10-CM | POA: Diagnosis not present

## 2021-04-06 DIAGNOSIS — Z23 Encounter for immunization: Secondary | ICD-10-CM | POA: Diagnosis not present

## 2021-04-08 ENCOUNTER — Telehealth: Payer: PPO | Admitting: Registered Nurse

## 2021-04-12 DIAGNOSIS — Z9889 Other specified postprocedural states: Secondary | ICD-10-CM | POA: Diagnosis not present

## 2021-04-12 DIAGNOSIS — H25813 Combined forms of age-related cataract, bilateral: Secondary | ICD-10-CM | POA: Diagnosis not present

## 2021-04-14 ENCOUNTER — Ambulatory Visit (INDEPENDENT_AMBULATORY_CARE_PROVIDER_SITE_OTHER): Payer: PPO | Admitting: Registered Nurse

## 2021-04-14 ENCOUNTER — Other Ambulatory Visit: Payer: Self-pay

## 2021-04-14 ENCOUNTER — Encounter: Payer: Self-pay | Admitting: Registered Nurse

## 2021-04-14 VITALS — BP 140/72 | HR 68 | Temp 98.1°F | Resp 17 | Ht 71.0 in | Wt 241.8 lb

## 2021-04-14 DIAGNOSIS — N529 Male erectile dysfunction, unspecified: Secondary | ICD-10-CM

## 2021-04-14 DIAGNOSIS — N401 Enlarged prostate with lower urinary tract symptoms: Secondary | ICD-10-CM

## 2021-04-14 DIAGNOSIS — E7800 Pure hypercholesterolemia, unspecified: Secondary | ICD-10-CM | POA: Diagnosis not present

## 2021-04-14 DIAGNOSIS — K2100 Gastro-esophageal reflux disease with esophagitis, without bleeding: Secondary | ICD-10-CM | POA: Diagnosis not present

## 2021-04-14 DIAGNOSIS — F32A Depression, unspecified: Secondary | ICD-10-CM | POA: Diagnosis not present

## 2021-04-14 DIAGNOSIS — L309 Dermatitis, unspecified: Secondary | ICD-10-CM | POA: Diagnosis not present

## 2021-04-14 DIAGNOSIS — I10 Essential (primary) hypertension: Secondary | ICD-10-CM

## 2021-04-14 DIAGNOSIS — F419 Anxiety disorder, unspecified: Secondary | ICD-10-CM

## 2021-04-14 DIAGNOSIS — Z23 Encounter for immunization: Secondary | ICD-10-CM

## 2021-04-14 LAB — LIPID PANEL
Cholesterol: 213 mg/dL — ABNORMAL HIGH (ref 0–200)
HDL: 47.3 mg/dL (ref >39.00)
LDL Cholesterol: 128 mg/dL — ABNORMAL HIGH (ref 0–99)
NonHDL: 166.06
Total CHOL/HDL Ratio: 5
Triglycerides: 188 mg/dL — ABNORMAL HIGH (ref 0.0–149.0)
VLDL: 37.6 mg/dL (ref 0.0–40.0)

## 2021-04-14 LAB — COMPREHENSIVE METABOLIC PANEL
ALT: 24 U/L (ref 0–53)
AST: 18 U/L (ref 0–37)
Albumin: 4.4 g/dL (ref 3.5–5.2)
Alkaline Phosphatase: 71 U/L (ref 39–117)
BUN: 16 mg/dL (ref 6–23)
CO2: 30 mEq/L (ref 19–32)
Calcium: 9.5 mg/dL (ref 8.4–10.5)
Chloride: 101 mEq/L (ref 96–112)
Creatinine, Ser: 1.05 mg/dL (ref 0.40–1.50)
GFR: 73.47 mL/min (ref >60.00)
Glucose, Bld: 116 mg/dL — ABNORMAL HIGH (ref 70–99)
Potassium: 4 mEq/L (ref 3.5–5.1)
Sodium: 138 mEq/L (ref 135–145)
Total Bilirubin: 0.6 mg/dL (ref 0.2–1.2)
Total Protein: 6.6 g/dL (ref 6.0–8.3)

## 2021-04-14 LAB — PSA, MEDICARE: PSA: 0.07 ng/ml — ABNORMAL LOW (ref 0.10–4.00)

## 2021-04-14 LAB — CBC WITH DIFFERENTIAL/PLATELET
Basophils Absolute: 0.1 10*3/uL (ref 0.0–0.1)
Basophils Relative: 0.9 % (ref 0.0–3.0)
Eosinophils Absolute: 0.4 10*3/uL (ref 0.0–0.7)
Eosinophils Relative: 4.6 % (ref 0.0–5.0)
HCT: 43.6 % (ref 39.0–52.0)
Hemoglobin: 14.7 g/dL (ref 13.0–17.0)
Lymphocytes Relative: 32.7 % (ref 12.0–46.0)
Lymphs Abs: 2.6 10*3/uL (ref 0.7–4.0)
MCHC: 33.9 g/dL (ref 30.0–36.0)
MCV: 95.3 fl (ref 78.0–100.0)
Monocytes Absolute: 0.5 10*3/uL (ref 0.1–1.0)
Monocytes Relative: 6.5 % (ref 3.0–12.0)
Neutro Abs: 4.4 10*3/uL (ref 1.4–7.7)
Neutrophils Relative %: 55.3 % (ref 43.0–77.0)
Platelets: 215 10*3/uL (ref 150.0–400.0)
RBC: 4.57 Mil/uL (ref 4.22–5.81)
RDW: 13.9 % (ref 11.5–15.5)
WBC: 8 10*3/uL (ref 4.0–10.5)

## 2021-04-14 MED ORDER — BUPROPION HCL ER (XL) 300 MG PO TB24: 300.0000 mg | ORAL_TABLET | Freq: Every day | ORAL | 3 refills | Status: DC

## 2021-04-14 MED ORDER — ZOSTER VAC RECOMB ADJUVANTED 50 MCG/0.5ML IM SUSR: 0.5000 mL | Freq: Once | INTRAMUSCULAR | 0 refills | Status: AC

## 2021-04-14 MED ORDER — SERTRALINE HCL 50 MG PO TABS: 50.0000 mg | ORAL_TABLET | Freq: Every day | ORAL | 3 refills | Status: DC

## 2021-04-14 MED ORDER — METOPROLOL TARTRATE 50 MG PO TABS: 50.0000 mg | ORAL_TABLET | Freq: Two times a day (BID) | ORAL | 1 refills | Status: DC

## 2021-04-14 MED ORDER — CETIRIZINE HCL 10 MG PO TABS
10.0000 mg | ORAL_TABLET | Freq: Every day | ORAL | 3 refills | Status: DC
Start: 2021-04-14 — End: 2023-01-02

## 2021-04-14 MED ORDER — OMEPRAZOLE 20 MG PO CPDR: DELAYED_RELEASE_CAPSULE | ORAL | 3 refills | Status: DC

## 2021-04-14 MED ORDER — SILDENAFIL CITRATE 20 MG PO TABS: ORAL_TABLET | ORAL | 5 refills | Status: DC

## 2021-04-14 MED ORDER — LIVALO 4 MG PO TABS: 1.0000 | ORAL_TABLET | Freq: Every day | ORAL | 1 refills | Status: DC

## 2021-04-14 NOTE — Progress Notes (Signed)
? ?Established Patient Office Visit ? ?Subjective:  ?Patient ID: Antonio Cooper, male    DOB: [birth year]  Age: 66 y.o. MRN: [id number] ? ?CC:  ?Chief Complaint  ?Patient presents with  ? Follow-up  ?  Patient states he is here for 6 month follow up and re check lab work  ? ? ?HPI ?Antonio Cooper presents for 6 mo follow up  ? ?Hypertension: ?Patient Currently taking: metoprolol '50mg'$  po qd, triamterene-hydrochlorothiazide 37.5-'25mg'$  po qd ?Good effect. No AEs. ?Denies CV symptoms including: chest pain, shob, doe, headache, visual changes, fatigue, claudication, and dependent edema.  ? ?Previous readings and labs: ?BP Readings from Last 3 Encounters:  ?04/14/21 140/72  ?10/09/20 128/80  ?01/06/20 110/80  ? ?Lab Results  ?Component Value Date  ? CREATININE 1.05 04/14/2021  ? ? ? ?Hld ?Lab Results  ?Component Value Date  ? CHOL 213 (H) 04/14/2021  ? HDL 47.30 04/14/2021  ? LDLCALC 128 (H) 04/14/2021  ? LDLDIRECT 112.0 08/27/2019  ? TRIG 188.0 (H) 04/14/2021  ? CHOLHDL 5 04/14/2021  ? ?Takes pitavastatin '4mg'$  po qd. Good effect. No AE. Watches diet.  ? ?Anxiety and depression ?Sertraline '50mg'$  po qd, wellbutrin '300mg'$  XL po qd ?Good effect, no AE. Hopes to continue.  ? ?ED ?Sildenafil '20mg'$  po qd prn ?Good effect, no AE. Hopes to continue.  ? ?Hypereosinophilia ?Noted on last labs.  ?No symptoms of concern ?Wants to recheck today.  ? ?Past Medical History:  ?Diagnosis Date  ? Allergy   ? Bulging lumbar disc   ? Cataract   ? Colon polyps   ? Depression   ? GERD (gastroesophageal reflux disease)   ? Hyperlipidemia   ? Hypertension   ? Spinal stenosis   ? ? ?Past Surgical History:  ?Procedure Laterality Date  ? broken nose   1974  ? COLONOSCOPY    ? I & D EXTREMITY Left 12/18/2012  ? Procedure: I&D Left Middle Finger With Repair Recons.Prn;  Surgeon: Roseanne Kaufman, MD;  Location: [place];  Service: Orthopedics;  Laterality: Left;  ? I & D EXTREMITY Left 12/20/2012  ? Procedure: LEFT HAND Incision and drainage  TO INCLUDE MIDDLE  FINGER FLEXOR TENDON;  Surgeon: Roseanne Kaufman, MD;  Location: [place];  Service: Orthopedics;  Laterality: Left;  ? I & D EXTREMITY Left 12/22/2012  ? Procedure: IRRIGATION AND DEBRIDEMENT HAND;  Surgeon: Roseanne Kaufman, MD;  Location: [place];  Service: Orthopedics;  Laterality: Left;  ? REFRACTIVE SURGERY    ? TYMPANIC MEMBRANE REPAIR    ? WISDOM TOOTH EXTRACTION    ? ? ?Family History  ?Problem Relation Age of Onset  ? Hypertension Father 11  ?     Deceased  ? Neuropathy Father   ? Kidney cancer Mother 15  ?     Deceased  ? Kidney disease Mother   ? Kidney cancer Maternal Grandmother   ? Cancer Other   ?     Aunts & Uncles  ? Colon cancer Other   ? Diabetes Brother   ? Breast cancer Sister   ? Healthy Brother   ?     x1  ? Healthy Daughter   ?     x2  ? Esophageal cancer Neg Hx   ? Rectal cancer Neg Hx   ? Stomach cancer Neg Hx   ? ? ?Social History  ? ?Socioeconomic History  ? Marital status: Single  ?  Spouse name: Not on file  ? Number of children: 2  ?  Years of education: Not on file  ? Highest education level: Not on file  ?Occupational History  ? Occupation: CFO  ?Tobacco Use  ? Smoking status: Former  ? Smokeless tobacco: Never  ?Vaping Use  ? Vaping Use: Never used  ?Substance and Sexual Activity  ? Alcohol use: Yes  ?  Alcohol/week: 0.0 standard drinks  ?  Comment: social  ? Drug use: No  ? Sexual activity: Yes  ?Other Topics Concern  ? Not on file  ?Social History Narrative  ? Not on file  ? ?Social Determinants of Health  ? ?Financial Resource Strain: Not on file  ?Food Insecurity: Not on file  ?Transportation Needs: Not on file  ?Physical Activity: Not on file  ?Stress: Not on file  ?Social Connections: Not on file  ?Intimate Partner Violence: Not on file  ? ? ?Outpatient Medications Prior to Visit  ?Medication Sig Dispense Refill  ? carboxymethylcellulose 1 % ophthalmic solution 1 drop 2 times daily.    ? Emollient (CERAVE MOISTURIZING) CREA See admin instructions.    ? finasteride (PROSCAR) 5 MG tablet  Take 5 mg by mouth daily.  3  ? Multiple Vitamins-Minerals (MULTIVITAMIN WITH MINERALS) tablet Take by mouth.    ? triamcinolone cream (KENALOG) 0.1 % SMARTSIG:1 Application Topical 2-3 Times Daily    ? triamterene-hydrochlorothiazide (MAXZIDE-25) 37.5-25 MG tablet TAKE 1 TABLET BY MOUTH EVERY DAY 90 tablet 1  ? buPROPion (WELLBUTRIN XL) 300 MG 24 hr tablet Take 1 tablet (300 mg total) by mouth daily. 90 tablet 3  ? LIVALO 4 MG TABS TAKE 1 TABLET BY MOUTH EVERY DAY 90 tablet 1  ? metoprolol tartrate (LOPRESSOR) 50 MG tablet TAKE 1 TABLET BY MOUTH TWICE A DAY 180 tablet 0  ? omeprazole (PRILOSEC) 20 MG capsule TAKE 1 CAPSULE BY MOUTH EVERY DAY 90 capsule 3  ? sertraline (ZOLOFT) 50 MG tablet TAKE 1 TABLET BY MOUTH EVERY DAY 90 tablet 3  ? sildenafil (REVATIO) 20 MG tablet TAKE 1 TABLET (20 MG TOTAL) 3 (THREE) TIMES DAILY BY MOUTH. 10 tablet 5  ? ?No facility-administered medications prior to visit.  ? ? ?No Known Allergies ? ?ROS ?Review of Systems ? ?  ?Objective:  ?  ?Physical Exam ? ?BP 140/72   Pulse 68   Temp 98.1 ?F (36.7 ?C) (Temporal)   Resp 17   Ht '5\' 11"'$  (1.803 m)   Wt 241 lb 12.8 oz (109.7 kg)   SpO2 95%   BMI 33.72 kg/m?  ?Wt Readings from Last 3 Encounters:  ?04/14/21 241 lb 12.8 oz (109.7 kg)  ?10/09/20 245 lb 9.6 oz (111.4 kg)  ?01/06/20 233 lb (105.7 kg)  ? ? ? ?Health Maintenance Due  ?Topic Date Due  ? COVID-19 Vaccine (4 - Booster for Pfizer series) 03/16/2020  ? Zoster Vaccines- Shingrix (2 of 2) 12/04/2020  ? ? ?There are no preventive care reminders to display for this patient. ? ?Lab Results  ?Component Value Date  ? TSH 1.94 10/28/2020  ? ?Lab Results  ?Component Value Date  ? WBC 8.0 04/14/2021  ? HGB 14.7 04/14/2021  ? HCT 43.6 04/14/2021  ? MCV 95.3 04/14/2021  ? PLT 215.0 04/14/2021  ? ?Lab Results  ?Component Value Date  ? NA 138 04/14/2021  ? K 4.0 04/14/2021  ? CO2 30 04/14/2021  ? GLUCOSE 116 (H) 04/14/2021  ? BUN 16 04/14/2021  ? CREATININE 1.05 04/14/2021  ? BILITOT 0.6  04/14/2021  ? ALKPHOS 71 04/14/2021  ? AST 18  04/14/2021  ? ALT 24 04/14/2021  ? PROT 6.6 04/14/2021  ? ALBUMIN 4.4 04/14/2021  ? CALCIUM 9.5 04/14/2021  ? GFR 73.47 04/14/2021  ? ?Lab Results  ?Component Value Date  ? CHOL 213 (H) 04/14/2021  ? ?Lab Results  ?Component Value Date  ? HDL 47.30 04/14/2021  ? ?Lab Results  ?Component Value Date  ? LDLCALC 128 (H) 04/14/2021  ? ?Lab Results  ?Component Value Date  ? TRIG 188.0 (H) 04/14/2021  ? ?Lab Results  ?Component Value Date  ? CHOLHDL 5 04/14/2021  ? ?Lab Results  ?Component Value Date  ? HGBA1C 5.8 10/28/2020  ? ? ?  ?Assessment & Plan:  ? ?Problem List Items Addressed This Visit   ? ?  ? Cardiovascular and Mediastinum  ? Essential hypertension  ? Relevant Medications  ? metoprolol tartrate (LOPRESSOR) 50 MG tablet  ? Pitavastatin Calcium (LIVALO) 4 MG TABS  ? sildenafil (REVATIO) 20 MG tablet  ? Other Relevant Orders  ? Lipid panel (Completed)  ? Comprehensive metabolic panel (Completed)  ? CBC with Differential/Platelet (Completed)  ?  ? Digestive  ? GERD  ? Relevant Medications  ? omeprazole (PRILOSEC) 20 MG capsule  ?  ? Genitourinary  ? BPH (benign prostatic hyperplasia)  ? Relevant Orders  ? PSA, Medicare ( [place] only) (Completed)  ?  ? Other  ? Hyperlipidemia - Primary  ? Relevant Medications  ? metoprolol tartrate (LOPRESSOR) 50 MG tablet  ? Pitavastatin Calcium (LIVALO) 4 MG TABS  ? sildenafil (REVATIO) 20 MG tablet  ? Other Relevant Orders  ? Lipid panel (Completed)  ? Comprehensive metabolic panel (Completed)  ? CBC with Differential/Platelet (Completed)  ? Anxiety and depression  ? Relevant Medications  ? sertraline (ZOLOFT) 50 MG tablet  ? buPROPion (WELLBUTRIN XL) 300 MG 24 hr tablet  ? ?Other Visit Diagnoses   ? ? Eczema, unspecified type      ? Relevant Medications  ? cetirizine (ZYRTEC) 10 MG tablet  ? Need for shingles vaccine      ? Erectile dysfunction, unspecified erectile dysfunction type      ? Relevant Medications  ? sildenafil  (REVATIO) 20 MG tablet  ? ?  ? ? ?Meds ordered this encounter  ?Medications  ? cetirizine (ZYRTEC) 10 MG tablet  ?  Sig: Take 1 tablet (10 mg total) by mouth daily.  ?  Dispense:  90 tablet  ?  Refill:  3  ?  Order

## 2021-04-14 NOTE — Patient Instructions (Addendum)
Mr. Denz -  ? ?Always a pleasure ? ?Let's check labs ? ?Shingles shot sent to pharmacy  ? ?Meds refilled x 6 mo - see you then! ? ?Thanks, ? ?Rich  ? ? ? ?If you have lab work done today you will be contacted with your lab results within the next 2 weeks.  If you have not heard from [place] then please contact us. The fastest way to get your results is to register for My Chart. ? ? ?IF you received an x-ray today, you will receive an invoice from [facility]. Please contact [facility] at [phone] with questions or concerns regarding your invoice.  ? ?IF you received labwork today, you will receive an invoice from [facility]. Please contact [facility] at [phone] with questions or concerns regarding your invoice.  ? ?Our billing staff will not be able to assist you with questions regarding bills from these companies. ? ?You will be contacted with the lab results as soon as they are available. The fastest way to get your results is to activate your My Chart account. Instructions are located on the last page of this paperwork. If you have not heard from [place] regarding the results in 2 weeks, please contact this office. ?  ? ? ?

## 2021-04-28 NOTE — Progress Notes (Signed)
No show for appt ? ?Kathrin Ruddy, NP ?

## 2021-07-14 IMAGING — DX RIGHT KNEE - COMPLETE 4+ VIEW
4 series · 4 of 4 positions shown · non-contrast
Comparison: None.

CLINICAL DATA: Right knee injury 1 month ago.  Pain

EXAM:
RIGHT KNEE - COMPLETE 4+ VIEW

[knee ap]
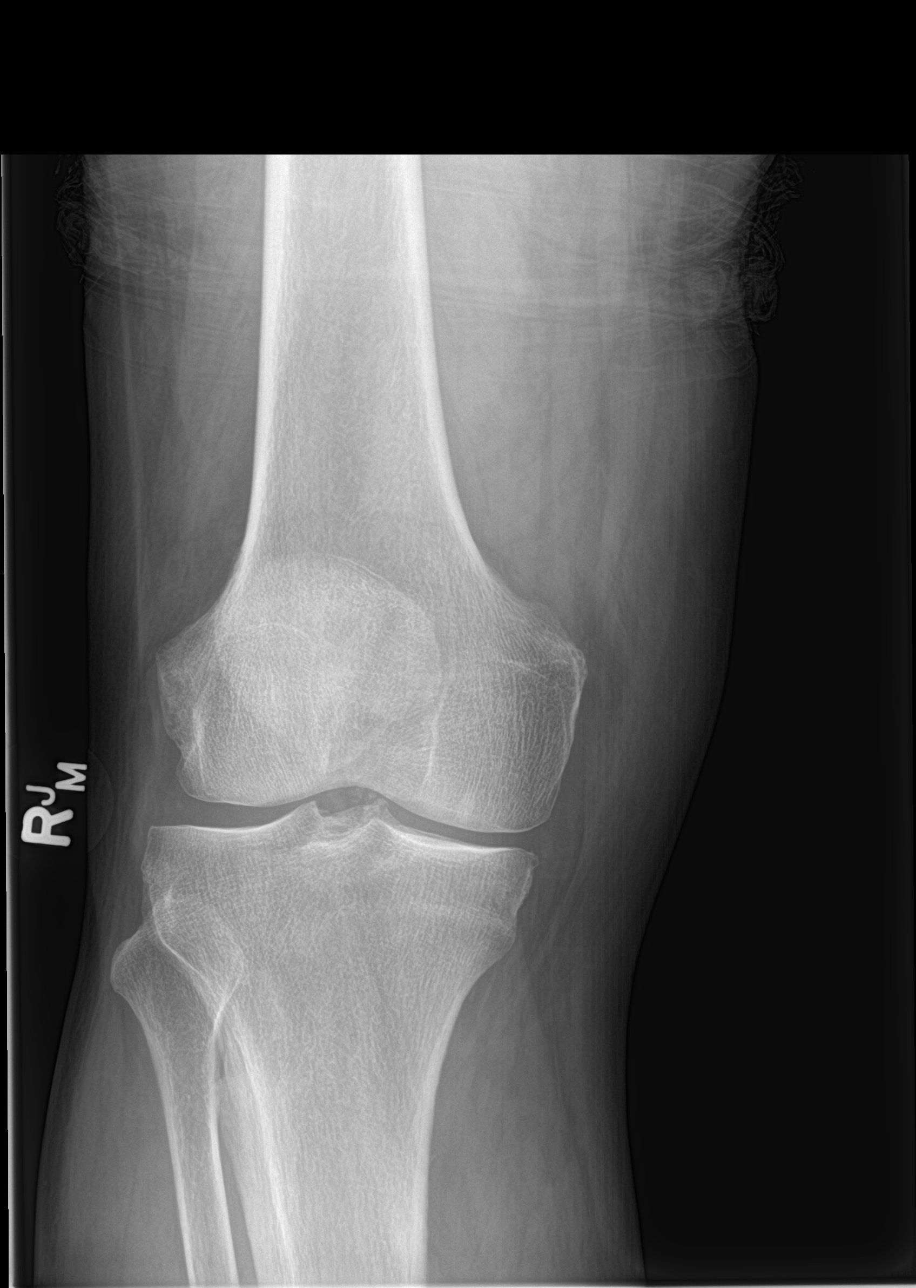

[tunnel]
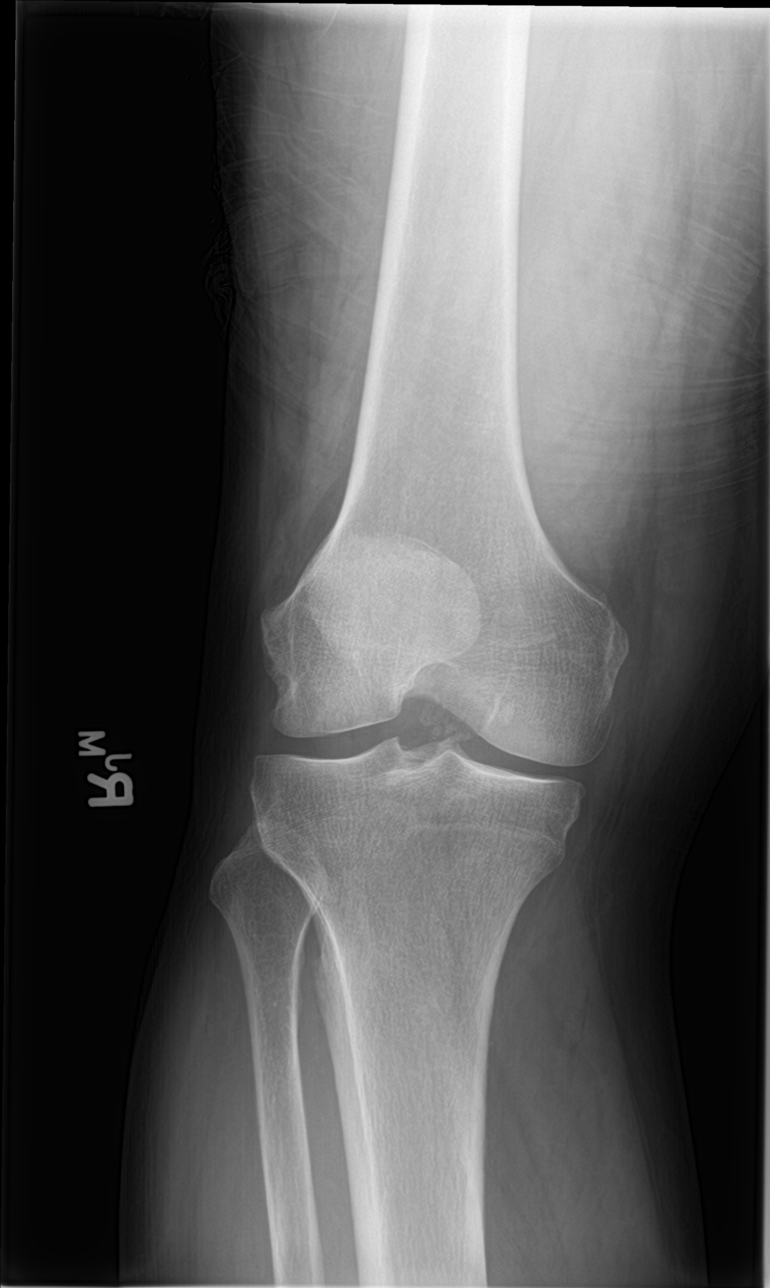

[knee lat]
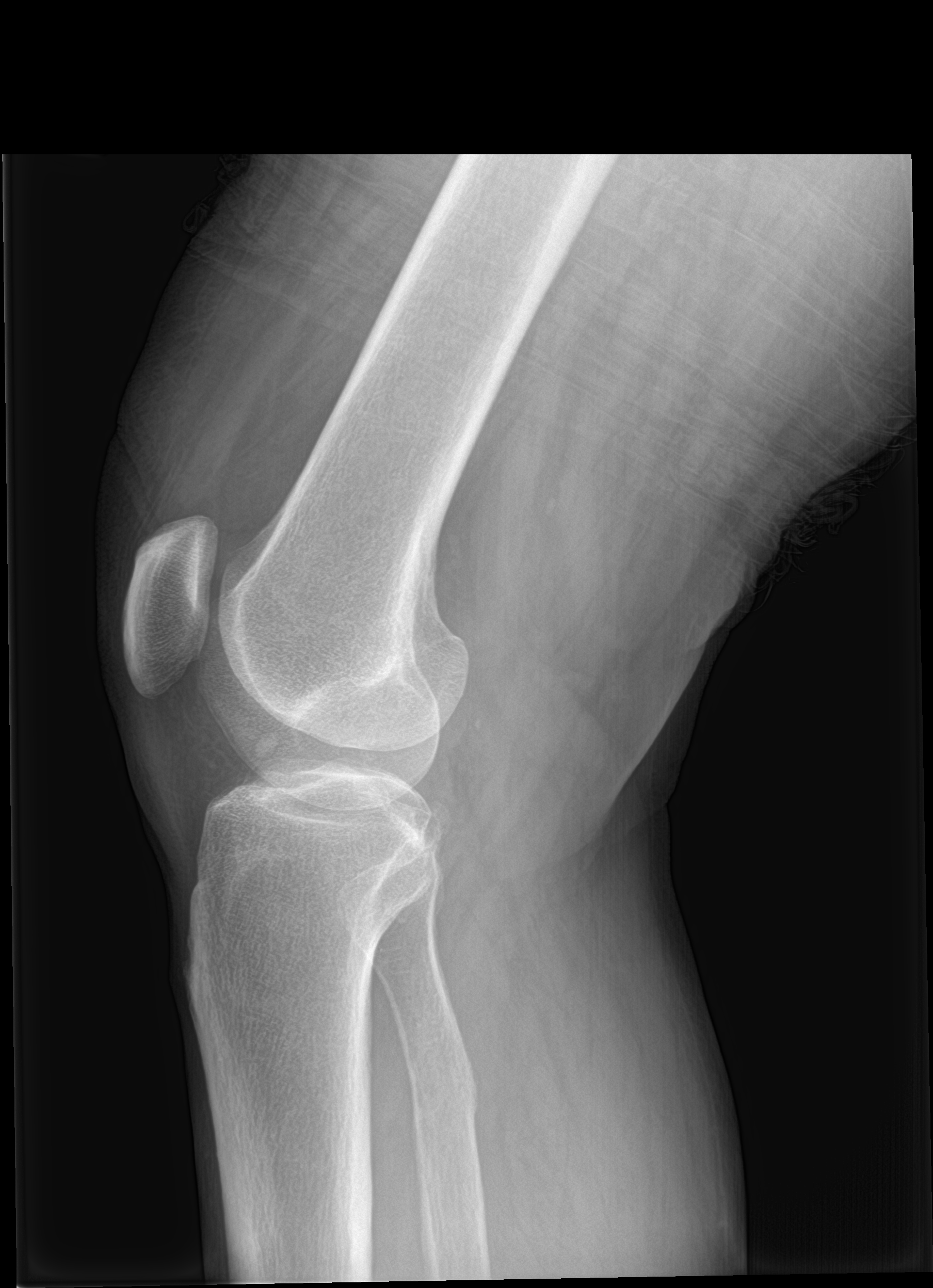

[knee sunrise]
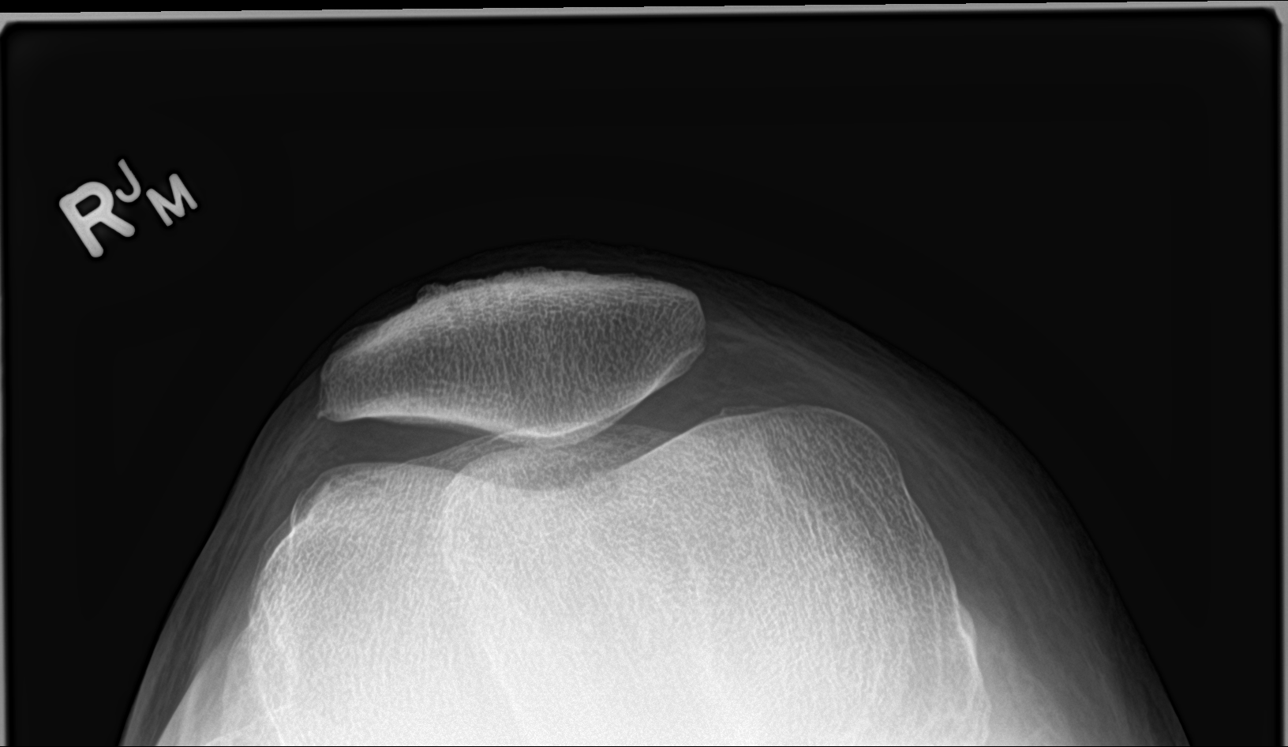

[4 of 4 positions shown; findings below may reference images not displayed]

FINDINGS: Spurring noted along the tibial spines. Joint spaces are maintained.
No acute bony abnormality. Specifically, no fracture, subluxation,
or dislocation. No joint effusion.
IMPRESSION: No acute bony abnormality.  Spurring along the tibial spines.

## 2021-07-27 DIAGNOSIS — R350 Frequency of micturition: Secondary | ICD-10-CM | POA: Diagnosis not present

## 2021-07-27 DIAGNOSIS — N5201 Erectile dysfunction due to arterial insufficiency: Secondary | ICD-10-CM | POA: Diagnosis not present

## 2021-07-27 DIAGNOSIS — E349 Endocrine disorder, unspecified: Secondary | ICD-10-CM | POA: Diagnosis not present

## 2021-07-27 DIAGNOSIS — N401 Enlarged prostate with lower urinary tract symptoms: Secondary | ICD-10-CM | POA: Diagnosis not present

## 2021-07-27 DIAGNOSIS — Z125 Encounter for screening for malignant neoplasm of prostate: Secondary | ICD-10-CM | POA: Diagnosis not present

## 2021-08-02 ENCOUNTER — Encounter: Payer: Self-pay | Admitting: Registered Nurse

## 2021-08-05 DIAGNOSIS — E349 Endocrine disorder, unspecified: Secondary | ICD-10-CM | POA: Diagnosis not present

## 2021-09-10 ENCOUNTER — Ambulatory Visit (INDEPENDENT_AMBULATORY_CARE_PROVIDER_SITE_OTHER): Payer: PPO | Admitting: Family Medicine

## 2021-09-10 ENCOUNTER — Encounter: Payer: Self-pay | Admitting: Family Medicine

## 2021-09-10 VITALS — BP 138/88 | HR 83 | Temp 98.0°F | Resp 16 | Ht 71.0 in | Wt 234.4 lb

## 2021-09-10 DIAGNOSIS — M5416 Radiculopathy, lumbar region: Secondary | ICD-10-CM

## 2021-09-10 DIAGNOSIS — E349 Endocrine disorder, unspecified: Secondary | ICD-10-CM | POA: Diagnosis not present

## 2021-09-10 MED ORDER — PREDNISONE 10 MG PO TABS: ORAL_TABLET | ORAL | 0 refills | Status: DC

## 2021-09-10 MED ORDER — METHOCARBAMOL 500 MG PO TABS: 500.0000 mg | ORAL_TABLET | Freq: Three times a day (TID) | ORAL | 0 refills | Status: AC | PRN

## 2021-09-10 NOTE — Progress Notes (Signed)
   Subjective:    Patient ID: Antonio Cooper, male    DOB: 1955-02-26, 66 y.o.   MRN: [id number]  HPI Back and hip pain- pt reports he hurt his back '3 weeks ago moving things'.  'L low back and upper hip area'.  Pain is radiating down leg and he had numbness/tingling of L foot.  Has been taking ibuprofen w/ minimal relief.  Pt reports pain is worse in the morning when he tries to get up.  'it takes a good hour of moving around to feel better'.  MRI done in 2016 shows multiple bulging discs, canal narrowing (Dr Ellene Route)  Denies incontinence.     Review of Systems For ROS see HPI     Objective:   Physical Exam Vitals reviewed.  Constitutional:      General: He is not in acute distress.    Appearance: Normal appearance. He is not ill-appearing.  HENT:     Head: Normocephalic and atraumatic.  Musculoskeletal:        General: Tenderness (L lower back) present.  Skin:    General: Skin is warm and dry.  Neurological:     General: No focal deficit present.     Mental Status: He is alert and oriented to person, place, and time.     Motor: No weakness.     Coordination: Coordination normal.     Gait: Gait normal.  Psychiatric:        Mood and Affect: Mood normal.        Behavior: Behavior normal.        Thought Content: Thought content normal.           Assessment & Plan:  L lumbar radiculopathy- new.  Pt reports he has hx of this but it usually resolves after a few days and this has been going on x3 weeks w/o relief.  Dr Ellene Route did MRI back in 2016 that showed multiple bulging discs and narrowed canal.  Will start Prednisone taper and Methocarbamol for current sxs and refer back to Dr Ellene Route for evaluation and tx given the recurrent and worsening nature of his pain.  Reviewed supportive care and red flags that should prompt return.  Pt expressed understanding and is in agreement w/ plan.

## 2021-09-10 NOTE — Patient Instructions (Addendum)
Follow up as needed or as scheduled START the Prednisone today- 3 tabs at the same time x3 days, then 2 tabs at the same time w/ food x3 days, then 1 tab daily.  Take w/ food USE the Methocarbamol as needed for pain and spasm- may cause some drowsiness HEAT! We'll call you to schedule your appt w/ Dr Ellene Route (neurosurgeon) Call with any questions or concerns Hang in there!

## 2021-09-27 ENCOUNTER — Encounter: Payer: Self-pay | Admitting: Family Medicine

## 2021-09-27 ENCOUNTER — Other Ambulatory Visit: Payer: Self-pay | Admitting: Family Medicine

## 2021-09-29 DIAGNOSIS — L821 Other seborrheic keratosis: Secondary | ICD-10-CM | POA: Diagnosis not present

## 2021-09-29 DIAGNOSIS — L578 Other skin changes due to chronic exposure to nonionizing radiation: Secondary | ICD-10-CM | POA: Diagnosis not present

## 2021-09-29 DIAGNOSIS — D1801 Hemangioma of skin and subcutaneous tissue: Secondary | ICD-10-CM | POA: Diagnosis not present

## 2021-09-29 DIAGNOSIS — L814 Other melanin hyperpigmentation: Secondary | ICD-10-CM | POA: Diagnosis not present

## 2021-09-29 DIAGNOSIS — L209 Atopic dermatitis, unspecified: Secondary | ICD-10-CM | POA: Diagnosis not present

## 2021-10-04 ENCOUNTER — Other Ambulatory Visit: Payer: Self-pay | Admitting: Family

## 2021-10-04 MED ORDER — TRAMADOL HCL 50 MG PO TABS: 50.0000 mg | ORAL_TABLET | Freq: Three times a day (TID) | ORAL | 0 refills | Status: AC | PRN

## 2021-10-04 MED ORDER — PREDNISONE 20 MG PO TABS: ORAL_TABLET | ORAL | 0 refills | Status: DC

## 2021-10-14 ENCOUNTER — Ambulatory Visit: Payer: PPO | Admitting: Registered Nurse

## 2021-10-27 NOTE — Telephone Encounter (Signed)
I have LM for Nevin Bloodgood to call me back in regards to this referral

## 2021-11-02 ENCOUNTER — Other Ambulatory Visit: Payer: Self-pay

## 2021-11-02 MED ORDER — TRIAMTERENE-HCTZ 37.5-25 MG PO TABS: 1.0000 | ORAL_TABLET | Freq: Every day | ORAL | 1 refills | Status: DC

## 2021-11-24 DIAGNOSIS — M5416 Radiculopathy, lumbar region: Secondary | ICD-10-CM | POA: Diagnosis not present

## 2021-12-09 ENCOUNTER — Encounter: Payer: Self-pay | Admitting: Family Medicine

## 2021-12-09 DIAGNOSIS — M5416 Radiculopathy, lumbar region: Secondary | ICD-10-CM | POA: Diagnosis not present

## 2021-12-09 DIAGNOSIS — M5136 Other intervertebral disc degeneration, lumbar region: Secondary | ICD-10-CM | POA: Diagnosis not present

## 2021-12-09 DIAGNOSIS — M5126 Other intervertebral disc displacement, lumbar region: Secondary | ICD-10-CM | POA: Diagnosis not present

## 2021-12-16 DIAGNOSIS — M5126 Other intervertebral disc displacement, lumbar region: Secondary | ICD-10-CM | POA: Diagnosis not present

## 2021-12-29 ENCOUNTER — Ambulatory Visit (INDEPENDENT_AMBULATORY_CARE_PROVIDER_SITE_OTHER): Payer: PPO | Admitting: Nurse Practitioner

## 2021-12-29 ENCOUNTER — Encounter: Payer: Self-pay | Admitting: Nurse Practitioner

## 2021-12-29 VITALS — BP 142/78 | HR 75 | Temp 97.6°F | Ht 70.0 in | Wt 238.6 lb

## 2021-12-29 DIAGNOSIS — K2100 Gastro-esophageal reflux disease with esophagitis, without bleeding: Secondary | ICD-10-CM | POA: Diagnosis not present

## 2021-12-29 DIAGNOSIS — Z0000 Encounter for general adult medical examination without abnormal findings: Secondary | ICD-10-CM | POA: Diagnosis not present

## 2021-12-29 DIAGNOSIS — E782 Mixed hyperlipidemia: Secondary | ICD-10-CM

## 2021-12-29 DIAGNOSIS — M545 Low back pain, unspecified: Secondary | ICD-10-CM | POA: Diagnosis not present

## 2021-12-29 DIAGNOSIS — I10 Essential (primary) hypertension: Secondary | ICD-10-CM | POA: Diagnosis not present

## 2021-12-29 DIAGNOSIS — F32A Depression, unspecified: Secondary | ICD-10-CM | POA: Diagnosis not present

## 2021-12-29 DIAGNOSIS — N401 Enlarged prostate with lower urinary tract symptoms: Secondary | ICD-10-CM | POA: Diagnosis not present

## 2021-12-29 DIAGNOSIS — G8929 Other chronic pain: Secondary | ICD-10-CM

## 2021-12-29 DIAGNOSIS — F419 Anxiety disorder, unspecified: Secondary | ICD-10-CM

## 2021-12-29 DIAGNOSIS — R7309 Other abnormal glucose: Secondary | ICD-10-CM

## 2021-12-29 DIAGNOSIS — E669 Obesity, unspecified: Secondary | ICD-10-CM | POA: Diagnosis not present

## 2021-12-29 NOTE — Assessment & Plan Note (Signed)
Chronic, stable.  Continue Livalo 4 mg daily.  Will check CMP and lipid panel today.

## 2021-12-29 NOTE — Assessment & Plan Note (Signed)
Chronic, stable.  He is doing well on Wellbutrin XL 300 mg daily and sertraline 50 mg daily.  Will continue this regimen.  Follow-up in 6 months.

## 2021-12-29 NOTE — Assessment & Plan Note (Signed)
Chronic, stable.  Continue omeprazole 20 mg daily.

## 2021-12-29 NOTE — Assessment & Plan Note (Signed)
Chronic, stable.  Continue finasteride 5 mg daily.  Will check PSA levels today.

## 2021-12-29 NOTE — Assessment & Plan Note (Signed)
Chronic, stable.  He had a flareup of back pain when he was doing some heavy lifting over the summer.  This has resolved and is now stable again.  Follow-up as symptoms worsen or with any concerns.

## 2021-12-29 NOTE — Patient Instructions (Signed)
It was great to see you!  Come back for labs when you have not eaten for 6-8 hours. You can schedule this on your way out.   Let me know if you need refills on your medications  Let's follow-up in 6 months, sooner if you have concerns.  If a referral was placed today, you will be contacted for an appointment. Please note that routine referrals can sometimes take up to 3-4 weeks to process. Please call our office if you haven't heard anything after this time frame.  Take care,  Vance Peper, NP

## 2021-12-29 NOTE — Assessment & Plan Note (Signed)
BMI 34. Discussed exercise, nutrition.

## 2021-12-29 NOTE — Assessment & Plan Note (Signed)
Chronic, stable.  BP today slightly elevated at 142/78.  Encouraged him to start checking his blood pressure at home.  Will continue metoprolol 50 mg twice a day and triamterene-HCTZ 37.5-25 mg daily.  Check CMP, CBC.  If blood pressure still elevated next visit, will consider increasing medication.  Follow-up in 6 months.

## 2021-12-29 NOTE — Progress Notes (Signed)
New Patient Visit  BP (!) 142/78 (BP Location: Right Arm, Cuff Size: Large)   Pulse 75   Temp 97.6 F (36.4 C) (Temporal)   Ht '5\' 10"'$  (1.778 m)   Wt 238 lb 9.6 oz (108.2 kg)   SpO2 97%   BMI 34.24 kg/m    Subjective:    Patient ID: Antonio Cooper, male    DOB: [birth year], 66 y.o.   MRN: [id number]  CC: Chief Complaint  Patient presents with   Transitions Of Care    TOC No concerns    HPI: Antonio Cooper is a 66 y.o. male presents to transfer care to a new provider.  Introduced to Designer, jewellery role and practice setting.  All questions answered.  Discussed provider/patient relationship and expectations.  Chip has a history of high blood pressure and is currently being metoprolol 50 mg twice a day, triamterene-hydrochlorothiazide 37.5-25 mg daily.  He does not check his blood pressure at home.  He denies chest pain, shortness of breath, headaches.  He has a history of acid reflux and is taking omeprazole 20 mg daily.  He states that this is well-controlled.    He has a history of anxiety depression and is currently taking Wellbutrin XL 300 mg daily and sertraline 50 mg daily.  He states that his symptoms are well-controlled.  He denies SI/HI.  He has a history of elevated cholesterol and is taking pitavastatin 4 mg daily.  He is not having any side effects to this medication.     12/29/2021    1:52 PM 12/29/2021    1:13 PM 09/10/2021    9:20 AM 04/14/2021   10:31 AM 10/09/2020    3:42 PM  Depression screen PHQ 2/9  Decreased Interest 1 0 0 0 0  Down, Depressed, Hopeless 1 0 0 0 0  PHQ - 2 Score 2 0 0 0 0  Altered sleeping 1  0 0 1  Tired, decreased energy 1  1 0 1  Change in appetite 1  0 0 0  Feeling bad or failure about yourself  1  0 0 0  Trouble concentrating 0  1 0 1  Moving slowly or fidgety/restless 0  0 0 1  Suicidal thoughts   0 0 0  PHQ-9 Score 6  2 0 4  Difficult doing work/chores   Not difficult at all Not difficult at all Somewhat difficult       12/29/2021    1:52 PM 08/27/2019   10:16 AM 11/09/2018   10:40 AM 05/22/2018   11:05 AM  GAD 7 : Generalized Anxiety Score  Nervous, Anxious, on Edge 0 '1 2 1  '$ Control/stop worrying 0 '1 1 1  '$ Worry too much - different things 0 '1 1 1  '$ Trouble relaxing 0 0 1 1  Restless 0 '1 1 1  '$ Easily annoyed or irritable 0 0 0 1  Afraid - awful might happen 0 0 1 0  Total GAD 7 Score 0 '4 7 6  '$ Anxiety Difficulty  Somewhat difficult Somewhat difficult Somewhat difficult   Past Medical History:  Diagnosis Date   Allergy    Anxiety    BPH (benign prostatic hyperplasia)    Bulging lumbar disc    Cataract    Colon polyps    Depression    GERD (gastroesophageal reflux disease)    Hyperlipidemia    Hypertension    Spinal stenosis     Past Surgical History:  Procedure Laterality Date  broken nose   1974   COLONOSCOPY     I & D EXTREMITY Left 12/18/2012   Procedure: I&D Left Middle Finger With Repair Recons.Prn;  Surgeon: Roseanne Kaufman, MD;  Location: [facility];  Service: Orthopedics;  Laterality: Left;   I & D EXTREMITY Left 12/20/2012   Procedure: LEFT HAND Incision and drainage  TO INCLUDE MIDDLE FINGER FLEXOR TENDON;  Surgeon: Roseanne Kaufman, MD;  Location: [facility];  Service: Orthopedics;  Laterality: Left;   I & D EXTREMITY Left 12/22/2012   Procedure: IRRIGATION AND DEBRIDEMENT HAND;  Surgeon: Roseanne Kaufman, MD;  Location: [facility];  Service: Orthopedics;  Laterality: Left;   REFRACTIVE SURGERY     TYMPANIC MEMBRANE REPAIR     WISDOM TOOTH EXTRACTION      Family History  Problem Relation Age of Onset   Kidney cancer Mother 42       Deceased   Kidney disease Mother    Hypertension Father 21       Deceased   Neuropathy Father    Breast cancer Sister    Diabetes Brother    Healthy Brother        x1   Healthy Daughter        x2   Kidney cancer Maternal Grandmother    Cancer Other        Aunts & Uncles   Colon cancer Other    Esophageal cancer Neg Hx    Rectal cancer Neg Hx    Stomach  cancer Neg Hx      Social History   Tobacco Use   Smoking status: Former    Years: 10.00    Types: Cigarettes    Quit date: 2000    Years since quitting: 23.9   Smokeless tobacco: Never  Vaping Use   Vaping Use: Never used  Substance Use Topics   Alcohol use: Yes    Alcohol/week: 0.0 standard drinks of alcohol    Comment: social   Drug use: No    Current Outpatient Medications on File Prior to Visit  Medication Sig Dispense Refill   buPROPion (WELLBUTRIN XL) 300 MG 24 hr tablet Take 1 tablet (300 mg total) by mouth daily. 90 tablet 3   carboxymethylcellulose 1 % ophthalmic solution 1 drop 2 times daily.     cetirizine (ZYRTEC) 10 MG tablet Take 1 tablet (10 mg total) by mouth daily. 90 tablet 3   [employer] SUNSCREEN SPF 30 EX apply topically to face and body daily for 30     Emollient (CERAVE MOISTURIZING) CREA See admin instructions.     finasteride (PROSCAR) 5 MG tablet Take 5 mg by mouth daily.  3   metoprolol tartrate (LOPRESSOR) 50 MG tablet Take 1 tablet (50 mg total) by mouth 2 (two) times daily. 180 tablet 1   Multiple Vitamins-Minerals (MULTIVITAMIN WITH MINERALS) tablet Take by mouth.     omeprazole (PRILOSEC) 20 MG capsule TAKE 1 CAPSULE BY MOUTH EVERY DAY 90 capsule 3   Pitavastatin Calcium (LIVALO) 4 MG TABS Take 1 tablet (4 mg total) by mouth daily. 90 tablet 1   sertraline (ZOLOFT) 50 MG tablet Take 1 tablet (50 mg total) by mouth daily. 90 tablet 3   sildenafil (REVATIO) 20 MG tablet TAKE 1 TABLET (20 MG TOTAL) 3 (THREE) TIMES DAILY BY MOUTH. 10 tablet 5   traMADol (ULTRAM) 50 MG tablet Take 50 mg by mouth every 6 (six) hours as needed.     triamcinolone cream (KENALOG) 0.1 % SMARTSIG:1 Application  Topical 2-3 Times Daily     triamterene-hydrochlorothiazide (MAXZIDE-25) 37.5-25 MG tablet Take 1 tablet by mouth daily. 90 tablet 1   methocarbamol (ROBAXIN) 500 MG tablet Take 1 tablet (500 mg total) by mouth every 8 (eight) hours as needed for muscle spasms. (Patient not  taking: Reported on 12/29/2021) 45 tablet 0   No current facility-administered medications on file prior to visit.     Review of Systems  Constitutional: Negative.   HENT: Negative.    Eyes: Negative.   Respiratory: Negative.    Cardiovascular: Negative.   Gastrointestinal: Negative.   Endocrine: Negative.   Genitourinary: Negative.   Musculoskeletal: Negative.   Skin: Negative.   Neurological: Negative.   Psychiatric/Behavioral: Negative.        Objective:    BP (!) 142/78 (BP Location: Right Arm, Cuff Size: Large)   Pulse 75   Temp 97.6 F (36.4 C) (Temporal)   Ht '5\' 10"'$  (1.778 m)   Wt 238 lb 9.6 oz (108.2 kg)   SpO2 97%   BMI 34.24 kg/m   Wt Readings from Last 3 Encounters:  12/29/21 238 lb 9.6 oz (108.2 kg)  09/10/21 234 lb 6 oz (106.3 kg)  04/14/21 241 lb 12.8 oz (109.7 kg)    BP Readings from Last 3 Encounters:  12/29/21 (!) 142/78  09/10/21 138/88  04/14/21 140/72    Physical Exam Vitals and nursing note reviewed.  Constitutional:      Appearance: Normal appearance.  HENT:     Head: Normocephalic.     Right Ear: Tympanic membrane, ear canal and external ear normal.     Left Ear: Tympanic membrane, ear canal and external ear normal.  Eyes:     Conjunctiva/sclera: Conjunctivae normal.  Cardiovascular:     Rate and Rhythm: Normal rate and regular rhythm.     Pulses: Normal pulses.     Heart sounds: Normal heart sounds.  Pulmonary:     Effort: Pulmonary effort is normal.     Breath sounds: Normal breath sounds.  Abdominal:     Palpations: Abdomen is soft.     Tenderness: There is no abdominal tenderness.  Musculoskeletal:     Cervical back: Normal range of motion and neck supple. No tenderness.     Right lower leg: No edema.     Left lower leg: No edema.  Lymphadenopathy:     Cervical: No cervical adenopathy.  Skin:    General: Skin is warm.  Neurological:     General: No focal deficit present.     Mental Status: He is alert and oriented to  person, place, and time.  Psychiatric:        Mood and Affect: Mood normal.        Behavior: Behavior normal.        Thought Content: Thought content normal.        Judgment: Judgment normal.       Assessment & Plan:   Problem List Items Addressed This Visit       Cardiovascular and Mediastinum   Essential hypertension    Chronic, stable.  BP today slightly elevated at 142/78.  Encouraged him to start checking his blood pressure at home.  Will continue metoprolol 50 mg twice a day and triamterene-HCTZ 37.5-25 mg daily.  Check CMP, CBC.  If blood pressure still elevated next visit, will consider increasing medication.  Follow-up in 6 months.      Relevant Orders   CBC with Differential/Platelet   Comprehensive metabolic panel  Lipid panel     Digestive   GERD    Chronic, stable.  Continue omeprazole 20 mg daily.        Genitourinary   BPH (benign prostatic hyperplasia)    Chronic, stable.  Continue finasteride 5 mg daily.  Will check PSA levels today.      Relevant Orders   PSA     Other   Hyperlipidemia    Chronic, stable.  Continue Livalo 4 mg daily.  Will check CMP and lipid panel today.      Relevant Orders   CBC with Differential/Platelet   Comprehensive metabolic panel   Lipid panel   Anxiety and depression    Chronic, stable.  He is doing well on Wellbutrin XL 300 mg daily and sertraline 50 mg daily.  Will continue this regimen.  Follow-up in 6 months.      Back pain    Chronic, stable.  He had a flareup of back pain when he was doing some heavy lifting over the summer.  This has resolved and is now stable again.  Follow-up as symptoms worsen or with any concerns.      Relevant Medications   traMADol (ULTRAM) 50 MG tablet   Obesity (BMI 30-39.9)    BMI 34. Discussed exercise, nutrition.       Other Visit Diagnoses     Routine general medical examination at a health care facility    -  Primary   Health maintenance reviewed and updated.  Discussed  nutrition, exercise.  Check CMP, CBC.  Follow-up 1 year   Elevated glucose       Glucose was elevated in the past, will check A1c today.   Relevant Orders   Hemoglobin A1c       LABORATORY TESTING:  Health maintenance labs ordered today as discussed above.   The natural history of prostate cancer and ongoing controversy regarding screening and potential treatment outcomes of prostate cancer has been discussed with the patient. The meaning of a false positive PSA and a false negative PSA has been discussed. He indicates understanding of the limitations of this screening test and wishes to proceed with screening PSA testing.   IMMUNIZATIONS:   - Tdap: Tetanus vaccination status reviewed: last tetanus booster within 10 years. - Influenza: Up to date - Pneumovax: Up to date - Prevnar: Up to date - HPV: Not applicable - Zostavax vaccine: Up to date  SCREENING: - Colonoscopy: Up to date  Discussed with patient purpose of the colonoscopy is to detect colon cancer at curable precancerous or early stages   - AAA Screening: Not applicable  -Hearing Test: Not applicable  -Spirometry: Not applicable   PATIENT COUNSELING:    Sexuality: Discussed sexually transmitted diseases, partner selection, use of condoms, avoidance of unintended pregnancy  and contraceptive alternatives.   Advised to avoid cigarette smoking.  I discussed with the patient that most people either abstain from alcohol or drink within safe limits (<=14/week and <=4 drinks/occasion for males, <=7/weeks and <= 3 drinks/occasion for females) and that the risk for alcohol disorders and other health effects rises proportionally with the number of drinks per week and how often a drinker exceeds daily limits.  Discussed cessation/primary prevention of drug use and availability of treatment for abuse.   Diet: Encouraged to adjust caloric intake to maintain  or achieve ideal body weight, to reduce intake of dietary saturated fat  and total fat, to limit sodium intake by avoiding high sodium foods and not adding  table salt, and to maintain adequate dietary potassium and calcium preferably from fresh fruits, vegetables, and low-fat dairy products.    stressed the importance of regular exercise  Injury prevention: Discussed safety belts, safety helmets, smoke detector, smoking near bedding or upholstery.   Dental health: Discussed importance of regular tooth brushing, flossing, and dental visits.   Follow up plan: NEXT PREVENTATIVE PHYSICAL DUE IN 1 YEAR.  Follow up plan: Return in about 6 months (around 06/29/2022) for HTN, HLD.

## 2022-01-05 ENCOUNTER — Other Ambulatory Visit (INDEPENDENT_AMBULATORY_CARE_PROVIDER_SITE_OTHER): Payer: PPO

## 2022-01-05 DIAGNOSIS — I10 Essential (primary) hypertension: Secondary | ICD-10-CM

## 2022-01-05 DIAGNOSIS — N401 Enlarged prostate with lower urinary tract symptoms: Secondary | ICD-10-CM

## 2022-01-05 DIAGNOSIS — R7309 Other abnormal glucose: Secondary | ICD-10-CM

## 2022-01-05 DIAGNOSIS — E782 Mixed hyperlipidemia: Secondary | ICD-10-CM

## 2022-01-05 LAB — CBC WITH DIFFERENTIAL/PLATELET
Basophils Absolute: 0.1 10*3/uL (ref 0.0–0.1)
Basophils Relative: 0.9 % (ref 0.0–3.0)
Eosinophils Absolute: 0.4 10*3/uL (ref 0.0–0.7)
Eosinophils Relative: 4.4 % (ref 0.0–5.0)
HCT: 45.3 % (ref 39.0–52.0)
Hemoglobin: 15.3 g/dL (ref 13.0–17.0)
Lymphocytes Relative: 40.5 % (ref 12.0–46.0)
Lymphs Abs: 3.6 10*3/uL (ref 0.7–4.0)
MCHC: 33.8 g/dL (ref 30.0–36.0)
MCV: 96.2 fl (ref 78.0–100.0)
Monocytes Absolute: 0.6 10*3/uL (ref 0.1–1.0)
Monocytes Relative: 7.3 % (ref 3.0–12.0)
Neutro Abs: 4.1 10*3/uL (ref 1.4–7.7)
Neutrophils Relative %: 46.9 % (ref 43.0–77.0)
Platelets: 233 10*3/uL (ref 150.0–400.0)
RBC: 4.71 Mil/uL (ref 4.22–5.81)
RDW: 14.3 % (ref 11.5–15.5)
WBC: 8.8 10*3/uL (ref 4.0–10.5)

## 2022-01-05 LAB — COMPREHENSIVE METABOLIC PANEL
ALT: 16 U/L (ref 0–53)
AST: 16 U/L (ref 0–37)
Albumin: 4.4 g/dL (ref 3.5–5.2)
Alkaline Phosphatase: 76 U/L (ref 39–117)
BUN: 12 mg/dL (ref 6–23)
CO2: 30 mEq/L (ref 19–32)
Calcium: 9.1 mg/dL (ref 8.4–10.5)
Chloride: 101 mEq/L (ref 96–112)
Creatinine, Ser: 1.01 mg/dL (ref 0.40–1.50)
GFR: 76.58 mL/min (ref >60.00)
Glucose, Bld: 112 mg/dL — ABNORMAL HIGH (ref 70–99)
Potassium: 4 mEq/L (ref 3.5–5.1)
Sodium: 138 mEq/L (ref 135–145)
Total Bilirubin: 0.8 mg/dL (ref 0.2–1.2)
Total Protein: 6.7 g/dL (ref 6.0–8.3)

## 2022-01-05 LAB — LIPID PANEL
Cholesterol: 230 mg/dL — ABNORMAL HIGH (ref 0–200)
HDL: 48.4 mg/dL (ref >39.00)
NonHDL: 182.03
Total CHOL/HDL Ratio: 5
Triglycerides: 247 mg/dL — ABNORMAL HIGH (ref 0.0–149.0)
VLDL: 49.4 mg/dL — ABNORMAL HIGH (ref 0.0–40.0)

## 2022-01-05 LAB — LDL CHOLESTEROL, DIRECT: Direct LDL: 132 mg/dL

## 2022-01-05 LAB — HEMOGLOBIN A1C: Hgb A1c MFr Bld: 5.7 % (ref 4.6–6.5)

## 2022-01-05 LAB — PSA: PSA: 0.14 ng/mL (ref 0.10–4.00)

## 2022-01-08 ENCOUNTER — Encounter: Payer: Self-pay | Admitting: Nurse Practitioner

## 2022-02-11 ENCOUNTER — Telehealth: Payer: Self-pay | Admitting: Nurse Practitioner

## 2022-02-11 NOTE — Telephone Encounter (Signed)
Left message for patient to call back and schedule Medicare Annual Wellness Visit (AWV) in office.   If not able to come in office, please offer to do virtually or by telephone.  Left office number and my jabber [phone].  Last AWV:12/01/2020  Please schedule at anytime with Nurse Health Advisor.

## 2022-03-24 ENCOUNTER — Telehealth: Payer: Self-pay | Admitting: Nurse Practitioner

## 2022-03-24 NOTE — Telephone Encounter (Signed)
[facility] to schedule their annual wellness visit. Appointment made for 04/01/22.  Barkley Boards AWV direct phone # [phone]*

## 2022-03-24 NOTE — Telephone Encounter (Signed)
Called patient to schedule Medicare Annual Wellness Visit (AWV). Left message for patient to call back and schedule Medicare Annual Wellness Visit (AWV).  Last date of AWV:   DUE 12/01/2020 AWVI PER [facility]  Please schedule an appointment at any time with [facility] .  If any questions, please contact me at [phone].  Thank you ,  Barkley Boards AWV direct phone # [phone]

## 2022-04-01 ENCOUNTER — Ambulatory Visit (INDEPENDENT_AMBULATORY_CARE_PROVIDER_SITE_OTHER): Payer: Medicare Other

## 2022-04-01 VITALS — Ht 72.0 in | Wt 235.0 lb

## 2022-04-01 DIAGNOSIS — Z0000 Encounter for general adult medical examination without abnormal findings: Secondary | ICD-10-CM

## 2022-04-01 NOTE — Progress Notes (Signed)
I connected with  Lauretta Grill on 04/01/22 by a audio enabled telemedicine application and verified that I am speaking with the correct person using two identifiers.  Patient Location: Home  Provider Location: Office/Clinic  I discussed the limitations of evaluation and management by telemedicine. The patient expressed understanding and agreed to proceed.  Subjective:   Antonio Cooper is a 67 y.o. male who presents for an Initial Medicare Annual Wellness Visit.  Review of Systems     Cardiac Risk Factors include: advanced age (>42mn, >>75women);dyslipidemia;hypertension;male gender;obesity (BMI >30kg/m2)     Objective:    Today's Vitals   04/01/22 1401 04/01/22 1402  Weight: 235 lb (106.6 kg)   Height: 6' (1.829 m)   PainSc:  3    Body mass index is 31.87 kg/m.     04/01/2022    2:10 PM 01/06/2020   10:37 AM 12/22/2012   11:20 AM 12/22/2012    6:26 AM 12/20/2012    2:35 PM 12/19/2012    2:28 AM 12/18/2012    6:02 PM  Advanced Directives  Does Patient Have a Medical Advance Directive? No Yes Patient has advance directive, copy not in chart Patient has advance directive, copy not in chart Patient has advance directive, copy not in chart Patient has advance directive, copy not in chart   Type of Advance Directive  [place] will  [place] will   Does patient want to make changes to medical advance directive?  Yes (ED - Information included in AVS)    Yes   Copy of [place] Chart?  No - copy requested Copy requested from family  Copy requested from family Copy requested from family   Pre-existing out of facility DNR order (yellow form or pink MOST form)   No  No No No    Current Medications (verified) Outpatient Encounter Medications as of 04/01/2022  Medication Sig   buPROPion (WELLBUTRIN XL) 300 MG 24 hr tablet Take  1 tablet (300 mg total) by mouth daily.   carboxymethylcellulose 1 % ophthalmic solution 1 drop 2 times daily.   cetirizine (ZYRTEC) 10 MG tablet Take 1 tablet (10 mg total) by mouth daily.   [employer] SUNSCREEN SPF 30 EX apply topically to face and body daily for 30   Emollient (CERAVE MOISTURIZING) CREA See admin instructions.   finasteride (PROSCAR) 5 MG tablet Take 5 mg by mouth daily.   methocarbamol (ROBAXIN) 500 MG tablet Take 1 tablet (500 mg total) by mouth every 8 (eight) hours as needed for muscle spasms.   metoprolol tartrate (LOPRESSOR) 50 MG tablet Take 1 tablet (50 mg total) by mouth 2 (two) times daily.   Multiple Vitamins-Minerals (MULTIVITAMIN WITH MINERALS) tablet Take by mouth.   omeprazole (PRILOSEC) 20 MG capsule TAKE 1 CAPSULE BY MOUTH EVERY DAY   Pitavastatin Calcium (LIVALO) 4 MG TABS Take 1 tablet (4 mg total) by mouth daily.   sertraline (ZOLOFT) 50 MG tablet Take 1 tablet (50 mg total) by mouth daily.   traMADol (ULTRAM) 50 MG tablet Take 50 mg by mouth every 6 (six) hours as needed.   triamcinolone cream (KENALOG) 0.1 % SMARTSIG:1 Application Topical 2-3 Times Daily   triamterene-hydrochlorothiazide (MAXZIDE-25) 37.5-25 MG tablet Take 1 tablet by mouth daily.   sildenafil (REVATIO) 20 MG tablet TAKE 1 TABLET (20 MG TOTAL) 3 (THREE) TIMES DAILY BY MOUTH.   No facility-administered encounter medications  on file as of 04/01/2022.    Allergies (verified) Patient has no known allergies.   History: Past Medical History:  Diagnosis Date   Allergy    Anxiety    BPH (benign prostatic hyperplasia)    Bulging lumbar disc    Cataract    Colon polyps    Depression    GERD (gastroesophageal reflux disease)    Hyperlipidemia    Hypertension    Spinal stenosis    Past Surgical History:  Procedure Laterality Date   broken nose   1974   COLONOSCOPY     I & D EXTREMITY Left 12/18/2012   Procedure: I&D Left Middle Finger With Repair Recons.Prn;  Surgeon: Roseanne Kaufman,  MD;  Location: [facility];  Service: Orthopedics;  Laterality: Left;   I & D EXTREMITY Left 12/20/2012   Procedure: LEFT HAND Incision and drainage  TO INCLUDE MIDDLE FINGER FLEXOR TENDON;  Surgeon: Roseanne Kaufman, MD;  Location: [facility];  Service: Orthopedics;  Laterality: Left;   I & D EXTREMITY Left 12/22/2012   Procedure: IRRIGATION AND DEBRIDEMENT HAND;  Surgeon: Roseanne Kaufman, MD;  Location: [facility];  Service: Orthopedics;  Laterality: Left;   REFRACTIVE SURGERY     TYMPANIC MEMBRANE REPAIR     WISDOM TOOTH EXTRACTION     Family History  Problem Relation Age of Onset   Kidney cancer Mother 44       Deceased   Kidney disease Mother    Hypertension Father 26       Deceased   Neuropathy Father    Breast cancer Sister    Diabetes Brother    Healthy Brother        x1   Healthy Daughter        x2   Kidney cancer Maternal Grandmother    Cancer Other        Aunts & Uncles   Colon cancer Other    Esophageal cancer Neg Hx    Rectal cancer Neg Hx    Stomach cancer Neg Hx    Social History   Socioeconomic History   Marital status: Single    Spouse name: Not on file   Number of children: 2   Years of education: Not on file   Highest education level: Not on file  Occupational History   Occupation: CFO  Tobacco Use   Smoking status: Former    Years: 10.00    Types: Cigarettes    Quit date: 2000    Years since quitting: 24.1   Smokeless tobacco: Never  Vaping Use   Vaping Use: Never used  Substance and Sexual Activity   Alcohol use: Yes    Alcohol/week: 0.0 standard drinks of alcohol    Comment: social   Drug use: No   Sexual activity: Yes  Other Topics Concern   Not on file  Social History Narrative   Not on file   Social Determinants of Health   Financial Resource Strain: Low Risk  (04/01/2022)   Overall Financial Resource Strain (CARDIA)    Difficulty of Paying Living Expenses: Not hard at all  Food Insecurity: No Food Insecurity (04/01/2022)   Hunger Vital Sign     Worried About Running Out of Food in the Last Year: Never true    Ran Out of Food in the Last Year: Never true  Transportation Needs: No Transportation Needs (04/01/2022)   PRAPARE - Hydrologist (Medical): No    Lack of Transportation (Non-Medical): No  Physical Activity: Insufficiently Active (04/01/2022)   Exercise Vital Sign    Days of Exercise per Week: 3 days    Minutes of Exercise per Session: 30 min  Stress: Stress Concern Present (04/01/2022)   [place]    Feeling of Stress : To some extent  Social Connections: Not on file    Tobacco Counseling Counseling given: Not Answered   Clinical Intake:  Pre-visit preparation completed: Yes  Pain : 0-10 Pain Score: 3  Pain Type: Chronic pain Pain Location: Back Pain Orientation: Lower Pain Descriptors / Indicators: Sharp, Aching Pain Onset: More than a month ago Pain Frequency: Constant     Nutritional Status: BMI > 30  Obese Nutritional Risks: None Diabetes: No  How often do you need to have someone help you when you read instructions, pamphlets, or other written materials from your doctor or pharmacy?: 1 - Never  Diabetic? no     Information entered by :: NAllen LPN   Activities of Daily Living    04/01/2022    2:13 PM 03/31/2022    7:06 PM  In your present state of health, do you have any difficulty performing the following activities:  Hearing? 0 0  Vision? 0 0  Difficulty concentrating or making decisions? 1 1  Walking or climbing stairs? 0 0  Dressing or bathing? 0 0  Doing errands, shopping? 0 0  Preparing Food and eating ? N N  Using the Toilet? N N  In the past six months, have you accidently leaked urine? Y Y  Do you have problems with loss of bowel control? N N  Managing your Medications? N N  Managing your Finances? N N  Housekeeping or managing your Housekeeping? N N    Patient Care Team: Charyl Dancer, NP as PCP - General (Internal Medicine) Madelin Rear, [facility] (Inactive) as Pharmacist (Pharmacist)  Indicate any recent Medical Services you may have received from other than [facility] providers in the past year (date may be approximate).     Assessment:   This is a routine wellness examination for [place].  Hearing/Vision screen Vision Screening - Comments:: Regular eye exams, [facility]  Dietary issues and exercise activities discussed: Current Exercise Habits: Home exercise routine, Type of exercise: walking, Time (Minutes): 30, Frequency (Times/Week): 3, Weekly Exercise (Minutes/Week): 90   Goals Addressed             This Visit's Progress    Patient Stated       04/01/2022, wants to lose weight, start back to the gym       Depression Screen    04/01/2022    2:12 PM 12/29/2021    1:52 PM 12/29/2021    1:13 PM 09/10/2021    9:20 AM 04/14/2021   10:31 AM 10/09/2020    3:42 PM 01/06/2020   10:35 AM  PHQ 2/9 Scores  PHQ - 2 Score 0 2 0 0 0 0 0  PHQ- 9 Score  6  2 0 4     Fall Risk    04/01/2022    2:12 PM 03/31/2022    7:06 PM 12/29/2021    1:13 PM 09/10/2021    9:20 AM 04/14/2021   10:31 AM  [place] in the past year? 0 0 0 0 0  Number falls in past yr: 0  0 0 0  Injury with Fall? 0  0 0 0  Risk for fall due to :  Medication side effect   No Fall Risks No Fall Risks  Follow up Falls prevention discussed;Education provided;Falls evaluation completed   Falls evaluation completed Falls evaluation completed    FALL RISK PREVENTION PERTAINING TO THE HOME:  Any stairs in or around the home? Yes  If so, are there any without handrails? No  Home free of loose throw rugs in walkways, pet beds, electrical cords, etc? Yes  Adequate lighting in your home to reduce risk of falls? Yes   ASSISTIVE DEVICES UTILIZED TO PREVENT FALLS:  Life alert? No  Use of a cane, walker or w/c? No  Grab bars in the bathroom? Yes  Shower chair or bench in shower? No  Elevated toilet  seat or a handicapped toilet? Yes   TIMED UP AND GO:  Was the test performed? No .       Cognitive Function:        04/01/2022    2:15 PM  6CIT Screen  What Year? 0 points  What month? 0 points  What time? 0 points  Count back from 20 0 points  Months in reverse 0 points  Repeat phrase 0 points  Total Score 0 points    Immunizations Immunization History  Administered Date(s) Administered   Fluad Quad(high Dose 65+) 11/09/2018, 01/06/2020, 10/09/2020   Influenza Whole 01/31/2005, 01/04/2007, 02/11/2009   Influenza,inj,Quad PF,6+ Mos 09/27/2013, 10/13/2014, 10/30/2015, 10/31/2016, 11/06/2017   PFIZER(Purple Top)SARS-COV-2 Vaccination 03/11/2019, 04/15/2019, 01/20/2020   Pneumococcal Conjugate-13 11/09/2018   Pneumococcal Polysaccharide-23 01/06/2020   Td 01/31/2005   Tdap 12/18/2012   Zoster Recombinat (Shingrix) 10/09/2020, 10/13/2021    TDAP status: Up to date  Flu Vaccine status: Up to date  Pneumococcal vaccine status: Up to date  Covid-19 vaccine status: Completed vaccines  Qualifies for Shingles Vaccine? Yes   Zostavax completed Yes   Shingrix Completed?: Yes  Screening Tests Health Maintenance  Topic Date Due   Medicare Annual Wellness (AWV)  Never done   DTaP/Tdap/Td (3 - Td or Tdap) 12/19/2022   COLONOSCOPY (Pts 45-8yr Insurance coverage will need to be confirmed)  04/23/2029   Pneumonia Vaccine 69 Years old  Completed   INFLUENZA VACCINE  Completed   Hepatitis C Screening  Completed   HPV VACCINES  Aged Out   COVID-19 Vaccine  Discontinued   Zoster Vaccines- Shingrix  Discontinued    Health Maintenance  Health Maintenance Due  Topic Date Due   Medicare Annual Wellness (AWV)  Never done    Colorectal cancer screening: Type of screening: Colonoscopy. Completed 04/24/2019. Repeat every 10 years  Lung Cancer Screening: (Low Dose CT Chest recommended if Age 69-80years, 30 pack-year currently smoking OR have quit w/in 15years.) does not  qualify.   Lung Cancer Screening Referral: no  Additional Screening:  Hepatitis C Screening: does qualify; Completed 10/13/2014  Vision Screening: Recommended annual ophthalmology exams for early detection of glaucoma and other disorders of the eye. Is the patient up to date with their annual eye exam?  Yes  Who is the provider or what is the name of the [facility] which the patient [facility] If pt is not established with a provider, would they like to be referred to a provider to establish care? No .   Dental Screening: Recommended annual dental exams for proper oral hygiene  Community Resource Referral / Chronic Care Management: CRR required this visit?  No   CCM required this visit?  No      Plan:  I have personally reviewed and noted the following in the patient's chart:   Medical and social history Use of alcohol, tobacco or illicit drugs  Current medications and supplements including opioid prescriptions. Patient is not currently taking opioid prescriptions. Functional ability and status Nutritional status Physical activity Advanced directives List of other physicians Hospitalizations, surgeries, and ER visits in previous 12 months Vitals Screenings to include cognitive, depression, and falls Referrals and appointments  In addition, I have reviewed and discussed with patient certain preventive protocols, quality metrics, and best practice recommendations. A written personalized care plan for preventive services as well as general preventive health recommendations were provided to patient.     Kellie Simmering, LPN   [id number]   Nurse Notes: none  Due to this being a virtual visit, the after visit summary with patients personalized plan was offered to patient via mail or my-chart.  Patient would like to access on my-chart

## 2022-04-01 NOTE — Patient Instructions (Signed)
Antonio Cooper , Thank you for taking time to come for your Medicare Wellness Visit. I appreciate your ongoing commitment to your health goals. Please review the following plan we discussed and let me know if I can assist you in the future.   These are the goals we discussed:  Goals      Patient Stated     04/01/2022, wants to lose weight, start back to the gym        This is a list of the screening recommended for you and due dates:  Health Maintenance  Topic Date Due   DTaP/Tdap/Td vaccine (3 - Td or Tdap) 12/19/2022   Medicare Annual Wellness Visit  04/01/2023   Colon Cancer Screening  04/23/2029   Pneumonia Vaccine  Completed   Flu Shot  Completed   Hepatitis C Screening: USPSTF Recommendation to screen - Ages 18-79 yo.  Completed   HPV Vaccine  Aged Out   COVID-19 Vaccine  Discontinued   Zoster (Shingles) Vaccine  Discontinued    Advanced directives: Advance directive discussed with you today.   Conditions/risks identified: none  Next appointment: Follow up in one year for your annual wellness visit.   Preventive Care 69 Years and Older, Male  Preventive care refers to lifestyle choices and visits with your health care provider that can promote health and wellness. What does preventive care include? A yearly physical exam. This is also called an annual well check. Dental exams once or twice a year. Routine eye exams. Ask your health care provider how often you should have your eyes checked. Personal lifestyle choices, including: Daily care of your teeth and gums. Regular physical activity. Eating a healthy diet. Avoiding tobacco and drug use. Limiting alcohol use. Practicing safe sex. Taking low doses of aspirin every day. Taking vitamin and mineral supplements as recommended by your health care provider. What happens during an annual well check? The services and screenings done by your health care provider during your annual well check will depend on your age, overall  health, lifestyle risk factors, and family history of disease. Counseling  Your health care provider may ask you questions about your: Alcohol use. Tobacco use. Drug use. Emotional well-being. Home and relationship well-being. Sexual activity. Eating habits. History of falls. Memory and ability to understand (cognition). Work and work Statistician. Screening  You may have the following tests or measurements: Height, weight, and BMI. Blood pressure. Lipid and cholesterol levels. These may be checked every 5 years, or more frequently if you are over 52 years old. Skin check. Lung cancer screening. You may have this screening every year starting at age 69 if you have a 30-pack-year history of smoking and currently smoke or have quit within the past 15 years. Fecal occult blood test (FOBT) of the stool. You may have this test every year starting at age 74. Flexible sigmoidoscopy or colonoscopy. You may have a sigmoidoscopy every 5 years or a colonoscopy every 10 years starting at age 40. Prostate cancer screening. Recommendations will vary depending on your family history and other risks. Hepatitis C blood test. Hepatitis B blood test. Sexually transmitted disease (STD) testing. Diabetes screening. This is done by checking your blood sugar (glucose) after you have not eaten for a while (fasting). You may have this done every 1-3 years. Abdominal aortic aneurysm (AAA) screening. You may need this if you are a current or former smoker. Osteoporosis. You may be screened starting at age 85 if you are at high risk. Talk with  your health care provider about your test results, treatment options, and if necessary, the need for more tests. Vaccines  Your health care provider may recommend certain vaccines, such as: Influenza vaccine. This is recommended every year. Tetanus, diphtheria, and acellular pertussis (Tdap, Td) vaccine. You may need a Td booster every 10 years. Zoster vaccine. You may  need this after age 54. Pneumococcal 13-valent conjugate (PCV13) vaccine. One dose is recommended after age 25. Pneumococcal polysaccharide (PPSV23) vaccine. One dose is recommended after age 49. Talk to your health care provider about which screenings and vaccines you need and how often you need them. This information is not intended to replace advice given to you by your health care provider. Make sure you discuss any questions you have with your health care provider. Document Released: 02/13/2015 Document Revised: 10/07/2015 Document Reviewed: 11/18/2014 Elsevier Interactive Patient Education  2017 [place] Prevention in the Home Falls can cause injuries. They can happen to people of all ages. There are many things you can do to make your home safe and to help prevent falls. What can I do on the outside of my home? Regularly fix the edges of walkways and driveways and fix any cracks. Remove anything that might make you trip as you walk through a door, such as a raised step or threshold. Trim any bushes or trees on the path to your home. Use bright outdoor lighting. Clear any walking paths of anything that might make someone trip, such as rocks or tools. Regularly check to see if handrails are loose or broken. Make sure that both sides of any steps have handrails. Any raised decks and porches should have guardrails on the edges. Have any leaves, snow, or ice cleared regularly. Use sand or salt on walking paths during winter. Clean up any spills in your garage right away. This includes oil or grease spills. What can I do in the bathroom? Use night lights. Install grab bars by the toilet and in the tub and shower. Do not use towel bars as grab bars. Use non-skid mats or decals in the tub or shower. If you need to sit down in the shower, use a plastic, non-slip stool. Keep the floor dry. Clean up any water that spills on the floor as soon as it happens. Remove soap buildup in  the tub or shower regularly. Attach bath mats securely with double-sided non-slip rug tape. Do not have throw rugs and other things on the floor that can make you trip. What can I do in the bedroom? Use night lights. Make sure that you have a light by your bed that is easy to reach. Do not use any sheets or blankets that are too big for your bed. They should not hang down onto the floor. Have a firm chair that has side arms. You can use this for support while you get dressed. Do not have throw rugs and other things on the floor that can make you trip. What can I do in the kitchen? Clean up any spills right away. Avoid walking on wet floors. Keep items that you use a lot in easy-to-reach places. If you need to reach something above you, use a strong step stool that has a grab bar. Keep electrical cords out of the way. Do not use floor polish or wax that makes floors slippery. If you must use wax, use non-skid floor wax. Do not have throw rugs and other things on the floor that can make you trip.  What can I do with my stairs? Do not leave any items on the stairs. Make sure that there are handrails on both sides of the stairs and use them. Fix handrails that are broken or loose. Make sure that handrails are as long as the stairways. Check any carpeting to make sure that it is firmly attached to the stairs. Fix any carpet that is loose or worn. Avoid having throw rugs at the top or bottom of the stairs. If you do have throw rugs, attach them to the floor with carpet tape. Make sure that you have a light switch at the top of the stairs and the bottom of the stairs. If you do not have them, ask someone to add them for you. What else can I do to help prevent falls? Wear shoes that: Do not have high heels. Have rubber bottoms. Are comfortable and fit you well. Are closed at the toe. Do not wear sandals. If you use a stepladder: Make sure that it is fully opened. Do not climb a closed  stepladder. Make sure that both sides of the stepladder are locked into place. Ask someone to hold it for you, if possible. Clearly mark and make sure that you can see: Any grab bars or handrails. First and last steps. Where the edge of each step is. Use tools that help you move around (mobility aids) if they are needed. These include: Canes. Walkers. Scooters. Crutches. Turn on the lights when you go into a dark area. Replace any light bulbs as soon as they burn out. Set up your furniture so you have a clear path. Avoid moving your furniture around. If any of your floors are uneven, fix them. If there are any pets around you, be aware of where they are. Review your medicines with your doctor. Some medicines can make you feel dizzy. This can increase your chance of falling. Ask your doctor what other things that you can do to help prevent falls. This information is not intended to replace advice given to you by your health care provider. Make sure you discuss any questions you have with your health care provider. Document Released: 11/13/2008 Document Revised: 06/25/2015 Document Reviewed: 02/21/2014 Elsevier Interactive Patient Education  2017 [employer].

## 2022-04-15 ENCOUNTER — Other Ambulatory Visit: Payer: Self-pay | Admitting: Family Medicine

## 2022-04-19 ENCOUNTER — Other Ambulatory Visit: Payer: Self-pay | Admitting: Family Medicine

## 2022-04-20 ENCOUNTER — Encounter: Payer: Self-pay | Admitting: Nurse Practitioner

## 2022-04-20 DIAGNOSIS — F32A Depression, unspecified: Secondary | ICD-10-CM

## 2022-04-22 ENCOUNTER — Other Ambulatory Visit (HOSPITAL_COMMUNITY): Payer: Self-pay

## 2022-04-27 ENCOUNTER — Other Ambulatory Visit (HOSPITAL_COMMUNITY): Payer: Self-pay

## 2022-04-28 ENCOUNTER — Other Ambulatory Visit: Payer: Self-pay

## 2022-04-28 DIAGNOSIS — K2100 Gastro-esophageal reflux disease with esophagitis, without bleeding: Secondary | ICD-10-CM

## 2022-04-28 MED ORDER — OMEPRAZOLE 20 MG PO CPDR: DELAYED_RELEASE_CAPSULE | ORAL | 3 refills | Status: DC

## 2022-04-28 MED ORDER — BUPROPION HCL ER (XL) 300 MG PO TB24: 300.0000 mg | ORAL_TABLET | Freq: Every day | ORAL | 3 refills | Status: DC

## 2022-04-30 MED ORDER — TRIAMTERENE-HCTZ 37.5-25 MG PO TABS: 1.0000 | ORAL_TABLET | Freq: Every day | ORAL | 1 refills | Status: DC

## 2022-04-30 NOTE — Addendum Note (Signed)
Addended by: Vance Peper A on: 04/30/2022 11:41 AM   Modules accepted: Orders

## 2022-05-16 ENCOUNTER — Encounter: Payer: Self-pay | Admitting: *Deleted

## 2022-06-29 ENCOUNTER — Encounter: Payer: Self-pay | Admitting: Nurse Practitioner

## 2022-06-29 ENCOUNTER — Ambulatory Visit (INDEPENDENT_AMBULATORY_CARE_PROVIDER_SITE_OTHER): Payer: Medicare Other | Admitting: Nurse Practitioner

## 2022-06-29 VITALS — BP 136/84 | HR 72 | Ht 72.0 in | Wt 243.0 lb

## 2022-06-29 DIAGNOSIS — F419 Anxiety disorder, unspecified: Secondary | ICD-10-CM

## 2022-06-29 DIAGNOSIS — E782 Mixed hyperlipidemia: Secondary | ICD-10-CM

## 2022-06-29 DIAGNOSIS — M79604 Pain in right leg: Secondary | ICD-10-CM | POA: Diagnosis not present

## 2022-06-29 DIAGNOSIS — I10 Essential (primary) hypertension: Secondary | ICD-10-CM

## 2022-06-29 DIAGNOSIS — F32A Depression, unspecified: Secondary | ICD-10-CM

## 2022-06-29 DIAGNOSIS — I700 Atherosclerosis of aorta: Secondary | ICD-10-CM

## 2022-06-29 LAB — LIPID PANEL
Cholesterol: 195 mg/dL (ref 0–200)
HDL: 49.9 mg/dL (ref >39.00)
NonHDL: 144.94
Total CHOL/HDL Ratio: 4
Triglycerides: 202 mg/dL — ABNORMAL HIGH (ref 0.0–149.0)
VLDL: 40.4 mg/dL — ABNORMAL HIGH (ref 0.0–40.0)

## 2022-06-29 LAB — COMPREHENSIVE METABOLIC PANEL
ALT: 22 U/L (ref 0–53)
AST: 18 U/L (ref 0–37)
Albumin: 4.3 g/dL (ref 3.5–5.2)
Alkaline Phosphatase: 68 U/L (ref 39–117)
BUN: 17 mg/dL (ref 6–23)
CO2: 28 mEq/L (ref 19–32)
Calcium: 9 mg/dL (ref 8.4–10.5)
Chloride: 103 mEq/L (ref 96–112)
Creatinine, Ser: 1.02 mg/dL (ref 0.40–1.50)
GFR: 75.43 mL/min (ref >60.00)
Glucose, Bld: 113 mg/dL — ABNORMAL HIGH (ref 70–99)
Potassium: 3.7 mEq/L (ref 3.5–5.1)
Sodium: 139 mEq/L (ref 135–145)
Total Bilirubin: 0.6 mg/dL (ref 0.2–1.2)
Total Protein: 6.8 g/dL (ref 6.0–8.3)

## 2022-06-29 LAB — CBC
HCT: 43.5 % (ref 39.0–52.0)
Hemoglobin: 14.5 g/dL (ref 13.0–17.0)
MCHC: 33.4 g/dL (ref 30.0–36.0)
MCV: 96.1 fl (ref 78.0–100.0)
Platelets: 225 10*3/uL (ref 150.0–400.0)
RBC: 4.52 Mil/uL (ref 4.22–5.81)
RDW: 13.8 % (ref 11.5–15.5)
WBC: 7.4 10*3/uL (ref 4.0–10.5)

## 2022-06-29 LAB — LDL CHOLESTEROL, DIRECT: Direct LDL: 115 mg/dL

## 2022-06-29 MED ORDER — SERTRALINE HCL 50 MG PO TABS: 50.0000 mg | ORAL_TABLET | Freq: Every day | ORAL | 3 refills | Status: DC

## 2022-06-29 MED ORDER — METOPROLOL TARTRATE 50 MG PO TABS: 50.0000 mg | ORAL_TABLET | Freq: Two times a day (BID) | ORAL | 1 refills | Status: DC

## 2022-06-29 NOTE — Assessment & Plan Note (Signed)
Chronic, stable.  BP today 136/84.  Continue metoprolol 50 mg twice a day and triamterene-HCTZ 37.5-25 mg daily.  Check CMP, CBC, lipid panel today. Follow-up in 6 months.

## 2022-06-29 NOTE — Assessment & Plan Note (Signed)
Noted on CT scan 05/07/15. Continue livalo 4mg  daily. Check lipid panel today.

## 2022-06-29 NOTE — Assessment & Plan Note (Signed)
Chronic, ongoing. His cholesterol was elevated last visit, however he states he was not taking his medication regularly. He has been taking it daily for the last several months. Continue Livalo 4 mg daily.  Will check CMP, CBC and lipid panel today.

## 2022-06-29 NOTE — Patient Instructions (Signed)
It was great to see you!  We are checking your labs today and will let you know the results via mychart/phone.   I have refilled your medications.   Let's follow-up in 6 months, sooner if you have concerns.  If a referral was placed today, you will be contacted for an appointment. Please note that routine referrals can sometimes take up to 3-4 weeks to process. Please call our office if you haven't heard anything after this time frame.  Take care,  Eswin Worrell, NP  

## 2022-06-29 NOTE — Progress Notes (Signed)
Established Patient Office Visit  Subjective   Patient ID: Antonio Cooper, male    DOB: [birth year]  Age: 67 y.o. MRN: [id number]  Chief Complaint  Patient presents with   Hypertension    6 month follow up on HTN and HLD  and patient is fasting, Rx refills    HPI  Antonio Cooper is here to follow-up on hypertension and hyperlipidemia.  He also notes that he has been having a pain in his right medial calf that has been intermittent.  He notes it when he first wakes up in the morning and if he sits down for a long period of time.  He states that it is sharp and will go away after a few minutes.  He does take ibuprofen which helps as well.  He denies swelling, changes in his skin.  HYPERTENSION / HYPERLIPIDEMIA  Satisfied with current treatment? yes Duration of hypertension: chronic BP medication side effects: no Past BP meds: metoprolol, triamterene, hctz Duration of hyperlipidemia: chronic Cholesterol medication side effects: no Cholesterol supplements: none Past cholesterol medications: Livalo Medication compliance: excellent compliance Aspirin: no Recent stressors: no Recurrent headaches: no Visual changes: no Palpitations: no Dyspnea: no Chest pain: no Lower extremity edema: no Dizzy/lightheaded: no    ROS See pertinent positives and negatives per HPI.    Objective:     BP 136/84 (BP Location: Left Arm)   Pulse 72   Ht 6' (1.829 m)   Wt 243 lb (110.2 kg)   SpO2 95%   BMI 32.96 kg/m  BP Readings from Last 3 Encounters:  06/29/22 136/84  12/29/21 (!) 142/78  09/10/21 138/88   Wt Readings from Last 3 Encounters:  06/29/22 243 lb (110.2 kg)  04/01/22 235 lb (106.6 kg)  12/29/21 238 lb 9.6 oz (108.2 kg)      Physical Exam Vitals and nursing note reviewed.  Constitutional:      Appearance: Normal appearance.  HENT:     Head: Normocephalic.  Eyes:     Conjunctiva/sclera: Conjunctivae normal.  Cardiovascular:     Rate and Rhythm: Normal rate and  regular rhythm.     Pulses: Normal pulses.     Heart sounds: Normal heart sounds.  Pulmonary:     Effort: Pulmonary effort is normal.     Breath sounds: Normal breath sounds.  Abdominal:     Palpations: Abdomen is soft.     Tenderness: There is no abdominal tenderness.  Musculoskeletal:        General: Tenderness (right medial calf) present. No swelling.     Cervical back: Normal range of motion.  Skin:    General: Skin is warm.  Neurological:     General: No focal deficit present.     Mental Status: He is alert and oriented to person, place, and time.  Psychiatric:        Mood and Affect: Mood normal.        Behavior: Behavior normal.        Thought Content: Thought content normal.        Judgment: Judgment normal.    The 10-year ASCVD risk score (Arnett DK, et al., 2019) is: 27.7%    Assessment & Plan:   Problem List Items Addressed This Visit       Cardiovascular and Mediastinum   Essential hypertension - Primary    Chronic, stable.  BP today 136/84.  Continue metoprolol 50 mg twice a day and triamterene-HCTZ 37.5-25 mg daily.  Check CMP, CBC, lipid  panel today. Follow-up in 6 months.      Relevant Medications   metoprolol tartrate (LOPRESSOR) 50 MG tablet   Other Relevant Orders   CBC   Comprehensive metabolic panel   Lipid panel   Aortic atherosclerosis (HCC)    Noted on CT scan 05/07/15. Continue livalo 4mg  daily. Check lipid panel today.       Relevant Medications   metoprolol tartrate (LOPRESSOR) 50 MG tablet     Other   Hyperlipidemia    Chronic, ongoing. His cholesterol was elevated last visit, however he states he was not taking his medication regularly. He has been taking it daily for the last several months. Continue Livalo 4 mg daily.  Will check CMP, CBC and lipid panel today.      Relevant Medications   metoprolol tartrate (LOPRESSOR) 50 MG tablet   Other Relevant Orders   CBC   Comprehensive metabolic panel   Lipid panel   Anxiety and  depression    Chronic, stable.  He is doing well on Wellbutrin XL 300 mg daily and sertraline 50 mg daily.  Will continue this regimen.  Follow-up in 6 months.      Relevant Medications   sertraline (ZOLOFT) 50 MG tablet   Other Visit Diagnoses     Right leg pain       No red flags on exam. He does have some point tenderness. Encourage fluids. Can continue ibuprofen prn pain. F/U if not improving.       Return in about 6 months (around 12/30/2022) for CPE.    Gerre Scull, NP

## 2022-06-29 NOTE — Assessment & Plan Note (Signed)
Chronic, stable.  He is doing well on Wellbutrin XL 300 mg daily and sertraline 50 mg daily.  Will continue this regimen.  Follow-up in 6 months. 

## 2022-10-10 ENCOUNTER — Other Ambulatory Visit: Payer: Self-pay | Admitting: Nurse Practitioner

## 2022-10-11 NOTE — Telephone Encounter (Signed)
Requesting: triamterene-hydrochlorothiazide (MAXZIDE-25) 37.5-25 MG tablet  Last Visit: 06/29/2022 Next Visit: 01/02/2023 Last Refill: 04/30/2022  Please Advise

## 2022-12-14 DIAGNOSIS — Z9889 Other specified postprocedural states: Secondary | ICD-10-CM | POA: Diagnosis not present

## 2022-12-14 DIAGNOSIS — H25813 Combined forms of age-related cataract, bilateral: Secondary | ICD-10-CM | POA: Diagnosis not present

## 2023-01-02 ENCOUNTER — Ambulatory Visit: Payer: Medicare Other | Admitting: Nurse Practitioner

## 2023-01-02 ENCOUNTER — Encounter: Payer: Self-pay | Admitting: Nurse Practitioner

## 2023-01-02 ENCOUNTER — Other Ambulatory Visit: Payer: Self-pay | Admitting: Nurse Practitioner

## 2023-01-02 VITALS — BP 154/82 | Temp 97.1°F | Ht 72.0 in | Wt 248.0 lb

## 2023-01-02 DIAGNOSIS — I700 Atherosclerosis of aorta: Secondary | ICD-10-CM

## 2023-01-02 DIAGNOSIS — R7303 Prediabetes: Secondary | ICD-10-CM

## 2023-01-02 DIAGNOSIS — E669 Obesity, unspecified: Secondary | ICD-10-CM

## 2023-01-02 DIAGNOSIS — G8929 Other chronic pain: Secondary | ICD-10-CM

## 2023-01-02 DIAGNOSIS — Z23 Encounter for immunization: Secondary | ICD-10-CM

## 2023-01-02 DIAGNOSIS — M545 Low back pain, unspecified: Secondary | ICD-10-CM

## 2023-01-02 DIAGNOSIS — I10 Essential (primary) hypertension: Secondary | ICD-10-CM | POA: Diagnosis not present

## 2023-01-02 DIAGNOSIS — E7800 Pure hypercholesterolemia, unspecified: Secondary | ICD-10-CM

## 2023-01-02 DIAGNOSIS — S41101A Unspecified open wound of right upper arm, initial encounter: Secondary | ICD-10-CM | POA: Diagnosis not present

## 2023-01-02 DIAGNOSIS — F32A Depression, unspecified: Secondary | ICD-10-CM

## 2023-01-02 DIAGNOSIS — E782 Mixed hyperlipidemia: Secondary | ICD-10-CM

## 2023-01-02 DIAGNOSIS — K2100 Gastro-esophageal reflux disease with esophagitis, without bleeding: Secondary | ICD-10-CM

## 2023-01-02 DIAGNOSIS — Z0001 Encounter for general adult medical examination with abnormal findings: Secondary | ICD-10-CM

## 2023-01-02 DIAGNOSIS — Z0000 Encounter for general adult medical examination without abnormal findings: Secondary | ICD-10-CM | POA: Insufficient documentation

## 2023-01-02 DIAGNOSIS — N401 Enlarged prostate with lower urinary tract symptoms: Secondary | ICD-10-CM

## 2023-01-02 DIAGNOSIS — F419 Anxiety disorder, unspecified: Secondary | ICD-10-CM

## 2023-01-02 DIAGNOSIS — L309 Dermatitis, unspecified: Secondary | ICD-10-CM

## 2023-01-02 LAB — HEMOGLOBIN A1C: Hgb A1c MFr Bld: 6.2 % (ref 4.6–6.5)

## 2023-01-02 LAB — COMPREHENSIVE METABOLIC PANEL
ALT: 23 U/L (ref 0–53)
AST: 19 U/L (ref 0–37)
Albumin: 4.3 g/dL (ref 3.5–5.2)
Alkaline Phosphatase: 72 U/L (ref 39–117)
BUN: 13 mg/dL (ref 6–23)
CO2: 30 meq/L (ref 19–32)
Calcium: 9.5 mg/dL (ref 8.4–10.5)
Chloride: 100 meq/L (ref 96–112)
Creatinine, Ser: 1 mg/dL (ref 0.40–1.50)
GFR: 76.96 mL/min (ref >60.00)
Glucose, Bld: 127 mg/dL — ABNORMAL HIGH (ref 70–99)
Potassium: 3.9 meq/L (ref 3.5–5.1)
Sodium: 138 meq/L (ref 135–145)
Total Bilirubin: 0.8 mg/dL (ref 0.2–1.2)
Total Protein: 7.2 g/dL (ref 6.0–8.3)

## 2023-01-02 LAB — LIPID PANEL
Cholesterol: 199 mg/dL (ref 0–200)
HDL: 41.4 mg/dL (ref >39.00)
LDL Cholesterol: 108 mg/dL — ABNORMAL HIGH (ref 0–99)
NonHDL: 157.39
Total CHOL/HDL Ratio: 5
Triglycerides: 248 mg/dL — ABNORMAL HIGH (ref 0.0–149.0)
VLDL: 49.6 mg/dL — ABNORMAL HIGH (ref 0.0–40.0)

## 2023-01-02 LAB — CBC WITH DIFFERENTIAL/PLATELET
Basophils Absolute: 0.1 10*3/uL (ref 0.0–0.1)
Basophils Relative: 0.9 % (ref 0.0–3.0)
Eosinophils Absolute: 0.3 10*3/uL (ref 0.0–0.7)
Eosinophils Relative: 3.9 % (ref 0.0–5.0)
HCT: 43.6 % (ref 39.0–52.0)
Hemoglobin: 14.8 g/dL (ref 13.0–17.0)
Lymphocytes Relative: 38.4 % (ref 12.0–46.0)
Lymphs Abs: 2.9 10*3/uL (ref 0.7–4.0)
MCHC: 33.9 g/dL (ref 30.0–36.0)
MCV: 96.5 fL (ref 78.0–100.0)
Monocytes Absolute: 0.5 10*3/uL (ref 0.1–1.0)
Monocytes Relative: 6.7 % (ref 3.0–12.0)
Neutro Abs: 3.8 10*3/uL (ref 1.4–7.7)
Neutrophils Relative %: 50.1 % (ref 43.0–77.0)
Platelets: 233 10*3/uL (ref 150.0–400.0)
RBC: 4.52 Mil/uL (ref 4.22–5.81)
RDW: 13.4 % (ref 11.5–15.5)
WBC: 7.5 10*3/uL (ref 4.0–10.5)

## 2023-01-02 MED ORDER — CETIRIZINE HCL 10 MG PO TABS: 10.0000 mg | ORAL_TABLET | Freq: Every day | ORAL | 3 refills | Status: AC

## 2023-01-02 MED ORDER — SERTRALINE HCL 50 MG PO TABS: 50.0000 mg | ORAL_TABLET | Freq: Every day | ORAL | 3 refills | Status: DC

## 2023-01-02 MED ORDER — TRIAMTERENE-HCTZ 37.5-25 MG PO TABS: 1.0000 | ORAL_TABLET | Freq: Every day | ORAL | 3 refills | Status: DC

## 2023-01-02 MED ORDER — TRAMADOL HCL 50 MG PO TABS: 50.0000 mg | ORAL_TABLET | Freq: Two times a day (BID) | ORAL | 0 refills | Status: DC | PRN

## 2023-01-02 MED ORDER — BUPROPION HCL ER (XL) 300 MG PO TB24: 300.0000 mg | ORAL_TABLET | Freq: Every day | ORAL | 3 refills | Status: DC

## 2023-01-02 MED ORDER — OMEPRAZOLE 20 MG PO CPDR: DELAYED_RELEASE_CAPSULE | ORAL | 3 refills | Status: DC

## 2023-01-02 MED ORDER — PITAVASTATIN CALCIUM 4 MG PO TABS: 1.0000 | ORAL_TABLET | Freq: Every day | ORAL | 3 refills | Status: DC

## 2023-01-02 MED ORDER — METOPROLOL TARTRATE 50 MG PO TABS: 50.0000 mg | ORAL_TABLET | Freq: Two times a day (BID) | ORAL | 3 refills | Status: DC

## 2023-01-02 NOTE — Assessment & Plan Note (Signed)
Chronic, stable.  He takes tramadol 50mg  as needed for flare-ups which occur about twice a year. PDMP reviewed. Refill sent to pharmacy. Follow-up if symptoms worsen or any concerns.

## 2023-01-02 NOTE — Addendum Note (Signed)
Addended by: Zada Girt D on: 01/02/2023 01:55 PM   Modules accepted: Orders

## 2023-01-02 NOTE — Assessment & Plan Note (Signed)
Health maintenance reviewed and updated. Discussed nutrition, exercise. Check CMP, CBC today. Follow-up 1 year.   

## 2023-01-02 NOTE — Progress Notes (Signed)
BP (!) 154/82 (BP Location: Right Arm, Cuff Size: Large)   Temp (!) 97.1 F (36.2 C)   Ht 6' (1.829 m)   Wt 248 lb (112.5 kg)   BMI 33.63 kg/m    Subjective:    Patient ID: Antonio Cooper, male    DOB: [birth year], 67 y.o.   MRN: [id number]  CC: Chief Complaint  Patient presents with   Annual Exam    With fasting lab work,Rx Refill, no concerns, update vaccines    HPI: Antonio Cooper is a 67 y.o. male presenting on 01/02/2023 for comprehensive medical examination. Current medical complaints include:none  Depression and Anxiety Screen done today and results listed below:     01/02/2023   10:15 AM 06/29/2022   11:44 AM 04/01/2022    2:12 PM 12/29/2021    1:52 PM 12/29/2021    1:13 PM  Depression screen PHQ 2/9  Decreased Interest 0 1 0 1 0  Down, Depressed, Hopeless 0 1 0 1 0  PHQ - 2 Score 0 2 0 2 0  Altered sleeping 0 0  1   Tired, decreased energy 0 0  1   Change in appetite 0 0  1   Feeling bad or failure about yourself  0 0  1   Trouble concentrating 0 0  0   Moving slowly or fidgety/restless 0 0  0   Suicidal thoughts 0 0     PHQ-9 Score 0 2  6   Difficult doing work/chores Not difficult at all Not difficult at all         01/02/2023   10:15 AM 06/29/2022   11:45 AM 12/29/2021    1:52 PM 08/27/2019   10:16 AM  GAD 7 : Generalized Anxiety Score  Nervous, Anxious, on Edge 0 1 0 1  Control/stop worrying 0 0 0 1  Worry too much - different things 0 0 0 1  Trouble relaxing 0 0 0 0  Restless 0 0 0 1  Easily annoyed or irritable 0 0 0 0  Afraid - awful might happen 0 0 0 0  Total GAD 7 Score 0 1 0 4  Anxiety Difficulty  Not difficult at all  Somewhat difficult    The patient does not have a history of falls. I did not complete a risk assessment for falls. A plan of care for falls was not documented.   Past Medical History:  Past Medical History:  Diagnosis Date   Allergy    Anxiety    BPH (benign prostatic hyperplasia)    Bulging lumbar disc    Cataract     Colon polyps    Depression    GERD (gastroesophageal reflux disease)    Hyperlipidemia    Hypertension    Spinal stenosis     Surgical History:  Past Surgical History:  Procedure Laterality Date   broken nose   1974   COLONOSCOPY     I & D EXTREMITY Left 12/18/2012   Procedure: I&D Left Middle Finger With Repair Recons.Prn;  Surgeon: Dominica Severin, MD;  Location: [facility] OR;  Service: Orthopedics;  Laterality: Left;   I & D EXTREMITY Left 12/20/2012   Procedure: LEFT HAND Incision and drainage  TO INCLUDE MIDDLE FINGER FLEXOR TENDON;  Surgeon: Dominica Severin, MD;  Location: [facility] OR;  Service: Orthopedics;  Laterality: Left;   I & D EXTREMITY Left 12/22/2012   Procedure: IRRIGATION AND DEBRIDEMENT HAND;  Surgeon: Dominica Severin, MD;  Location: [facility] OR;  Service: Orthopedics;  Laterality: Left;   REFRACTIVE SURGERY     TYMPANIC MEMBRANE REPAIR     WISDOM TOOTH EXTRACTION      Medications:  Current Outpatient Medications on File Prior to Visit  Medication Sig   finasteride (PROSCAR) 5 MG tablet Take 5 mg by mouth daily.   Multiple Vitamins-Minerals (MULTIVITAMIN WITH MINERALS) tablet Take by mouth.   [employer] SUNSCREEN SPF 30 EX apply topically to face and body daily for 30   Emollient (CERAVE MOISTURIZING) CREA See admin instructions.   methocarbamol (ROBAXIN) 500 MG tablet Take 1 tablet (500 mg total) by mouth every 8 (eight) hours as needed for muscle spasms. (Patient not taking: Reported on 06/29/2022)   triamcinolone cream (KENALOG) 0.1 % SMARTSIG:1 Application Topical 2-3 Times Daily (Patient not taking: Reported on 01/02/2023)   No current facility-administered medications on file prior to visit.    Allergies:  No Known Allergies  Social History:  Social History   Socioeconomic History   Marital status: Single    Spouse name: Not on file   Number of children: 2   Years of education: Not on file   Highest education level: Not on file  Occupational History   Occupation: CFO   Tobacco Use   Smoking status: Former    Current packs/day: 0.00    Types: Cigarettes    Start date: 44    Quit date: 2000    Years since quitting: 24.9   Smokeless tobacco: Never  Vaping Use   Vaping status: Never Used  Substance and Sexual Activity   Alcohol use: Yes    Alcohol/week: 0.0 standard drinks of alcohol    Comment: social   Drug use: No   Sexual activity: Yes  Other Topics Concern   Not on file  Social History Narrative   Not on file   Social Determinants of Health   Financial Resource Strain: Low Risk  (04/01/2022)   Overall Financial Resource Strain (CARDIA)    Difficulty of Paying Living Expenses: Not hard at all  Food Insecurity: No Food Insecurity (04/01/2022)   Hunger Vital Sign    Worried About Running Out of Food in the Last Year: Never true    Ran Out of Food in the Last Year: Never true  Transportation Needs: No Transportation Needs (04/01/2022)   PRAPARE - Administrator, Civil Service (Medical): No    Lack of Transportation (Non-Medical): No  Physical Activity: Insufficiently Active (04/01/2022)   Exercise Vital Sign    Days of Exercise per Week: 3 days    Minutes of Exercise per Session: 30 min  Stress: Stress Concern Present (04/01/2022)   [employer] of Occupational Health - Occupational Stress Questionnaire    Feeling of Stress : To some extent  Social Connections: Not on file  Intimate Partner Violence: Not At Risk (09/17/2021)   [facility]    Threatened: Not on file    Insulted: Not on file    Physically Hurt : Not on file    Scream: Not on file   Social History   Tobacco Use  Smoking Status Former   Current packs/day: 0.00   Types: Cigarettes   Start date: 30   Quit date: 2000   Years since quitting: 24.9  Smokeless Tobacco Never   Social History   Substance and Sexual Activity  Alcohol Use Yes   Alcohol/week: 0.0 standard drinks of alcohol   Comment: social  Family History:  Family History  Problem Relation Age of Onset   Kidney cancer Mother 91       Deceased   Kidney disease Mother    Hypertension Father 64       Deceased   Neuropathy Father    Breast cancer Sister    Cancer Brother        stomach cancer   Diabetes Brother    Healthy Brother        x1   Healthy Daughter        x2   Kidney cancer Maternal Grandmother    Cancer Other        Aunts & Uncles   Colon cancer Other    Esophageal cancer Neg Hx    Rectal cancer Neg Hx    Stomach cancer Neg Hx     Past medical history, surgical history, medications, allergies, family history and social history reviewed with patient today and changes made to appropriate areas of the chart.   Review of Systems  Constitutional: Negative.   HENT: Negative.    Eyes: Negative.   Respiratory: Negative.    Cardiovascular: Negative.   Gastrointestinal: Negative.   Genitourinary: Negative.   Musculoskeletal: Negative.   Skin: Negative.   Neurological: Negative.   Psychiatric/Behavioral: Negative.     All other ROS negative except what is listed above and in the HPI.      Objective:    BP (!) 154/82 (BP Location: Right Arm, Cuff Size: Large)   Temp (!) 97.1 F (36.2 C)   Ht 6' (1.829 m)   Wt 248 lb (112.5 kg)   BMI 33.63 kg/m   Wt Readings from Last 3 Encounters:  01/02/23 248 lb (112.5 kg)  06/29/22 243 lb (110.2 kg)  04/01/22 235 lb (106.6 kg)    Physical Exam Vitals and nursing note reviewed.  Constitutional:      General: He is not in acute distress.    Appearance: Normal appearance.  HENT:     Head: Normocephalic and atraumatic.     Right Ear: Tympanic membrane, ear canal and external ear normal.     Left Ear: Tympanic membrane, ear canal and external ear normal.  Eyes:     Conjunctiva/sclera: Conjunctivae normal.  Cardiovascular:     Rate and Rhythm: Normal rate and regular rhythm.     Pulses: Normal pulses.     Heart sounds: Normal heart sounds.   Pulmonary:     Effort: Pulmonary effort is normal.     Breath sounds: Normal breath sounds.  Abdominal:     Palpations: Abdomen is soft.     Tenderness: There is no abdominal tenderness.  Musculoskeletal:        General: Normal range of motion.     Cervical back: Normal range of motion and neck supple. No tenderness.     Right lower leg: No edema.     Left lower leg: No edema.  Lymphadenopathy:     Cervical: No cervical adenopathy.  Skin:    General: Skin is warm and dry.     Comments: Wound to right forearm, no signs of infection  Neurological:     General: No focal deficit present.     Mental Status: He is alert and oriented to person, place, and time.     Cranial Nerves: No cranial nerve deficit.     Coordination: Coordination normal.     Gait: Gait normal.  Psychiatric:        Mood and  Affect: Mood normal.        Behavior: Behavior normal.        Thought Content: Thought content normal.        Judgment: Judgment normal.     Results for orders placed or performed in visit on 06/29/22  CBC  Result Value Ref Range   WBC 7.4 4.0 - 10.5 K/uL   RBC 4.52 4.22 - 5.81 Mil/uL   Platelets 225.0 150.0 - 400.0 K/uL   Hemoglobin 14.5 13.0 - 17.0 g/dL   HCT 16.1 09.6 - 04.5 %   MCV 96.1 78.0 - 100.0 fl   MCHC 33.4 30.0 - 36.0 g/dL   RDW 40.9 81.1 - 91.4 %  Comprehensive metabolic panel  Result Value Ref Range   Sodium 139 135 - 145 mEq/L   Potassium 3.7 3.5 - 5.1 mEq/L   Chloride 103 96 - 112 mEq/L   CO2 28 19 - 32 mEq/L   Glucose, Bld 113 (H) 70 - 99 mg/dL   BUN 17 6 - 23 mg/dL   Creatinine, Ser 7.82 0.40 - 1.50 mg/dL   Total Bilirubin 0.6 0.2 - 1.2 mg/dL   Alkaline Phosphatase 68 39 - 117 U/L   AST 18 0 - 37 U/L   ALT 22 0 - 53 U/L   Total Protein 6.8 6.0 - 8.3 g/dL   Albumin 4.3 3.5 - 5.2 g/dL   GFR 95.62 >13.08 mL/min   Calcium 9.0 8.4 - 10.5 mg/dL  Lipid panel  Result Value Ref Range   Cholesterol 195 0 - 200 mg/dL   Triglycerides 657.8 (H) 0.0 - 149.0 mg/dL    HDL 46.96 >29.52 mg/dL   VLDL 84.1 (H) 0.0 - 32.4 mg/dL   Total CHOL/HDL Ratio 4    NonHDL 144.94   LDL cholesterol, direct  Result Value Ref Range   Direct LDL 115.0 mg/dL      Assessment & Plan:   Problem List Items Addressed This Visit       Cardiovascular and Mediastinum   Essential hypertension    Chronic, not controlled. BP elevated today at 154/82. Was controlled in prior visits. Will have him focus on limiting salt and increasing exercise. Continue tramterene-hydrochlorothiazide 37.5-25mg  daily, metoprolol 50mg  BID. May need to add on medication if still elevated next visit. Start checking blood pressure at home. Follow-up in 3 months.       Relevant Medications   metoprolol tartrate (LOPRESSOR) 50 MG tablet   Pitavastatin Calcium (LIVALO) 4 MG TABS   triamterene-hydrochlorothiazide (MAXZIDE-25) 37.5-25 MG tablet   Other Relevant Orders   CBC with Differential/Platelet   Comprehensive metabolic panel   Aortic atherosclerosis (HCC)    Noted on CT scan 05/07/15. Continue livalo 4mg  daily. Check lipid panel today.       Relevant Medications   metoprolol tartrate (LOPRESSOR) 50 MG tablet   Pitavastatin Calcium (LIVALO) 4 MG TABS   triamterene-hydrochlorothiazide (MAXZIDE-25) 37.5-25 MG tablet     Digestive   GERD    Chronic, stable.  Continue omeprazole 20 mg daily.      Relevant Medications   omeprazole (PRILOSEC) 20 MG capsule     Genitourinary   BPH (benign prostatic hyperplasia)    Chronic, stable.  Continue finasteride 5 mg daily.  Will check PSA levels today.      Relevant Orders   PSA     Other   Hyperlipidemia    Chronic, ongoing. Check CMP, CBC, lipid panel today. Continue pitavastatin 4mg  daily. Refill sent to the  pharmacy.       Relevant Medications   metoprolol tartrate (LOPRESSOR) 50 MG tablet   Pitavastatin Calcium (LIVALO) 4 MG TABS   triamterene-hydrochlorothiazide (MAXZIDE-25) 37.5-25 MG tablet   Other Relevant Orders   CBC with  Differential/Platelet   Comprehensive metabolic panel   Lipid panel   Anxiety and depression    Chronic, stable.  He is doing well on Wellbutrin XL 300 mg daily and sertraline 50 mg daily.  Will continue this regimen.       Relevant Medications   buPROPion (WELLBUTRIN XL) 300 MG 24 hr tablet   sertraline (ZOLOFT) 50 MG tablet   Back pain    Chronic, stable.  He takes tramadol 50mg  as needed for flare-ups which occur about twice a year. PDMP reviewed. Refill sent to pharmacy. Follow-up if symptoms worsen or any concerns.       Relevant Medications   traMADol (ULTRAM) 50 MG tablet   Obesity (BMI 30-39.9)    BMI 33.6. Discussed exercise, nutrition.       Routine general medical examination at a health care facility - Primary    Health maintenance reviewed and updated. Discussed nutrition, exercise. Check CMP, CBC today. Follow-up 1 year.        Prediabetes    Chronic, stable. Check A1c today and treat based on results.       Relevant Orders   Hemoglobin A1c   Other Visit Diagnoses     Immunization due       Td and flu vaccine updated today   Relevant Orders   Td vaccine greater than or equal to 7yo preservative free IM   Flu Vaccine Trivalent High Dose (Fluad)   Wound of right upper extremity, initial encounter       Healing well, no signs of infection. Will update Td today.   Relevant Orders   Td vaccine greater than or equal to 7yo preservative free IM   Eczema, unspecified type       Continue zyrtec daily as needed   Relevant Medications   cetirizine (ZYRTEC) 10 MG tablet        LABORATORY TESTING:  Health maintenance labs ordered today as discussed above.   The natural history of prostate cancer and ongoing controversy regarding screening and potential treatment outcomes of prostate cancer has been discussed with the patient. The meaning of a false positive PSA and a false negative PSA has been discussed. He indicates understanding of the limitations of this  screening test and wishes to proceed with screening PSA testing.   IMMUNIZATIONS:   - Tdap: Tetanus vaccination status reviewed: Td vaccination indicated and given today. - Influenza: Administered today - Pneumovax: Up to date - Prevnar: Up to date - HPV: Not applicable - Shingrix vaccine:  Declined  SCREENING: - Colonoscopy: Up to date  Discussed with patient purpose of the colonoscopy is to detect colon cancer at curable precancerous or early stages   - AAA Screening: Not applicable   PATIENT COUNSELING:    Sexuality: Discussed sexually transmitted diseases, partner selection, use of condoms, avoidance of unintended pregnancy  and contraceptive alternatives.   Advised to avoid cigarette smoking.  I discussed with the patient that most people either abstain from alcohol or drink within safe limits (<=14/week and <=4 drinks/occasion for males, <=7/weeks and <= 3 drinks/occasion for females) and that the risk for alcohol disorders and other health effects rises proportionally with the number of drinks per week and how often a drinker exceeds daily limits.  Discussed cessation/primary prevention of drug use and availability of treatment for abuse.   Diet: Encouraged to adjust caloric intake to maintain  or achieve ideal body weight, to reduce intake of dietary saturated fat and total fat, to limit sodium intake by avoiding high sodium foods and not adding table salt, and to maintain adequate dietary potassium and calcium preferably from fresh fruits, vegetables, and low-fat dairy products.    stressed the importance of regular exercise  Injury prevention: Discussed safety belts, safety helmets, smoke detector, smoking near bedding or upholstery.   Dental health: Discussed importance of regular tooth brushing, flossing, and dental visits.   Follow up plan: NEXT PREVENTATIVE PHYSICAL DUE IN 1 YEAR. Return in about 3 months (around 04/02/2023) for HTN.  Maxx Calaway A Mckade Gurka

## 2023-01-02 NOTE — Assessment & Plan Note (Signed)
Chronic, not controlled. BP elevated today at 154/82. Was controlled in prior visits. Will have him focus on limiting salt and increasing exercise. Continue tramterene-hydrochlorothiazide 37.5-25mg  daily, metoprolol 50mg  BID. May need to add on medication if still elevated next visit. Start checking blood pressure at home. Follow-up in 3 months.

## 2023-01-02 NOTE — Assessment & Plan Note (Signed)
Chronic, stable.  Continue omeprazole 20 mg daily. 

## 2023-01-02 NOTE — Assessment & Plan Note (Signed)
Chronic, stable.  Continue finasteride 5 mg daily.  Will check PSA levels today.

## 2023-01-02 NOTE — Assessment & Plan Note (Signed)
Chronic, stable.  He is doing well on Wellbutrin XL 300 mg daily and sertraline 50 mg daily.  Will continue this regimen.

## 2023-01-02 NOTE — Patient Instructions (Signed)
It was great to see you!  We are checking your labs today and will let you know the results via mychart/phone.   Start checking your blood pressure at home about once a week. Try to increase your exercise and limit your salt.   Let's follow-up in 3 months, sooner if you have concerns.  If a referral was placed today, you will be contacted for an appointment. Please note that routine referrals can sometimes take up to 3-4 weeks to process. Please call our office if you haven't heard anything after this time frame.  Take care,  Rodman Pickle, NP

## 2023-01-02 NOTE — Assessment & Plan Note (Signed)
Chronic, stable. Check A1c today and treat based on results.

## 2023-01-02 NOTE — Assessment & Plan Note (Signed)
BMI 33.6. Discussed exercise, nutrition.

## 2023-01-02 NOTE — Assessment & Plan Note (Signed)
Chronic, ongoing. Check CMP, CBC, lipid panel today. Continue pitavastatin 4mg  daily. Refill sent to the pharmacy.

## 2023-01-02 NOTE — Assessment & Plan Note (Signed)
Noted on CT scan 05/07/15. Continue livalo 4mg  daily. Check lipid panel today.

## 2023-01-03 LAB — PSA: Prostate Specific Ag, Serum: 0.1 ng/mL (ref 0.0–4.0)

## 2023-01-04 NOTE — Telephone Encounter (Signed)
Can we get a prior auth on Pitavastatin 4mg ?

## 2023-01-04 NOTE — Telephone Encounter (Signed)
Pharmacy closed for lunch. I will try again after 2pm.

## 2023-01-05 ENCOUNTER — Telehealth: Payer: Self-pay

## 2023-01-05 ENCOUNTER — Other Ambulatory Visit (HOSPITAL_COMMUNITY): Payer: Self-pay

## 2023-01-05 NOTE — Telephone Encounter (Signed)
Pharmacy Patient Advocate Encounter   Received notification from RX Request Messages that prior authorization for Pitavastatin 4mg  is required/requested.   Insurance verification completed.   The patient is insured through [facility] .   Per test claim:  Antonio Cooper is preferred by the insurance. [facility] to see if they could try to bill for the Livalo and the pharmacist stated they received a paid claim but would have to order the medication.

## 2023-01-06 NOTE — Telephone Encounter (Signed)
Noted  

## 2023-04-03 ENCOUNTER — Ambulatory Visit (INDEPENDENT_AMBULATORY_CARE_PROVIDER_SITE_OTHER): Payer: Medicare Other

## 2023-04-03 DIAGNOSIS — Z0000 Encounter for general adult medical examination without abnormal findings: Secondary | ICD-10-CM

## 2023-04-03 NOTE — Progress Notes (Signed)
 Subjective:   Antonio Cooper is a 68 y.o. who presents for a Medicare Wellness preventive visit.  Visit Complete: Virtual I connected with  [name] on 04/03/23 by a audio enabled telemedicine application and verified that I am speaking with the correct person using two identifiers.  Patient Location: Home  Provider Location: Office/Clinic  I discussed the limitations of evaluation and management by telemedicine. The patient expressed understanding and agreed to proceed.  Vital Signs: Because this visit was a virtual/telehealth visit, some criteria may be missing or patient reported. Any vitals not documented were not able to be obtained and vitals that have been documented are patient reported.  VideoError- Librarian, academic were attempted between this provider and patient, however failed, due to patient having technical difficulties OR patient did not have access to video capability.  We continued and completed visit with audio only.   AWV Questionnaire: No: Patient Medicare AWV questionnaire was not completed prior to this visit.  Cardiac Risk Factors include: advanced age (>54men, >26 women);dyslipidemia;hypertension;male gender     Objective:    Today's Vitals   There is no height or weight on file to calculate BMI.     04/03/2023    2:27 PM 04/01/2022    2:10 PM 01/06/2020   10:37 AM 12/22/2012   11:20 AM 12/22/2012    6:26 AM 12/20/2012    2:35 PM 12/19/2012    2:28 AM  Advanced Directives  Does Patient Have a Medical Advance Directive? No No Yes Patient has advance directive, copy not in chart Patient has advance directive, copy not in chart Patient has advance directive, copy not in chart Patient has advance directive, copy not in chart  Type of Advance Directive   Healthcare Power of [place];Living will Healthcare Power of [place];Living will  Healthcare Power of [place];Living will Healthcare Power of [place];Living will  Does  patient want to make changes to medical advance directive?   Yes (ED - Information included in AVS)    Yes  Copy of Healthcare Power of Attorney in Chart?   No - copy requested Copy requested from family  Copy requested from family Copy requested from family  Pre-existing out of facility DNR order (yellow form or pink MOST form)    No  No No    Current Medications (verified) Outpatient Encounter Medications as of 04/03/2023  Medication Sig   buPROPion (WELLBUTRIN XL) 300 MG 24 hr tablet Take 1 tablet (300 mg total) by mouth daily.   cetirizine (ZYRTEC) 10 MG tablet Take 1 tablet (10 mg total) by mouth daily.   [employer] SUNSCREEN SPF 30 EX apply topically to face and body daily for 30   Emollient (CERAVE MOISTURIZING) CREA See admin instructions.   finasteride (PROSCAR) 5 MG tablet Take 5 mg by mouth daily.   metoprolol tartrate (LOPRESSOR) 50 MG tablet Take 1 tablet (50 mg total) by mouth 2 (two) times daily.   Multiple Vitamins-Minerals (MULTIVITAMIN WITH MINERALS) tablet Take by mouth.   omeprazole (PRILOSEC) 20 MG capsule TAKE 1 CAPSULE BY MOUTH EVERY DAY   Pitavastatin Calcium (LIVALO) 4 MG TABS Take 1 tablet (4 mg total) by mouth daily.   sertraline (ZOLOFT) 50 MG tablet Take 1 tablet (50 mg total) by mouth daily.   traMADol (ULTRAM) 50 MG tablet Take 1 tablet (50 mg total) by mouth every 12 (twelve) hours as needed.   triamterene-hydrochlorothiazide (MAXZIDE-25) 37.5-25 MG tablet Take 1 tablet by mouth daily.   methocarbamol (ROBAXIN)  500 MG tablet Take 1 tablet (500 mg total) by mouth every 8 (eight) hours as needed for muscle spasms. (Patient not taking: Reported on 04/03/2023)   triamcinolone cream (KENALOG) 0.1 % SMARTSIG:1 Application Topical 2-3 Times Daily (Patient not taking: Reported on 04/03/2023)   No facility-administered encounter medications on file as of 04/03/2023.    Allergies (verified) Patient has no known allergies.   History: Past Medical History:  Diagnosis Date    Allergy    Anxiety    BPH (benign prostatic hyperplasia)    Bulging lumbar disc    Cataract    Colon polyps    Depression    GERD (gastroesophageal reflux disease)    Hyperlipidemia    Hypertension    Spinal stenosis    Past Surgical History:  Procedure Laterality Date   broken nose   1974   COLONOSCOPY     I & D EXTREMITY Left 12/18/2012   Procedure: I&D Left Middle Finger With Repair Recons.Prn;  Surgeon: Dominica Severin, MD;  Location: [facility] OR;  Service: Orthopedics;  Laterality: Left;   I & D EXTREMITY Left 12/20/2012   Procedure: LEFT HAND Incision and drainage  TO INCLUDE MIDDLE FINGER FLEXOR TENDON;  Surgeon: Dominica Severin, MD;  Location: [facility] OR;  Service: Orthopedics;  Laterality: Left;   I & D EXTREMITY Left 12/22/2012   Procedure: IRRIGATION AND DEBRIDEMENT HAND;  Surgeon: Dominica Severin, MD;  Location: [facility] OR;  Service: Orthopedics;  Laterality: Left;   REFRACTIVE SURGERY     TYMPANIC MEMBRANE REPAIR     WISDOM TOOTH EXTRACTION     Family History  Problem Relation Age of Onset   Kidney cancer Mother 6       Deceased   Kidney disease Mother    Hypertension Father 56       Deceased   Neuropathy Father    Breast cancer Sister    Cancer Brother        stomach cancer   Diabetes Brother    Healthy Brother        x1   Healthy Daughter        x2   Kidney cancer Maternal Grandmother    Cancer Other        Aunts & Uncles   Colon cancer Other    Esophageal cancer Neg Hx    Rectal cancer Neg Hx    Stomach cancer Neg Hx    Social History   Socioeconomic History   Marital status: Single    Spouse name: Not on file   Number of children: 2   Years of education: Not on file   Highest education level: Not on file  Occupational History   Occupation: CFO  Tobacco Use   Smoking status: Former    Current packs/day: 0.00    Types: Cigarettes    Start date: 1990    Quit date: 2000    Years since quitting: 25.1   Smokeless tobacco: Never  Vaping Use   Vaping  status: Never Used  Substance and Sexual Activity   Alcohol use: Not Currently    Comment: social   Drug use: No   Sexual activity: Yes  Other Topics Concern   Not on file  Social History Narrative   Not on file   Social Drivers of Health   Financial Resource Strain: Low Risk  (04/03/2023)   Overall Financial Resource Strain (CARDIA)    Difficulty of Paying Living Expenses: Not hard at all  Food Insecurity: No Food  Insecurity (04/03/2023)   Hunger Vital Sign    Worried About Running Out of Food in the Last Year: Never true    Ran Out of Food in the Last Year: Never true  Transportation Needs: No Transportation Needs (04/03/2023)   PRAPARE - Administrator, Civil Service (Medical): No    Lack of Transportation (Non-Medical): No  Physical Activity: Sufficiently Active (04/03/2023)   Exercise Vital Sign    Days of Exercise per Week: 7 days    Minutes of Exercise per Session: 30 min  Stress: No Stress Concern Present (04/03/2023)   [employer] of Occupational Health - Occupational Stress Questionnaire    Feeling of Stress : Only a little  Social Connections: Unknown (04/03/2023)   Social Connection and Isolation Panel [NHANES]    Frequency of Communication with Friends and Family: Not on file    Frequency of Social Gatherings with Friends and Family: Three times a week    Attends Religious Services: Never    Active Member of Clubs or Organizations: No    Attends Engineer, structural: Never    Marital Status: Patient declined    Tobacco Counseling Counseling given: Not Answered    Clinical Intake:  Pre-visit preparation completed: Yes  Pain : No/denies pain     Nutritional Risks: None Diabetes: No  How often do you need to have someone help you when you read instructions, pamphlets, or other written materials from your doctor or pharmacy?: 1 - Never  Interpreter Needed?: No  Information entered by :: NAllen LPN   Activities of Daily Living      04/03/2023    2:18 PM  In your present state of health, do you have any difficulty performing the following activities:  Hearing? 1  Comment has hearing aids, but does not wear them  Vision? 0  Difficulty concentrating or making decisions? 0  Walking or climbing stairs? 0  Dressing or bathing? 0  Doing errands, shopping? 0  Preparing Food and eating ? N  Using the Toilet? N  In the past six months, have you accidently leaked urine? N  Do you have problems with loss of bowel control? N  Managing your Medications? N  Managing your Finances? N  Housekeeping or managing your Housekeeping? N    Patient Care Team: Gerre Scull, NP as PCP - General (Internal Medicine) Dahlia Byes, [facility] as Pharmacist (Pharmacist)  Indicate any recent Medical Services you may have received from other than [facility] providers in the past year (date may be approximate).     Assessment:   This is a routine wellness examination for [place].  Hearing/Vision screen Hearing Screening - Comments:: Has hearing aids. But does not wear them Vision Screening - Comments:: Regular eye exams, [facility]   Goals Addressed             This Visit's Progress    Patient Stated       04/03/2023, wants to get back in to the gym       Depression Screen     04/03/2023    2:31 PM 01/02/2023   10:15 AM 06/29/2022   11:44 AM 04/01/2022    2:12 PM 12/29/2021    1:52 PM 12/29/2021    1:13 PM 09/10/2021    9:20 AM  PHQ 2/9 Scores  PHQ - 2 Score 0 0 2 0 2 0 0  PHQ- 9 Score 0 0 2  6  2     Fall Risk  04/03/2023    2:29 PM 01/02/2023   10:15 AM 06/29/2022   11:18 AM 04/01/2022    2:12 PM 03/31/2022    7:06 PM  Fall Risk   Falls in the past year? 1 0 0 0 0  Comment missed a step      Number falls in past yr: 0 0 0 0   Injury with Fall? 0 0 0 0   Risk for fall due to : Medication side effect No Fall Risks No Fall Risks Medication side effect   Follow up Falls prevention discussed;Falls evaluation completed Falls  evaluation completed Falls evaluation completed Falls prevention discussed;Education provided;Falls evaluation completed     MEDICARE RISK AT HOME:  Medicare Risk at Home Any stairs in or around the home?: Yes If so, are there any without handrails?: No Home free of loose throw rugs in walkways, pet beds, electrical cords, etc?: Yes Adequate lighting in your home to reduce risk of falls?: Yes Life alert?: No Use of a cane, walker or w/c?: No Grab bars in the bathroom?: Yes Shower chair or bench in shower?: No Elevated toilet seat or a handicapped toilet?: Yes  TIMED UP AND GO:  Was the test performed?  No  Cognitive Function: 6CIT completed        04/03/2023    2:32 PM 04/01/2022    2:15 PM  6CIT Screen  What Year? 0 points 0 points  What month? 0 points 0 points  What time? 0 points 0 points  Count back from 20 0 points 0 points  Months in reverse 0 points 0 points  Repeat phrase 0 points 0 points  Total Score 0 points 0 points    Immunizations Immunization History  Administered Date(s) Administered   Fluad Quad(high Dose 65+) 11/09/2018, 01/06/2020, 10/09/2020   Fluad Trivalent(High Dose 65+) 01/02/2023   Influenza Whole 01/31/2005, 01/04/2007, 02/11/2009   Influenza,inj,Quad PF,6+ Mos 09/27/2013, 10/13/2014, 10/30/2015, 10/31/2016, 11/06/2017   Influenza-Unspecified 10/13/2021   PFIZER(Purple Top)SARS-COV-2 Vaccination 03/11/2019, 04/15/2019, 01/20/2020   Pneumococcal Conjugate-13 11/09/2018   Pneumococcal Polysaccharide-23 01/06/2020   Td 01/31/2005, 01/02/2023   Tdap 12/18/2012   Zoster Recombinant(Shingrix) 10/09/2020, 10/13/2021    Screening Tests Health Maintenance  Topic Date Due   Medicare Annual Wellness (AWV)  04/02/2024   Colonoscopy  04/23/2029   DTaP/Tdap/Td (4 - Td or Tdap) 01/01/2033   Pneumonia Vaccine 35+ Years old  Completed   INFLUENZA VACCINE  Completed   Hepatitis C Screening  Completed   HPV VACCINES  Aged Out   COVID-19 Vaccine   Discontinued   Zoster Vaccines- Shingrix  Discontinued    Health Maintenance  There are no preventive care reminders to display for this patient. Health Maintenance Items Addressed: Up to date   Additional Screening:  Vision Screening: Recommended annual ophthalmology exams for early detection of glaucoma and other disorders of the eye.  Dental Screening: Recommended annual dental exams for proper oral hygiene  Community Resource Referral / Chronic Care Management: CRR required this visit?  No   CCM required this visit?  No     Plan:     I have personally reviewed and noted the following in the patient's chart:   Medical and social history Use of alcohol, tobacco or illicit drugs  Current medications and supplements including opioid prescriptions. Patient is not currently taking opioid prescriptions. Functional ability and status Nutritional status Physical activity Advanced directives List of other physicians Hospitalizations, surgeries, and ER visits in previous 12 months Vitals Screenings to  include cognitive, depression, and falls Referrals and appointments  In addition, I have reviewed and discussed with patient certain preventive protocols, quality metrics, and best practice recommendations. A written personalized care plan for preventive services as well as general preventive health recommendations were provided to patient.     Barb Merino, LPN   02/05/1094   After Visit Summary: (MyChart) Due to this being a telephonic visit, the after visit summary with patients personalized plan was offered to patient via MyChart   Notes: Nothing significant to report at this time.

## 2023-04-03 NOTE — Patient Instructions (Signed)
 Mr. Karaffa , Thank you for taking time to come for your Medicare Wellness Visit. I appreciate your ongoing commitment to your health goals. Please review the following plan we discussed and let me know if I can assist you in the future.   Referrals/Orders/Follow-Ups/Clinician Recommendations: none  This is a list of the screening recommended for you and due dates:  Health Maintenance  Topic Date Due   Medicare Annual Wellness Visit  04/02/2024   Colon Cancer Screening  04/23/2029   DTaP/Tdap/Td vaccine (4 - Td or Tdap) 01/01/2033   Pneumonia Vaccine  Completed   Flu Shot  Completed   Hepatitis C Screening  Completed   HPV Vaccine  Aged Out   COVID-19 Vaccine  Discontinued   Zoster (Shingles) Vaccine  Discontinued    Advanced directives: (ACP Link)Information on Advanced Care Planning can be found at [facility] Advance Health Care Directives Advance Health Care Directives ([email or web address]   Next Medicare Annual Wellness Visit scheduled for next year: Yes  insert Preventive Care attachment Insert FALL PREVENTION attachment if needed

## 2023-05-15 ENCOUNTER — Encounter: Payer: Self-pay | Admitting: Nurse Practitioner

## 2023-05-15 ENCOUNTER — Ambulatory Visit (INDEPENDENT_AMBULATORY_CARE_PROVIDER_SITE_OTHER): Admitting: Nurse Practitioner

## 2023-05-15 ENCOUNTER — Other Ambulatory Visit: Payer: Self-pay | Admitting: Nurse Practitioner

## 2023-05-15 VITALS — BP 136/72 | HR 77 | Temp 97.3°F | Ht 72.0 in | Wt 247.0 lb

## 2023-05-15 DIAGNOSIS — E782 Mixed hyperlipidemia: Secondary | ICD-10-CM

## 2023-05-15 DIAGNOSIS — R202 Paresthesia of skin: Secondary | ICD-10-CM | POA: Insufficient documentation

## 2023-05-15 DIAGNOSIS — E7800 Pure hypercholesterolemia, unspecified: Secondary | ICD-10-CM

## 2023-05-15 MED ORDER — SIMVASTATIN 40 MG PO TABS: 40.0000 mg | ORAL_TABLET | Freq: Every day | ORAL | 0 refills | Status: DC

## 2023-05-15 MED ORDER — GABAPENTIN 300 MG PO CAPS: 300.0000 mg | ORAL_CAPSULE | Freq: Every day | ORAL | 0 refills | Status: DC

## 2023-05-15 NOTE — Assessment & Plan Note (Signed)
 Chronic, not controlled. He experiences worsening tingling and numbness in his hands and feet, suggesting peripheral neuropathy. He notes that he had the neuropathy in his feet for the last 8 years and had negative EMG test. The differential diagnosis includes diabetic neuropathy and vitamin B12 deficiency. Check BMP, A1c, and vitamin B12. Start gabapentin 300mg  at bedtime.  Discussed possible side effects. Follow-up at next visit in 6 weeks.

## 2023-05-15 NOTE — Assessment & Plan Note (Signed)
 His hyperlipidemia, previously managed with Livalo, will now be treated with simvastatin due to cost considerations. Switch to simvastatin (Zocor) 40mg  daily and check cholesterol levels at the next visit, ensuring fasting.

## 2023-05-15 NOTE — Telephone Encounter (Signed)
 I called and spoke with patient and he could come in today at 420pm today to be seen. Appointment made.

## 2023-05-15 NOTE — Patient Instructions (Signed)
 It was great to see you!  We are checking your labs today and will let you know the results via mychart/phone.   Let's switch your livalo to simvastatin (zocor) daily   Start gabapentin 1 capsule at bedtime   Let's follow-up at your next scheduled visit   Take care,  Rheba Cedar, NP

## 2023-05-15 NOTE — Progress Notes (Signed)
 Established Patient Office Visit  Subjective   Patient ID: Antonio Cooper, male    DOB: 1955/12/31  Age: 68 y.o. MRN: [id number]  Chief Complaint  Patient presents with   Neuropathy    In feet    HPI  Discussed the use of AI scribe software for clinical note transcription with the patient, who gave verbal consent to proceed.  History of Present Illness   The patient, with a history of a herniated disc, presents with complaints of tingling in the hands and feet. The patient describes the sensation as an odd feeling of swelling and bursting, which is particularly noticeable at night and has been disturbing his sleep. The patient has been managing the pain with hydrocodone, a medication initially prescribed for a back injury.  The patient also reports an increase in the frequency of urination, which has been particularly noticeable at night. The patient has been off Flomax, a medication previously used for urinary symptoms, and is considering whether this might be contributing to the current symptoms.  The patient has a family history of diabetes, with an older brother diagnosed five years ago and a father who experienced similar symptoms of burning feet before his passing. The patient is concerned about the possibility of having diabetes, especially given that recent blood work has shown elevated sugar levels.  The patient also mentions a recent increase in physical activity, walking up to two miles a day, occasionally three, and maintaining a stable weight. However, the patient acknowledges the need for a better diet, particularly since he has been eating less healthily since he stopped cooking regularly.  The patient also mentions a concern about a medication, Livalo, which his insurance has stopped covering due to its high cost. The patient is open to switching to a different medication for cholesterol management.        ROS See pertinent positives and negatives per HPI.     Objective:     BP 136/72 (BP Location: Left Arm, Patient Position: Sitting, Cuff Size: Large)   Pulse 77   Temp (!) 97.3 F (36.3 C)   Ht 6' (1.829 m)   Wt 247 lb (112 kg)   SpO2 98%   BMI 33.50 kg/m  BP Readings from Last 3 Encounters:  05/15/23 136/72  01/02/23 (!) 154/82  06/29/22 136/84   Wt Readings from Last 3 Encounters:  05/15/23 247 lb (112 kg)  01/02/23 248 lb (112.5 kg)  06/29/22 243 lb (110.2 kg)      Physical Exam Vitals and nursing note reviewed.  Constitutional:      Appearance: Normal appearance.  HENT:     Head: Normocephalic.  Eyes:     Conjunctiva/sclera: Conjunctivae normal.  Cardiovascular:     Rate and Rhythm: Normal rate and regular rhythm.     Pulses: Normal pulses.     Heart sounds: Normal heart sounds.  Pulmonary:     Effort: Pulmonary effort is normal.     Breath sounds: Normal breath sounds.  Musculoskeletal:        General: No swelling or tenderness.     Cervical back: Normal range of motion.  Skin:    General: Skin is warm.  Neurological:     General: No focal deficit present.     Mental Status: He is alert and oriented to person, place, and time.  Psychiatric:        Mood and Affect: Mood normal.        Behavior: Behavior normal.  Thought Content: Thought content normal.        Judgment: Judgment normal.    The 10-year ASCVD risk score (Arnett DK, et al., 2019) is: 23.3%    Assessment & Plan:   Problem List Items Addressed This Visit       Other   Hyperlipidemia   His hyperlipidemia, previously managed with Livalo, will now be treated with simvastatin due to cost considerations. Switch to simvastatin (Zocor) 40mg  daily and check cholesterol levels at the next visit, ensuring fasting.       Relevant Medications   simvastatin (ZOCOR) 40 MG tablet   Tingling in extremities - Primary   Chronic, not controlled. He experiences worsening tingling and numbness in his hands and feet, suggesting peripheral neuropathy.  He notes that he had the neuropathy in his feet for the last 8 years and had negative EMG test. The differential diagnosis includes diabetic neuropathy and vitamin B12 deficiency. Check BMP, A1c, and vitamin B12. Start gabapentin 300mg  at bedtime.  Discussed possible side effects. Follow-up at next visit in 6 weeks.       Relevant Orders   Hemoglobin A1c   Vitamin B12   Basic metabolic panel with GFR    Return if symptoms worsen or fail to improve.    Odette Benjamin, NP

## 2023-05-16 ENCOUNTER — Encounter: Payer: Self-pay | Admitting: Nurse Practitioner

## 2023-05-16 LAB — BASIC METABOLIC PANEL WITH GFR
BUN: 11 mg/dL (ref 6–23)
CO2: 29 meq/L (ref 19–32)
Calcium: 9.2 mg/dL (ref 8.4–10.5)
Chloride: 101 meq/L (ref 96–112)
Creatinine, Ser: 0.98 mg/dL (ref 0.40–1.50)
GFR: 78.65 mL/min (ref >60.00)
Glucose, Bld: 131 mg/dL — ABNORMAL HIGH (ref 70–99)
Potassium: 3.7 meq/L (ref 3.5–5.1)
Sodium: 138 meq/L (ref 135–145)

## 2023-05-16 LAB — VITAMIN B12: Vitamin B-12: 568 pg/mL (ref 211–911)

## 2023-05-16 LAB — HEMOGLOBIN A1C: Hgb A1c MFr Bld: 6.2 % (ref 4.6–6.5)

## 2023-07-04 ENCOUNTER — Encounter: Payer: Self-pay | Admitting: Nurse Practitioner

## 2023-07-04 ENCOUNTER — Other Ambulatory Visit: Payer: Self-pay | Admitting: Nurse Practitioner

## 2023-07-04 NOTE — Telephone Encounter (Signed)
 Requesting: TRAMADOL  HCL 50 MG TABLET  Last Visit: 05/15/2023 Next Visit: 07/05/2023 Last Refill: 01/02/2023  Please Advise

## 2023-07-05 ENCOUNTER — Encounter: Payer: Self-pay | Admitting: Nurse Practitioner

## 2023-07-05 ENCOUNTER — Ambulatory Visit: Admitting: Nurse Practitioner

## 2023-07-05 VITALS — BP 128/70 | HR 80 | Temp 96.4°F | Ht 72.0 in | Wt 247.0 lb

## 2023-07-05 DIAGNOSIS — M542 Cervicalgia: Secondary | ICD-10-CM

## 2023-07-05 DIAGNOSIS — N401 Enlarged prostate with lower urinary tract symptoms: Secondary | ICD-10-CM | POA: Diagnosis not present

## 2023-07-05 DIAGNOSIS — G8929 Other chronic pain: Secondary | ICD-10-CM

## 2023-07-05 DIAGNOSIS — I10 Essential (primary) hypertension: Secondary | ICD-10-CM

## 2023-07-05 MED ORDER — TAMSULOSIN HCL 0.4 MG PO CAPS: 0.4000 mg | ORAL_CAPSULE | Freq: Every day | ORAL | 1 refills | Status: AC

## 2023-07-05 MED ORDER — PREGABALIN 75 MG PO CAPS: 75.0000 mg | ORAL_CAPSULE | Freq: Every day | ORAL | 0 refills | Status: DC

## 2023-07-05 NOTE — Assessment & Plan Note (Signed)
 He experiences occasional urinary issues due to BPH. Flomax  was previously effective. Restart Flomax  0.4mg  once daily in the evening in addition to proscar 5mg  daily and follow up with urology.

## 2023-07-05 NOTE — Assessment & Plan Note (Signed)
 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 Gabapentin  has been partially effective, and skin peeling may be related to medication. Discontinue gabapentin  and start Lyrica (pregabalin) 75mg  once daily. Order a cervical spine x-ray and refer to neurologist Dr. Ellery Guthrie who he has seen in the past. Provide neck exercises.

## 2023-07-05 NOTE — Progress Notes (Signed)
 Established Patient Office Visit  Subjective   Patient ID: Antonio Cooper, male    DOB: 09/18/1955  Age: 68 y.o. MRN: [id number]  Chief Complaint  Patient presents with   Medication Management    HPI Discussed the use of AI scribe software for clinical note transcription with the patient, who gave verbal consent to proceed.  History of Present Illness   Antonio ETCHISON "Chip" is a 68 year old male with hereditary neuropathy who presents with worsening numbness and dryness in his hands.  He experiences worsening numbness and dryness in his hands, with skin peeling and redness, particularly in the morning. The numbness leads to difficulty holding objects. These symptoms have been present for a few months. He takes gabapentin , which provides limited relief. His father also had neuropathy, but without hand symptoms. He experiences occasional neck pain, especially when looking up. He is increasing his physical activity by walking more.    He also notes that his BPH has worsened. He is taking proscar but feels like he needs to restart his flomax . His urologist had to reschedule his last visit.        ROS See pertinent positives and negatives per HPI.    Objective:     BP 128/70 (BP Location: Left Arm, Patient Position: Sitting, Cuff Size: Normal)   Pulse 80   Temp (!) 96.4 F (35.8 C)   Ht 6' (1.829 m)   Wt 247 lb (112 kg)   SpO2 100%   BMI 33.50 kg/m    Physical Exam Vitals and nursing note reviewed.  Constitutional:      Appearance: Normal appearance.  HENT:     Head: Normocephalic.  Eyes:     Conjunctiva/sclera: Conjunctivae normal.  Cardiovascular:     Rate and Rhythm: Normal rate and regular rhythm.     Pulses: Normal pulses.     Heart sounds: Normal heart sounds.  Pulmonary:     Effort: Pulmonary effort is normal.     Breath sounds: Normal breath sounds.  Musculoskeletal:        General: No tenderness. Normal range of motion.     Cervical back: Normal  range of motion.  Skin:    General: Skin is warm.  Neurological:     General: No focal deficit present.     Mental Status: He is alert and oriented to person, place, and time.  Psychiatric:        Mood and Affect: Mood normal.        Behavior: Behavior normal.        Thought Content: Thought content normal.        Judgment: Judgment normal.    The 10-year ASCVD risk score (Arnett DK, et al., 2019) is: 21.2%    Assessment & Plan:   Problem List Items Addressed This Visit       Cardiovascular and Mediastinum   Essential hypertension   Chronic, stable.Continue tramterene-hydrochlorothiazide  37.5-25mg  daily, metoprolol  50mg  BID. Follow-up in 6 months.         Genitourinary   BPH (benign prostatic hyperplasia)   He experiences occasional urinary issues due to BPH. Flomax  was previously effective. Restart Flomax  0.4mg  once daily in the evening in addition to proscar 5mg  daily and follow up with urology.       Relevant Medications   tamsulosin  (FLOMAX ) 0.4 MG CAPS capsule     Other   Chronic neck pain - Primary   Chronic neuropathy is exacerbated in his hands, with possible  cervical disc involvement. Gabapentin  has been partially effective, and skin peeling may be related to medication. Discontinue gabapentin  and start Lyrica (pregabalin) 75mg  once daily. Order a cervical spine x-ray and refer to neurologist Dr. Ellery Guthrie who he has seen in the past. Provide neck exercises.      Relevant Medications   pregabalin (LYRICA) 75 MG capsule   Other Relevant Orders   DG Cervical Spine Complete   Ambulatory referral to Neurosurgery   Return in about 3 months (around 10/05/2023) for 2-3 months tingling in arms.    Odette Benjamin, NP

## 2023-07-05 NOTE — Patient Instructions (Signed)
 It was great to see you!  I have ordered an xray of your neck. You can walk in without an appointment from Monday -Friday 8am-5pm.   I placed a referral to Dr. Ellery Guthrie   Stop the gabapentin  and start lyrica 1 capsule daily   I have attached some exercises for your neck to do daily   Start the flomax  once a day in the evening and follow-up with urology   Let's follow-up in 2-3 months, sooner if you have concerns.  If a referral was placed today, you will be contacted for an appointment. Please note that routine referrals can sometimes take up to 3-4 weeks to process. Please call our office if you haven't heard anything after this time frame.  Take care,  Rheba Cedar, NP

## 2023-07-05 NOTE — Assessment & Plan Note (Signed)
 Chronic, stable.Continue tramterene-hydrochlorothiazide  37.5-25mg  daily, metoprolol  50mg  BID. Follow-up in 6 months.

## 2023-08-07 ENCOUNTER — Other Ambulatory Visit: Payer: Self-pay | Admitting: Nurse Practitioner

## 2023-08-09 NOTE — Telephone Encounter (Signed)
 Requesting: TRAMADOL  HCL 50 MG TABLET , SIMVASTATIN  40 MG TABLET  Last Visit: 07/05/2023 Next Visit: 10/05/2023 Last Refill: 07/05/2023, 05/15/2023  Please Advise

## 2023-08-23 ENCOUNTER — Ambulatory Visit (INDEPENDENT_AMBULATORY_CARE_PROVIDER_SITE_OTHER)

## 2023-08-23 DIAGNOSIS — M4802 Spinal stenosis, cervical region: Secondary | ICD-10-CM | POA: Diagnosis not present

## 2023-08-23 DIAGNOSIS — G8929 Other chronic pain: Secondary | ICD-10-CM | POA: Diagnosis not present

## 2023-08-23 DIAGNOSIS — M5412 Radiculopathy, cervical region: Secondary | ICD-10-CM | POA: Diagnosis not present

## 2023-08-23 DIAGNOSIS — M542 Cervicalgia: Secondary | ICD-10-CM | POA: Diagnosis not present

## 2023-08-31 ENCOUNTER — Ambulatory Visit: Payer: Self-pay | Admitting: Nurse Practitioner

## 2023-09-06 DIAGNOSIS — M5412 Radiculopathy, cervical region: Secondary | ICD-10-CM | POA: Diagnosis not present

## 2023-09-06 DIAGNOSIS — G5603 Carpal tunnel syndrome, bilateral upper limbs: Secondary | ICD-10-CM | POA: Diagnosis not present

## 2023-09-07 ENCOUNTER — Other Ambulatory Visit: Payer: Self-pay | Admitting: Neurological Surgery

## 2023-09-07 DIAGNOSIS — M5412 Radiculopathy, cervical region: Secondary | ICD-10-CM

## 2023-09-20 ENCOUNTER — Ambulatory Visit
Admission: RE | Admit: 2023-09-20 | Discharge: 2023-09-20 | Disposition: A | Discharge status: Home / Self Care | Source: Ambulatory Visit | Attending: Neurological Surgery | Admitting: Neurological Surgery

## 2023-09-20 DIAGNOSIS — M5116 Intervertebral disc disorders with radiculopathy, lumbar region: Secondary | ICD-10-CM | POA: Diagnosis not present

## 2023-09-20 DIAGNOSIS — M5412 Radiculopathy, cervical region: Secondary | ICD-10-CM

## 2023-09-20 DIAGNOSIS — M4802 Spinal stenosis, cervical region: Secondary | ICD-10-CM | POA: Diagnosis not present

## 2023-09-30 ENCOUNTER — Other Ambulatory Visit: Payer: Self-pay | Admitting: Nurse Practitioner

## 2023-10-03 DIAGNOSIS — R350 Frequency of micturition: Secondary | ICD-10-CM | POA: Diagnosis not present

## 2023-10-04 DIAGNOSIS — G959 Disease of spinal cord, unspecified: Secondary | ICD-10-CM | POA: Diagnosis not present

## 2023-10-05 ENCOUNTER — Encounter: Payer: Self-pay | Admitting: Nurse Practitioner

## 2023-10-05 ENCOUNTER — Ambulatory Visit: Payer: Self-pay | Admitting: Nurse Practitioner

## 2023-10-05 ENCOUNTER — Ambulatory Visit: Admitting: Nurse Practitioner

## 2023-10-05 VITALS — BP 144/82 | HR 68 | Temp 97.2°F | Ht 72.0 in | Wt 246.4 lb

## 2023-10-05 DIAGNOSIS — Z23 Encounter for immunization: Secondary | ICD-10-CM

## 2023-10-05 DIAGNOSIS — R7303 Prediabetes: Secondary | ICD-10-CM

## 2023-10-05 DIAGNOSIS — G8929 Other chronic pain: Secondary | ICD-10-CM | POA: Diagnosis not present

## 2023-10-05 DIAGNOSIS — E782 Mixed hyperlipidemia: Secondary | ICD-10-CM | POA: Diagnosis not present

## 2023-10-05 DIAGNOSIS — M542 Cervicalgia: Secondary | ICD-10-CM | POA: Diagnosis not present

## 2023-10-05 DIAGNOSIS — I10 Essential (primary) hypertension: Secondary | ICD-10-CM | POA: Diagnosis not present

## 2023-10-05 LAB — COMPREHENSIVE METABOLIC PANEL WITH GFR
ALT: 22 U/L (ref 0–53)
AST: 18 U/L (ref 0–37)
Albumin: 4.2 g/dL (ref 3.5–5.2)
Alkaline Phosphatase: 76 U/L (ref 39–117)
BUN: 12 mg/dL (ref 6–23)
CO2: 30 meq/L (ref 19–32)
Calcium: 9.3 mg/dL (ref 8.4–10.5)
Chloride: 99 meq/L (ref 96–112)
Creatinine, Ser: 0.95 mg/dL (ref 0.40–1.50)
GFR: 81.42 mL/min (ref >60.00)
Glucose, Bld: 108 mg/dL — ABNORMAL HIGH (ref 70–99)
Potassium: 3.5 meq/L (ref 3.5–5.1)
Sodium: 140 meq/L (ref 135–145)
Total Bilirubin: 0.6 mg/dL (ref 0.2–1.2)
Total Protein: 7.1 g/dL (ref 6.0–8.3)

## 2023-10-05 LAB — LIPID PANEL
Cholesterol: 178 mg/dL (ref 0–200)
HDL: 40.1 mg/dL (ref >39.00)
LDL Cholesterol: 82 mg/dL (ref 0–99)
NonHDL: 137.69
Total CHOL/HDL Ratio: 4
Triglycerides: 277 mg/dL — ABNORMAL HIGH (ref 0.0–149.0)
VLDL: 55.4 mg/dL — ABNORMAL HIGH (ref 0.0–40.0)

## 2023-10-05 LAB — CBC WITH DIFFERENTIAL/PLATELET
Basophils Absolute: 0 K/uL (ref 0.0–0.1)
Basophils Relative: 0.7 % (ref 0.0–3.0)
Eosinophils Absolute: 0.4 K/uL (ref 0.0–0.7)
Eosinophils Relative: 5.9 % — ABNORMAL HIGH (ref 0.0–5.0)
HCT: 42.6 % (ref 39.0–52.0)
Hemoglobin: 14.7 g/dL (ref 13.0–17.0)
Lymphocytes Relative: 40.9 % (ref 12.0–46.0)
Lymphs Abs: 2.9 K/uL (ref 0.7–4.0)
MCHC: 34.5 g/dL (ref 30.0–36.0)
MCV: 93.3 fl (ref 78.0–100.0)
Monocytes Absolute: 0.5 K/uL (ref 0.1–1.0)
Monocytes Relative: 6.9 % (ref 3.0–12.0)
Neutro Abs: 3.3 K/uL (ref 1.4–7.7)
Neutrophils Relative %: 45.6 % (ref 43.0–77.0)
Platelets: 216 K/uL (ref 150.0–400.0)
RBC: 4.57 Mil/uL (ref 4.22–5.81)
RDW: 13.4 % (ref 11.5–15.5)
WBC: 7.2 K/uL (ref 4.0–10.5)

## 2023-10-05 LAB — HEMOGLOBIN A1C: Hgb A1c MFr Bld: 6.6 % — ABNORMAL HIGH (ref 4.6–6.5)

## 2023-10-05 NOTE — Assessment & Plan Note (Signed)
 Chronic, stable. Continue simvastatin  40mg  daily. Check CMP, CBC, lipid panel today and treat based on results.

## 2023-10-05 NOTE — Progress Notes (Signed)
 Established Patient Office Visit  Subjective   Patient ID: Antonio Cooper, male    DOB: December 09, 1955  Age: 68 y.o. MRN: [id number]  Chief Complaint  Patient presents with   Chronic neck pain    Follow up and discuss tingling in arms and hands    HPI  Discussed the use of AI scribe software for clinical note transcription with the patient, who gave verbal consent to proceed.  History of Present Illness   Antonio Cooper Chip is a 68 year old male with cervical disc degeneration and spinal stenosis who presents with neck pain and numbness.  He experiences significant neck pain, numbness, and tingling in his hands and arms, with possible involvement of his feet. These symptoms have intensified over the last couple of years. An MRI shows degeneration at the C4-C5 level with a large disc extrusion and severe spinal stenosis.  He takes tramadol  and Lyrica , which are more effective than gabapentin  for symptom management. He remains active, walking about three miles daily, which he finds beneficial. He is following with neurosurgery who recommends surgery and he is most likely going to schedule this soon.       ROS See pertinent positives and negatives per HPI.    Objective:     BP (!) 144/82 (BP Location: Left Arm, Cuff Size: Large)   Pulse 68   Temp (!) 97.2 F (36.2 C)   Ht 6' (1.829 m)   Wt 246 lb 6.4 oz (111.8 kg)   SpO2 97%   BMI 33.42 kg/m  BP Readings from Last 3 Encounters:  10/05/23 (!) 144/82  07/05/23 128/70  05/15/23 136/72   Wt Readings from Last 3 Encounters:  10/05/23 246 lb 6.4 oz (111.8 kg)  07/05/23 247 lb (112 kg)  05/15/23 247 lb (112 kg)      Physical Exam Vitals and nursing note reviewed.  Constitutional:      Appearance: Normal appearance.  HENT:     Head: Normocephalic.  Eyes:     Conjunctiva/sclera: Conjunctivae normal.  Cardiovascular:     Rate and Rhythm: Normal rate and regular rhythm.     Pulses: Normal pulses.     Heart sounds:  Normal heart sounds.  Pulmonary:     Effort: Pulmonary effort is normal.     Breath sounds: Normal breath sounds.  Musculoskeletal:     Cervical back: Normal range of motion.  Skin:    General: Skin is warm.  Neurological:     General: No focal deficit present.     Mental Status: He is alert and oriented to person, place, and time.  Psychiatric:        Mood and Affect: Mood normal.        Behavior: Behavior normal.        Thought Content: Thought content normal.        Judgment: Judgment normal.    The 10-year ASCVD risk score (Arnett DK, et al., 2019) is: 25.5%    Assessment & Plan:   Problem List Items Addressed This Visit       Cardiovascular and Mediastinum   Essential hypertension - Primary   Blood pressure is variable with recent improvement. Stress and medical issues may affect control. Current regimen includes metoprolol  50mg  BID and triamterene -hctz 37.5-25mg  daily. Continue checking blood pressure at home. Encourage adherence to evening medication dosing to improve blood pressure control. Check CMP, CBC today. EKG showed normal sinus rhythm with a heart rate of 61, no ST or  T wave changes.       Relevant Orders   CBC with Differential/Platelet   Comprehensive metabolic panel with GFR   EKG 87-Ozji (Completed)     Other   Hyperlipidemia   Chronic, stable. Continue simvastatin  40mg  daily. Check CMP, CBC, lipid panel today and treat based on results.       Relevant Orders   CBC with Differential/Platelet   Comprehensive metabolic panel with GFR   Lipid panel   Chronic neck pain   Chronic, ongoing. 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 Reviewed Dr. Thayer notes. Surgery is recommended to prevent neurological damage and potential paralysis. He is following with Dr. Colon and will most likely schedule surgery soon. Continue lyrica  75mg  daily and tramadol  50mg  q12 hour prn pain. Follow-up in 3 months.        Prediabetes   Chronic, stable. Check A1c today and treat based on results.       Relevant Orders   Hemoglobin A1c   Other Visit Diagnoses       Immunization due       Flu vaccine given today   Relevant Orders   Flu vaccine HIGH DOSE PF(Fluzone Trivalent) (Completed)       Return in about 3 months (around 01/04/2024) for CPE.    Tinnie DELENA Harada, NP

## 2023-10-05 NOTE — Assessment & Plan Note (Signed)
Chronic, stable. Check A1c today and treat based on results.

## 2023-10-05 NOTE — Patient Instructions (Signed)
 It was great to see you!  We are checking your labs today and will let you know the results via mychart/phone.   Start checking your blood pressure at home   Let's follow-up in 3 months, sooner if you have concerns.  If a referral was placed today, you will be contacted for an appointment. Please note that routine referrals can sometimes take up to 3-4 weeks to process. Please call our office if you haven't heard anything after this time frame.  Take care,  Tinnie Harada, NP

## 2023-10-05 NOTE — Assessment & Plan Note (Signed)
 Blood pressure is variable with recent improvement. Stress and medical issues may affect control. Current regimen includes metoprolol  50mg  BID and triamterene -hctz 37.5-25mg  daily. Continue checking blood pressure at home. Encourage adherence to evening medication dosing to improve blood pressure control. Check CMP, CBC today. EKG showed normal sinus rhythm with a heart rate of 61, no ST or T wave changes.

## 2023-10-05 NOTE — Assessment & Plan Note (Addendum)
 Chronic, ongoing. 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 Reviewed Dr. Thayer notes. Surgery is recommended to prevent neurological damage and potential paralysis. He is following with Dr. Colon and will most likely schedule surgery soon. Continue lyrica  75mg  daily and tramadol  50mg  q12 hour prn pain. Follow-up in 3 months.

## 2023-10-05 NOTE — Progress Notes (Signed)
 EKG interpreted by me on 10/05/23 showed normal sinus rhythm with a heart rate of 61. No ST or T wave changes.

## 2023-10-24 DIAGNOSIS — M4722 Other spondylosis with radiculopathy, cervical region: Secondary | ICD-10-CM | POA: Diagnosis not present

## 2023-10-24 DIAGNOSIS — M5126 Other intervertebral disc displacement, lumbar region: Secondary | ICD-10-CM | POA: Diagnosis not present

## 2023-10-24 DIAGNOSIS — M50121 Cervical disc disorder at C4-C5 level with radiculopathy: Secondary | ICD-10-CM | POA: Diagnosis not present

## 2023-11-06 ENCOUNTER — Other Ambulatory Visit: Payer: Self-pay | Admitting: Nurse Practitioner

## 2023-11-06 NOTE — Telephone Encounter (Signed)
 Requesting: SIMVASTATIN  40 MG TABLET  Last Visit: 10/05/2023 Next Visit: 01/03/2024 Last Refill: 08/09/2023  Please Advise

## 2023-11-17 DIAGNOSIS — G959 Disease of spinal cord, unspecified: Secondary | ICD-10-CM | POA: Diagnosis not present

## 2023-12-09 ENCOUNTER — Other Ambulatory Visit: Payer: Self-pay | Admitting: Nurse Practitioner

## 2023-12-11 NOTE — Telephone Encounter (Signed)
 Requesting: TRAMADOL  HCL 50 MG TABLET  Last Visit: 10/05/2023 Next Visit: 01/03/2024 Last Refill: 10/03/2023  Please Advise

## 2023-12-29 ENCOUNTER — Other Ambulatory Visit: Payer: Self-pay | Admitting: Nurse Practitioner

## 2023-12-29 DIAGNOSIS — K2100 Gastro-esophageal reflux disease with esophagitis, without bleeding: Secondary | ICD-10-CM

## 2023-12-29 DIAGNOSIS — I10 Essential (primary) hypertension: Secondary | ICD-10-CM

## 2024-01-01 NOTE — Telephone Encounter (Signed)
 Requesting: METOPROLOL  TARTRATE 50 MG TAB , OMEPRAZOLE  DR 20 MG CAPSULE  Last Visit: 10/05/2023 Next Visit: 01/03/2024 Last Refill: 01/02/2023,   Please Advise

## 2024-01-02 ENCOUNTER — Other Ambulatory Visit: Payer: Self-pay | Admitting: Nurse Practitioner

## 2024-01-02 NOTE — Telephone Encounter (Signed)
 Requesting: TRIAMTERENE -HCTZ 37.5-25 MG TB  Last Visit: 10/05/2023 Next Visit: 01/03/2024 Last Refill: 01/02/2023  Please Advise

## 2024-01-03 ENCOUNTER — Ambulatory Visit: Admitting: Nurse Practitioner

## 2024-01-03 ENCOUNTER — Encounter: Payer: Self-pay | Admitting: Nurse Practitioner

## 2024-01-03 VITALS — BP 142/78 | HR 82 | Temp 97.0°F | Ht 72.0 in | Wt 241.6 lb

## 2024-01-03 DIAGNOSIS — F419 Anxiety disorder, unspecified: Secondary | ICD-10-CM | POA: Diagnosis not present

## 2024-01-03 DIAGNOSIS — G8929 Other chronic pain: Secondary | ICD-10-CM

## 2024-01-03 DIAGNOSIS — E669 Obesity, unspecified: Secondary | ICD-10-CM

## 2024-01-03 DIAGNOSIS — N400 Enlarged prostate without lower urinary tract symptoms: Secondary | ICD-10-CM

## 2024-01-03 DIAGNOSIS — R7303 Prediabetes: Secondary | ICD-10-CM | POA: Diagnosis not present

## 2024-01-03 DIAGNOSIS — Z0000 Encounter for general adult medical examination without abnormal findings: Secondary | ICD-10-CM

## 2024-01-03 DIAGNOSIS — I10 Essential (primary) hypertension: Secondary | ICD-10-CM | POA: Diagnosis not present

## 2024-01-03 DIAGNOSIS — E782 Mixed hyperlipidemia: Secondary | ICD-10-CM | POA: Diagnosis not present

## 2024-01-03 DIAGNOSIS — M542 Cervicalgia: Secondary | ICD-10-CM

## 2024-01-03 DIAGNOSIS — F32A Depression, unspecified: Secondary | ICD-10-CM

## 2024-01-03 LAB — POCT GLYCOSYLATED HEMOGLOBIN (HGB A1C)
HbA1c POC (<> result, manual entry): 6.3 % (ref 4.0–5.6)
HbA1c, POC (controlled diabetic range): 6.3 % (ref 0.0–7.0)
HbA1c, POC (prediabetic range): 6.3 % (ref 5.7–6.4)
Hemoglobin A1C: 6.3 % — AB (ref 4.0–5.6)

## 2024-01-03 MED ORDER — METFORMIN HCL ER 500 MG PO TB24: 500.0000 mg | ORAL_TABLET | Freq: Every day | ORAL | 1 refills | Status: AC

## 2024-01-03 NOTE — Assessment & Plan Note (Signed)
 Chronic, ongoing. A1c has improved to 6.3%. With sugars still elevated, will start metformin XR 500mg  daily in addition to ongoing exercise and limiting sugars. Follow-up in 3 months.

## 2024-01-03 NOTE — Assessment & Plan Note (Signed)
 BMI 32.7. Discussed exercise, nutrition.

## 2024-01-03 NOTE — Assessment & Plan Note (Signed)
 Chronic, stable. Continue flomax  0.4mg  daily and proscar 5mg  daily.

## 2024-01-03 NOTE — Assessment & Plan Note (Signed)
 Chronic, stable.  He is doing well on Wellbutrin XL 300 mg daily and sertraline 50 mg daily.  Will continue this regimen.

## 2024-01-03 NOTE — Assessment & Plan Note (Signed)
 Chronic, ongoing. He has only been taking metoprolol  once a day, encouraged him to take this twice a day as prescribed. Continue metoprolol  50mg  BID and triamterene -hctz 37.5-25mg  daily. Follow-up in 3 months.

## 2024-01-03 NOTE — Assessment & Plan Note (Signed)
Chronic, stable. Continue simvastatin 40mg daily.  

## 2024-01-03 NOTE — Progress Notes (Signed)
 BP (!) 142/78 (BP Location: Left Arm, Cuff Size: Large)   Pulse 82   Temp (!) 97 F (36.1 C)   Ht 6' (1.829 m)   Wt 241 lb 9.6 oz (109.6 kg)   SpO2 99%   BMI 32.77 kg/m    Subjective:    Patient ID: Antonio Cooper, male    DOB: 03/07/1955, 68 y.o.   MRN: [id number]  CC: Chief Complaint  Patient presents with   Annual Exam    With fasting labs, no concerns    HPI: Antonio Cooper is a 68 y.o. male presenting on 01/03/2024 for comprehensive medical examination. Current medical complaints include:none  Depression and Anxiety Screen done today and results listed below:     01/03/2024   10:43 AM 10/05/2023    2:03 PM 07/05/2023    1:33 PM 04/03/2023    2:31 PM 01/02/2023   10:15 AM  Depression screen PHQ 2/9  Decreased Interest 0 0 0 0 0  Down, Depressed, Hopeless 0 0 0 0 0  PHQ - 2 Score 0 0 0 0 0  Altered sleeping 0 2 0 0 0  Tired, decreased energy 0 2 0 0 0  Change in appetite 0 2 0 0 0  Feeling bad or failure about yourself  0 0 0 0 0  Trouble concentrating 0 2 1 0 0  Moving slowly or fidgety/restless 0 0 0 0 0  Suicidal thoughts 0 0 0 0 0  PHQ-9 Score 0 8  1  0  0   Difficult doing work/chores Not difficult at all Not difficult at all Not difficult at all Not difficult at all Not difficult at all     Data saved with a previous flowsheet row definition      01/03/2024   10:43 AM 10/05/2023    2:03 PM 07/05/2023    1:33 PM 01/02/2023   10:15 AM  GAD 7 : Generalized Anxiety Score  Nervous, Anxious, on Edge 0 1 1 0  Control/stop worrying 0 1 0 0  Worry too much - different things 0 1 1 0  Trouble relaxing 0 1 0 0  Restless 0 1 0 0  Easily annoyed or irritable 0 1 0 0  Afraid - awful might happen 0 1 0 0  Total GAD 7 Score 0 7 2 0  Anxiety Difficulty Not difficult at all Not difficult at all Not difficult at all     The patient does not have a history of falls. I did not complete a risk assessment for falls. A plan of care for falls was not documented.   Past Medical  History:  Past Medical History:  Diagnosis Date   Allergy    Anxiety    BPH (benign prostatic hyperplasia)    Bulging lumbar disc    Cataract    Colon polyps    Depression    GERD (gastroesophageal reflux disease)    Hyperlipidemia    Hypertension    Spinal stenosis     Surgical History:  Past Surgical History:  Procedure Laterality Date   broken nose   1974   COLONOSCOPY     EYE SURGERY     FRACTURE SURGERY     I & D EXTREMITY Left 12/18/2012   Procedure: I&D Left Middle Finger With Repair Recons.Prn;  Surgeon: Elsie Mussel, MD;  Location: [facility] OR;  Service: Orthopedics;  Laterality: Left;   I & D EXTREMITY Left 12/20/2012   Procedure: LEFT HAND  Incision and drainage  TO INCLUDE MIDDLE FINGER FLEXOR TENDON;  Surgeon: Elsie Mussel, MD;  Location: [facility] OR;  Service: Orthopedics;  Laterality: Left;   I & D EXTREMITY Left 12/22/2012   Procedure: IRRIGATION AND DEBRIDEMENT HAND;  Surgeon: Elsie Mussel, MD;  Location: [facility] OR;  Service: Orthopedics;  Laterality: Left;   REFRACTIVE SURGERY     TYMPANIC MEMBRANE REPAIR     WISDOM TOOTH EXTRACTION      Medications:  Current Outpatient Medications on File Prior to Visit  Medication Sig   buPROPion  (WELLBUTRIN  XL) 300 MG 24 hr tablet Take 1 tablet (300 mg total) by mouth daily.   cetirizine  (ZYRTEC ) 10 MG tablet Take 1 tablet (10 mg total) by mouth daily.   Emollient (CERAVE MOISTURIZING) CREA See admin instructions.   finasteride (PROSCAR) 5 MG tablet Take 5 mg by mouth daily.   methocarbamol  (ROBAXIN ) 500 MG tablet Take 1 tablet (500 mg total) by mouth every 8 (eight) hours as needed for muscle spasms.   metoprolol  tartrate (LOPRESSOR ) 50 MG tablet TAKE 1 TABLET BY MOUTH TWICE A DAY   Multiple Vitamins-Minerals (MULTIVITAMIN WITH MINERALS) tablet Take by mouth.   omeprazole  (PRILOSEC) 20 MG capsule TAKE 1 CAPSULE BY MOUTH EVERY DAY   sertraline  (ZOLOFT ) 50 MG tablet Take 1 tablet (50 mg total) by mouth daily.   simvastatin   (ZOCOR ) 40 MG tablet TAKE 1 TABLET BY MOUTH EVERYDAY AT BEDTIME   tamsulosin  (FLOMAX ) 0.4 MG CAPS capsule Take 1 capsule (0.4 mg total) by mouth daily.   traMADol  (ULTRAM ) 50 MG tablet TAKE 1 TABLET BY MOUTH EVERY 12 HOURS AS NEEDED   triamcinolone  cream (KENALOG ) 0.1 %    triamterene -hydrochlorothiazide  (MAXZIDE -25) 37.5-25 MG tablet TAKE 1 TABLET BY MOUTH EVERY DAY   [employer] SUNSCREEN SPF 30 EX apply topically to face and body daily for 30 (Patient not taking: Reported on 01/03/2024)   No current facility-administered medications on file prior to visit.    Allergies:  No Known Allergies  Social History:  Social History   Socioeconomic History   Marital status: Single    Spouse name: Not on file   Number of children: 2   Years of education: Not on file   Highest education level: Bachelor's degree (e.g., BA, AB, BS)  Occupational History   Occupation: CFO  Tobacco Use   Smoking status: Former    Current packs/day: 0.00    Types: Cigarettes    Start date: 23    Quit date: 2000    Years since quitting: 25.9   Smokeless tobacco: Never  Vaping Use   Vaping status: Never Used  Substance and Sexual Activity   Alcohol use: Yes    Comment: social   Drug use: No   Sexual activity: Yes  Other Topics Concern   Not on file  Social History Narrative   Not on file   Social Drivers of Health   Financial Resource Strain: Low Risk  (09/28/2023)   Overall Financial Resource Strain (CARDIA)    Difficulty of Paying Living Expenses: Not very hard  Food Insecurity: No Food Insecurity (09/28/2023)   Hunger Vital Sign    Worried About Running Out of Food in the Last Year: Never true    Ran Out of Food in the Last Year: Never true  Transportation Needs: No Transportation Needs (09/28/2023)   PRAPARE - Administrator, Civil Service (Medical): No    Lack of Transportation (Non-Medical): No  Physical Activity: Insufficiently Active (09/28/2023)  Exercise Vital Sign    Days of  Exercise per Week: 3 days    Minutes of Exercise per Session: 30 min  Stress: No Stress Concern Present (09/28/2023)   [employer] of Occupational Health - Occupational Stress Questionnaire    Feeling of Stress: Only a little  Social Connections: Socially Isolated (09/28/2023)   Social Connection and Isolation Panel    Frequency of Communication with Friends and Family: Once a week    Frequency of Social Gatherings with Friends and Family: Once a week    Attends Religious Services: 1 to 4 times per year    Active Member of [employer] or Organizations: No    Attends Engineer, Structural: Not on file    Marital Status: Divorced  Intimate Partner Violence: Not At Risk (04/03/2023)   Humiliation, Afraid, Rape, and Kick questionnaire    Fear of Current or Ex-Partner: No    Emotionally Abused: No    Physically Abused: No    Sexually Abused: No   Social History   Tobacco Use  Smoking Status Former   Current packs/day: 0.00   Types: Cigarettes   Start date: 71   Quit date: 2000   Years since quitting: 25.9  Smokeless Tobacco Never   Social History   Substance and Sexual Activity  Alcohol Use Yes   Comment: social    Family History:  Family History  Problem Relation Age of Onset   Kidney cancer Mother 70       Deceased   Kidney disease Mother    Cancer Mother    Hypertension Father 65       Deceased   Neuropathy Father    Kidney disease Father    Breast cancer Sister    Cancer Brother        stomach cancer   Diabetes Brother    Healthy Brother        x1   Healthy Daughter        x2   Kidney cancer Maternal Grandmother    Cancer Other        Aunts & Uncles   Colon cancer Other    Cancer Sister    Esophageal cancer Neg Hx    Rectal cancer Neg Hx    Stomach cancer Neg Hx     Past medical history, surgical history, medications, allergies, family history and social history reviewed with patient today and changes made to appropriate areas of the chart.    ROS All other ROS negative except what is listed above and in the HPI.      Objective:    BP (!) 142/78 (BP Location: Left Arm, Cuff Size: Large)   Pulse 82   Temp (!) 97 F (36.1 C)   Ht 6' (1.829 m)   Wt 241 lb 9.6 oz (109.6 kg)   SpO2 99%   BMI 32.77 kg/m   Wt Readings from Last 3 Encounters:  01/03/24 241 lb 9.6 oz (109.6 kg)  10/05/23 246 lb 6.4 oz (111.8 kg)  07/05/23 247 lb (112 kg)    Physical Exam Vitals and nursing note reviewed.  Constitutional:      General: He is not in acute distress.    Appearance: Normal appearance.  HENT:     Head: Normocephalic and atraumatic.     Right Ear: Tympanic membrane, ear canal and external ear normal.     Left Ear: Tympanic membrane, ear canal and external ear normal.     Mouth/Throat:  Mouth: Mucous membranes are moist.     Pharynx: No posterior oropharyngeal erythema.  Eyes:     Conjunctiva/sclera: Conjunctivae normal.  Cardiovascular:     Rate and Rhythm: Normal rate and regular rhythm.     Pulses: Normal pulses.     Heart sounds: Normal heart sounds.  Pulmonary:     Effort: Pulmonary effort is normal.     Breath sounds: Normal breath sounds.  Abdominal:     Palpations: Abdomen is soft.     Tenderness: There is no abdominal tenderness.  Musculoskeletal:        General: Normal range of motion.     Cervical back: Normal range of motion and neck supple. No tenderness.     Right lower leg: No edema.     Left lower leg: No edema.  Lymphadenopathy:     Cervical: No cervical adenopathy.  Skin:    General: Skin is warm and dry.  Neurological:     General: No focal deficit present.     Mental Status: He is alert and oriented to person, place, and time.     Cranial Nerves: No cranial nerve deficit.     Coordination: Coordination normal.     Gait: Gait normal.  Psychiatric:        Mood and Affect: Mood normal.        Behavior: Behavior normal.        Thought Content: Thought content normal.        Judgment:  Judgment normal.     Results for orders placed or performed in visit on 01/03/24  POCT glycosylated hemoglobin (Hb A1C)   Collection Time: 01/03/24 11:16 AM  Result Value Ref Range   Hemoglobin A1C 6.3 (A) 4.0 - 5.6 %   HbA1c POC (<> result, manual entry) 6.3 4.0 - 5.6 %   HbA1c, POC (prediabetic range) 6.3 5.7 - 6.4 %   HbA1c, POC (controlled diabetic range) 6.3 0.0 - 7.0 %      Assessment & Plan:   Problem List Items Addressed This Visit       Cardiovascular and Mediastinum   Essential hypertension   Chronic, ongoing. He has only been taking metoprolol  once a day, encouraged him to take this twice a day as prescribed. Continue metoprolol  50mg  BID and triamterene -hctz 37.5-25mg  daily. Follow-up in 3 months.         Genitourinary   BPH (benign prostatic hyperplasia)   Chronic, stable. Continue flomax  0.4mg  daily and proscar 5mg  daily.         Other   Hyperlipidemia   Chronic, stable. Continue simvastatin  40mg  daily.       Anxiety and depression   Chronic, stable.  He is doing well on Wellbutrin  XL 300 mg daily and sertraline  50 mg daily.  Will continue this regimen.       Chronic neck pain   Chronic, stable. Pain and tingling in hands have improved since surgery. He was able to stop lyrica . Follow-up with any concerns.       Obesity (BMI 30-39.9)   BMI 32.7. Discussed exercise, nutrition.       Relevant Medications   metFORMIN (GLUCOPHAGE-XR) 500 MG 24 hr tablet   Routine general medical examination at a health care facility - Primary   Health maintenance reviewed and updated. Discussed nutrition, exercise. Reviewed recent labs. Follow-up 1 year.        Prediabetes   Chronic, ongoing. A1c has improved to 6.3%. With sugars still elevated, will start metformin  XR 500mg  daily in addition to ongoing exercise and limiting sugars. Follow-up in 3 months.       Relevant Orders   POCT glycosylated hemoglobin (Hb A1C) (Completed)    IMMUNIZATIONS:   - Tdap:  Tetanus vaccination status reviewed: last tetanus booster within 10 years. - Influenza: Up to date - Pneumovax: Not applicable - Prevnar: Up to date - HPV: Not applicable - Shingrix vaccine: Up to date  SCREENING: - Colonoscopy: Up to date  Discussed with patient purpose of the colonoscopy is to detect colon cancer at curable precancerous or early stages   - AAA Screening: Not applicable   PATIENT COUNSELING:    Sexuality: Discussed sexually transmitted diseases, partner selection, use of condoms, avoidance of unintended pregnancy  and contraceptive alternatives.   Advised to avoid cigarette smoking.  I discussed with the patient that most people either abstain from alcohol or drink within safe limits (<=14/week and <=4 drinks/occasion for males, <=7/weeks and <= 3 drinks/occasion for females) and that the risk for alcohol disorders and other health effects rises proportionally with the number of drinks per week and how often a drinker exceeds daily limits.  Discussed cessation/primary prevention of drug use and availability of treatment for abuse.   Diet: Encouraged to adjust caloric intake to maintain  or achieve ideal body weight, to reduce intake of dietary saturated fat and total fat, to limit sodium intake by avoiding high sodium foods and not adding table salt, and to maintain adequate dietary potassium and calcium  preferably from fresh fruits, vegetables, and low-fat dairy products.    stressed the importance of regular exercise  Injury prevention: Discussed safety belts, safety helmets, smoke detector, smoking near bedding or upholstery.   Dental health: Discussed importance of regular tooth brushing, flossing, and dental visits.   Follow up plan: NEXT PREVENTATIVE PHYSICAL DUE IN 1 YEAR. Return in about 3 months (around 04/02/2024) for HTN, prediabetes.  Elanora Quin A Orlan [employer]

## 2024-01-03 NOTE — Patient Instructions (Signed)
 It was great to see you!  Keep up the great work!   Start metformin  once a day to help with sugars with food   Start taking the metoprolol  twice a day to help with the blood pressure   Let's follow-up in 3 months, sooner if you have concerns.  If a referral was placed today, you will be contacted for an appointment. Please note that routine referrals can sometimes take up to 3-4 weeks to process. Please call our office if you haven't heard anything after this time frame.  Take care,  Tinnie Harada, NP

## 2024-01-03 NOTE — Assessment & Plan Note (Signed)
 Health maintenance reviewed and updated. Discussed nutrition, exercise. Reviewed recent labs. Follow-up 1 year.

## 2024-01-03 NOTE — Assessment & Plan Note (Signed)
 Chronic, stable. Pain and tingling in hands have improved since surgery. He was able to stop lyrica . Follow-up with any concerns.

## 2024-01-04 ENCOUNTER — Encounter: Payer: Self-pay | Admitting: Nurse Practitioner

## 2024-01-06 ENCOUNTER — Other Ambulatory Visit: Payer: Self-pay | Admitting: Nurse Practitioner

## 2024-01-06 DIAGNOSIS — F419 Anxiety disorder, unspecified: Secondary | ICD-10-CM

## 2024-01-08 ENCOUNTER — Other Ambulatory Visit: Payer: Self-pay | Admitting: Medical Genetics

## 2024-01-08 NOTE — Telephone Encounter (Signed)
 Requesting: SERTRALINE  HCL 50 MG TABLET  Last Visit: 01/03/2024 Next Visit: 04/02/2024 Last Refill: 01/02/2023  Please Advise

## 2024-01-26 ENCOUNTER — Other Ambulatory Visit: Payer: Self-pay | Admitting: Nurse Practitioner

## 2024-01-26 NOTE — Telephone Encounter (Signed)
 Requesting: SIMVASTATIN  40 MG TABLET  Last Visit: 01/03/2024 Next Visit: 04/02/2024 Last Refill: 11/06/2023  Please Advise

## 2024-01-27 ENCOUNTER — Other Ambulatory Visit: Payer: Self-pay | Admitting: Nurse Practitioner

## 2024-01-27 DIAGNOSIS — F32A Depression, unspecified: Secondary | ICD-10-CM

## 2024-01-29 NOTE — Telephone Encounter (Signed)
 Requesting: BUPROPION  HCL XL 300 MG TABLET  Last Visit: 01/03/2024 Next Visit: 04/02/2024 Last Refill: 01/02/2023  Please Advise

## 2024-02-22 ENCOUNTER — Other Ambulatory Visit

## 2024-02-22 DIAGNOSIS — Z006 Encounter for examination for normal comparison and control in clinical research program: Secondary | ICD-10-CM

## 2024-03-01 LAB — GENECONNECT MOLECULAR SCREEN: Genetic Analysis Overall Interpretation: NEGATIVE

## 2024-03-07 ENCOUNTER — Encounter: Payer: Self-pay | Admitting: Nurse Practitioner

## 2024-04-02 ENCOUNTER — Ambulatory Visit: Admitting: Nurse Practitioner

## 2024-04-05 ENCOUNTER — Ambulatory Visit

## 2024-04-08 ENCOUNTER — Ambulatory Visit
